# Patient Record
Sex: Female | Born: 1955 | Race: White | Hispanic: No | Smoking: Never smoker
Health system: Southern US, Community
[De-identification: ages and names within clinical notes are randomized; demographics above are authoritative.]

## PROBLEM LIST (undated history)

## (undated) DIAGNOSIS — N289 Disorder of kidney and ureter, unspecified: Secondary | ICD-10-CM

## (undated) DIAGNOSIS — R569 Unspecified convulsions: Secondary | ICD-10-CM

## (undated) DIAGNOSIS — M25551 Pain in right hip: Secondary | ICD-10-CM

## (undated) DIAGNOSIS — I10 Essential (primary) hypertension: Secondary | ICD-10-CM

## (undated) DIAGNOSIS — E119 Type 2 diabetes mellitus without complications: Secondary | ICD-10-CM

## (undated) DIAGNOSIS — Z8489 Family history of other specified conditions: Secondary | ICD-10-CM

## (undated) DIAGNOSIS — I639 Cerebral infarction, unspecified: Secondary | ICD-10-CM

## (undated) DIAGNOSIS — G8929 Other chronic pain: Secondary | ICD-10-CM

---

## 2017-04-25 ENCOUNTER — Encounter (HOSPITAL_COMMUNITY): Payer: Self-pay | Admitting: Emergency Medicine

## 2017-04-25 ENCOUNTER — Emergency Department (HOSPITAL_COMMUNITY): Payer: Medicaid Other

## 2017-04-25 ENCOUNTER — Telehealth: Payer: Self-pay

## 2017-04-25 ENCOUNTER — Emergency Department (HOSPITAL_COMMUNITY)
Admission: EM | Admit: 2017-04-25 | Discharge: 2017-04-25 | Disposition: A | Discharge status: Home / Self Care | Payer: Medicaid Other | Attending: Emergency Medicine | Admitting: Emergency Medicine

## 2017-04-25 DIAGNOSIS — J45909 Unspecified asthma, uncomplicated: Secondary | ICD-10-CM | POA: Diagnosis not present

## 2017-04-25 DIAGNOSIS — R519 Headache, unspecified: Secondary | ICD-10-CM

## 2017-04-25 DIAGNOSIS — R51 Headache: Secondary | ICD-10-CM | POA: Diagnosis not present

## 2017-04-25 DIAGNOSIS — I10 Essential (primary) hypertension: Secondary | ICD-10-CM | POA: Diagnosis not present

## 2017-04-25 DIAGNOSIS — F0390 Unspecified dementia without behavioral disturbance: Secondary | ICD-10-CM | POA: Insufficient documentation

## 2017-04-25 DIAGNOSIS — Z853 Personal history of malignant neoplasm of breast: Secondary | ICD-10-CM | POA: Diagnosis not present

## 2017-04-25 DIAGNOSIS — E119 Type 2 diabetes mellitus without complications: Secondary | ICD-10-CM | POA: Insufficient documentation

## 2017-04-25 LAB — COMPREHENSIVE METABOLIC PANEL
ALT: 14 U/L (ref 14–54)
AST: 24 U/L (ref 15–41)
Albumin: 4.3 g/dL (ref 3.5–5.0)
Alkaline Phosphatase: 75 U/L (ref 38–126)
Anion gap: 8 (ref 5–15)
BUN: 17 mg/dL (ref 6–20)
CHLORIDE: 104 mmol/L (ref 101–111)
CO2: 27 mmol/L (ref 22–32)
Calcium: 9.2 mg/dL (ref 8.9–10.3)
Creatinine, Ser: 0.73 mg/dL (ref 0.44–1.00)
GFR calc Af Amer: >60 mL/min (ref >60)
GFR calc non Af Amer: >60 mL/min (ref >60)
GLUCOSE: 81 mg/dL (ref 65–99)
POTASSIUM: 3.6 mmol/L (ref 3.5–5.1)
Sodium: 139 mmol/L (ref 135–145)
Total Bilirubin: 0.8 mg/dL (ref 0.3–1.2)
Total Protein: 7.6 g/dL (ref 6.5–8.1)

## 2017-04-25 LAB — CBC WITH DIFFERENTIAL/PLATELET
Basophils Absolute: 0.1 10*3/uL (ref 0.0–0.1)
Basophils Relative: 1 %
EOS PCT: 10 %
Eosinophils Absolute: 0.7 10*3/uL (ref 0.0–0.7)
HCT: 41.5 % (ref 36.0–46.0)
Hemoglobin: 14.7 g/dL (ref 12.0–15.0)
LYMPHS ABS: 1.8 10*3/uL (ref 0.7–4.0)
LYMPHS PCT: 26 %
MCH: 33.4 pg (ref 26.0–34.0)
MCHC: 35.4 g/dL (ref 30.0–36.0)
MCV: 94.3 fL (ref 78.0–100.0)
MONO ABS: 0.6 10*3/uL (ref 0.1–1.0)
MONOS PCT: 9 %
Neutro Abs: 3.6 10*3/uL (ref 1.7–7.7)
Neutrophils Relative %: 54 %
PLATELETS: 240 10*3/uL (ref 150–400)
RBC: 4.4 MIL/uL (ref 3.87–5.11)
RDW: 12.4 % (ref 11.5–15.5)
WBC: 6.8 10*3/uL (ref 4.0–10.5)

## 2017-04-25 LAB — SEDIMENTATION RATE: Sed Rate: 34 mm/hr — ABNORMAL HIGH (ref 0–22)

## 2017-04-25 MED ORDER — DIPHENHYDRAMINE HCL 50 MG/ML IJ SOLN
25.0000 mg | Freq: Once | INTRAMUSCULAR | Status: AC
  Administered 2017-04-25: 25 mg via INTRAVENOUS
  Filled 2017-04-25: qty 1

## 2017-04-25 MED ORDER — METOCLOPRAMIDE HCL 5 MG/ML IJ SOLN
10.0000 mg | INTRAMUSCULAR | Status: AC
  Administered 2017-04-25: 10 mg via INTRAVENOUS
  Filled 2017-04-25: qty 2

## 2017-04-25 NOTE — ED Provider Notes (Signed)
[facility] DEPT Provider Note   CSN: [id number] Arrival date & time: 04/25/17  0846     History   Chief Complaint Chief Complaint  Patient presents with  . Headache    HPI Lacey Cook is a 61 y.o. female.  61yo F w/ PMH including HTN, HLD, T2DM, asthma, breast cancer who p/w headache.  Patient states that she has been having intermittent headaches in her bilateral temples for the past week.  She has not noticed a pattern to the headaches.  She had an episode of vomiting 1 week ago.  She denies any vision changes or extremity weakness.  She states her skin has been turning black on her legs recently.  She denies any fevers, cough/cold symptoms, or recent illness.  She moved here from [place] several months ago and has been off of all medications since then because she cannot afford them.  She was diagnosed with left-sided breast cancer in [place] but never received treatment and has not yet established care with anyone here.  She lives with family here.  Daughter notes that she has a history of dementia.  She also states that the patient has complained of headaches in the past.  She confirms the patient has been off of her medications for a while.  LEVEL 5 CAVEAT DUE TO DEMENTIA   The history is provided by the patient and a relative.  Headache      History reviewed. No pertinent past medical history.  There are no active problems to display for this patient.   History reviewed. No pertinent surgical history.  OB History    No data available       Home Medications    Prior to Admission medications   Not on File    Family History History reviewed. No pertinent family history.  Social History Social History   Tobacco Use  . Smoking status: Never Smoker  . Smokeless tobacco: Never Used  Substance Use Topics  . Alcohol use: No    Frequency: Never  . Drug use: Not on file     Allergies   Sulfa antibiotics   Review of  Systems Review of Systems  Unable to perform ROS: Dementia  Neurological: Positive for headaches.     Physical Exam Updated Vital Signs BP 129/77   Pulse 88   Temp 97.8 F (36.6 C) (Oral)   Resp 16   SpO2 100%   Physical Exam  Constitutional: She appears well-developed and well-nourished. No distress.  Awake, alert  HENT:  Head: Normocephalic and atraumatic.  Eyes: Conjunctivae and EOM are normal. Pupils are equal, round, and reactive to light.  Neck: Neck supple.  Cardiovascular: Normal rate, regular rhythm and normal heart sounds.  No murmur heard. Pulmonary/Chest: Effort normal and breath sounds normal. No respiratory distress.  Abdominal: Soft. Bowel sounds are normal. She exhibits no distension. There is no tenderness.  Musculoskeletal: She exhibits edema (trace BLE).  Neurological: She is alert. She has normal reflexes. No cranial nerve deficit. She exhibits normal muscle tone.  Alert and oriented, occasionally contradicts herself during conversation and becomes confused with questions, Fluent speech, normal finger-to-nose testing, negative pronator drift, no clonus normal sensation x all 4 extremities 5/5 strength BUE 4/5 strength BLE  Skin: Skin is warm and dry.  No skin changes on legs  Psychiatric: Judgment and thought content normal.  Flat affect, calm and cooperative  Nursing note and vitals reviewed.    ED Treatments / Results  Labs (all  labs ordered are listed, but only abnormal results are displayed) Labs Reviewed  SEDIMENTATION RATE - Abnormal; Notable for the following components:      Result Value   Sed Rate 34 (*)    All other components within normal limits  COMPREHENSIVE METABOLIC PANEL  CBC WITH DIFFERENTIAL/PLATELET    EKG  EKG Interpretation None       Radiology Ct Head Wo Contrast  Result Date: 04/25/2017 CLINICAL DATA:  Bilateral temporal headache for the past week. Mild tremors. EXAM: CT HEAD WITHOUT CONTRAST TECHNIQUE:  Contiguous axial images were obtained from the base of the skull through the vertex without intravenous contrast. COMPARISON:  None. FINDINGS: Brain: No evidence of acute infarction, hemorrhage, extra-axial collection, ventriculomegaly, or mass effect. Generalized cerebral atrophy. Slightly increased left frontal lobe volume loss likely reflecting sequela prior infarct. Periventricular white matter low attenuation likely secondary to microangiopathy. Vascular: No focal abnormality.  No hyperdense vessels. Skull: Negative for fracture or focal lesion. Sinuses/Orbits: Visualized portions of the orbits are unremarkable. Visualized portions of the paranasal sinuses and mastoid air cells are unremarkable. Other: None. IMPRESSION: 1. No acute intracranial pathology. Electronically Signed   By: Kathreen Devoid   On: 04/25/2017 11:03    Procedures Procedures (including critical care time)  Medications Ordered in ED Medications  diphenhydrAMINE (BENADRYL) injection 25 mg (25 mg Intravenous Given 04/25/17 1059)  metoCLOPramide (REGLAN) injection 10 mg (10 mg Intravenous Given 04/25/17 1058)     Initial Impression / Assessment and Plan / ED Course  I have reviewed the triage vital signs and the nursing notes.  Pertinent labs & imaging results that were available during my care of the patient were reviewed by me and considered in my medical decision making (see chart for details).     PT w/ multiple medical problems including recent diagnosis of breast cancer without any treatment yet presents with intermittent headaches for the past week.  Her neurologic exam was normal with the exception of intermittent confusion and occasional contradictory statements during exam.  For example, I asked her if she had any problems with cholesterol or lung issues and she initially denied but then she later stated she had cholesterol problems and asthma.  I discussed with her daughter who confirms that this is consistent with  her known dementia.  I contacted case management to assist with getting the patient a primary care provider and to get her connected with the oncology team here. Case manager has obtained records from [place] and has arranged for PCP appointment this week.    CBC and CMP are unremarkable, ESR is elevated at 34. Given that ESR is <40 and pt has h/o headaches per daughter, I doubt temporal arteritis especially given complete resolution after migraine cocktail and no visual symptoms.   LAbwork unremarkable. Her head CT was negative for mass or other acute process and given her reassuring physical exam I feel she is safe for outpatient follow-up.  I have reviewed return precautions with the patient and her daughter.  Final Clinical Impressions(s) / ED Diagnoses   Final diagnoses:  Intermittent headache  Dementia without behavioral disturbance, unspecified dementia type    ED Discharge Orders    None       Averyanna Sax, Wenda Overland, MD 04/25/17 1640

## 2017-04-25 NOTE — ED Notes (Signed)
Patient transported to CT 

## 2017-04-25 NOTE — Telephone Encounter (Signed)
Call received from Haze Justin, RN CM requesting a hospital follow up appointment for the patient at [facility]. An appointment was scheduled for 04/28/17 @ 0945.  As per Levada Dy, the patient does not have a phone # and can be reached through her daughter, Suanne Marker.

## 2017-04-25 NOTE — Discharge Instructions (Signed)
1. Call [facility] for first available appointment for primary care doctor. 2. Call oncology clinic for next available appointment for  your breast cancer. 3. Return to ER if you have confusion, fever, vomiting, balance problems, weakness, or speech problems.

## 2017-04-25 NOTE — ED Triage Notes (Addendum)
Pt report she has a bilateral temporal HA for the past week accompanied by mild tremors when moving her hands. No head injury. No unilateral arm weakness or speech difficulties. Also having intermittent emesis with HA.

## 2017-04-25 NOTE — Care Management Note (Signed)
Case Management Note  Patient Details  Name: Lacey Cook MRN: [id number] Date of Birth: 04/09/1956  Subjective/Objective:                  60yo F w/ PMH including HTN, HLD, T2DM, asthma, TBI, seizures, CVA x2, breast cancer who p/w headache.  Action/Plan: CM consulted for no pcp and no ins on pt who needs multiple follow ups.  Pt is a poor historian with recall, but if a name or place is mentioned, pt can confirm or deny if it apply's to her.    CM spoke with pts daughter who states after multiple [facility] admissions in [place], she moved the pt in with her several months prior to moving to [place] this past July.  CM is unsure of why there was no continued care for the pt since at that time.  Pt's daughter stated she did not know who the pt's PCP was and offered multiple hospital systems the pt may have possibly been at.    CM spoke with pt who provided several old business cards for a DME company called [employer] and for [facility].  CM contacted both locations with release of information signed and was able to obtain pt's PCP name.  Pt confirmed that Gilberto Better, MD was her doctor at [facility].  CM contacted and faxed release of information to Dr. Randell Patient office.  [facility] is on Epic but care everywhere was not working at time of attempt.  CM obtained an appointment for pt Mon, Dec 3 at 9:45 am at the [facility].  Information placed and highlighted on AVS for pt.  Pt's daughter is aware, though she is being admitted to the hospital at this time.  CM Jane at [facility] has been updated on the pt and release of information obtained from other offices have been faxed to their office for the appointment on Monday.  CM contacted Seth Bake for a [facility] referral and was advised that if no information on the pt is available s/p oncology, the pt will have to start from the beginning of the process again through her PCP with a mammogram, then go  to a surgeon, prior to going to the cancer center.  This information was not provided to the pt due to CM being informed after pt was D/C.  Information for the on-call oncologist was placed on her AVS.  Expected Discharge Date:   04/25/2017               Expected Discharge Plan:  Home/Self Care  Discharge planning Services  CM Consult, [facility], Follow-up appt scheduled, Other - See comment  Choice offered to:  Patient, Adult Children  Status of Service:  Completed, signed off  TRUE Garciamartinez, Benjaman Lobe, RN 04/25/2017, 2:57 PM

## 2017-04-28 ENCOUNTER — Inpatient Hospital Stay: Payer: Self-pay | Admitting: Family Medicine

## 2017-04-28 ENCOUNTER — Telehealth: Payer: Self-pay

## 2017-04-28 NOTE — Telephone Encounter (Signed)
This note was opened in error.

## 2017-07-26 ENCOUNTER — Emergency Department (HOSPITAL_COMMUNITY)
Admission: EM | Admit: 2017-07-26 | Discharge: 2017-07-26 | Disposition: A | Discharge status: Home / Self Care | Payer: Medicaid Other | Attending: Emergency Medicine | Admitting: Emergency Medicine

## 2017-07-26 ENCOUNTER — Emergency Department (HOSPITAL_COMMUNITY): Payer: Medicaid Other

## 2017-07-26 ENCOUNTER — Encounter (HOSPITAL_COMMUNITY): Payer: Self-pay | Admitting: Emergency Medicine

## 2017-07-26 DIAGNOSIS — E119 Type 2 diabetes mellitus without complications: Secondary | ICD-10-CM | POA: Insufficient documentation

## 2017-07-26 DIAGNOSIS — R059 Cough, unspecified: Secondary | ICD-10-CM

## 2017-07-26 DIAGNOSIS — M25551 Pain in right hip: Secondary | ICD-10-CM | POA: Diagnosis not present

## 2017-07-26 DIAGNOSIS — K0889 Other specified disorders of teeth and supporting structures: Secondary | ICD-10-CM | POA: Diagnosis not present

## 2017-07-26 DIAGNOSIS — R05 Cough: Secondary | ICD-10-CM | POA: Diagnosis present

## 2017-07-26 DIAGNOSIS — I10 Essential (primary) hypertension: Secondary | ICD-10-CM | POA: Diagnosis not present

## 2017-07-26 DIAGNOSIS — R911 Solitary pulmonary nodule: Secondary | ICD-10-CM | POA: Diagnosis not present

## 2017-07-26 HISTORY — DX: Type 2 diabetes mellitus without complications: E11.9

## 2017-07-26 HISTORY — DX: Unspecified convulsions: R56.9

## 2017-07-26 HISTORY — DX: Cerebral infarction, unspecified: I63.9

## 2017-07-26 HISTORY — DX: Disorder of kidney and ureter, unspecified: N28.9

## 2017-07-26 HISTORY — DX: Essential (primary) hypertension: I10

## 2017-07-26 MED ORDER — ALBUTEROL SULFATE HFA 108 (90 BASE) MCG/ACT IN AERS
1.0000 | INHALATION_SPRAY | Freq: Once | RESPIRATORY_TRACT | Status: AC
  Administered 2017-07-26: 1 via RESPIRATORY_TRACT
  Filled 2017-07-26: qty 6.7

## 2017-07-26 MED ORDER — FLUTICASONE PROPIONATE 50 MCG/ACT NA SUSP: 1.0000 | Freq: Every day | NASAL | 2 refills | Status: DC

## 2017-07-26 MED ORDER — ACETAMINOPHEN 325 MG PO TABS
650.0000 mg | ORAL_TABLET | Freq: Once | ORAL | Status: AC
  Administered 2017-07-26: 650 mg via ORAL
  Filled 2017-07-26: qty 2

## 2017-07-26 MED ORDER — PENICILLIN V POTASSIUM 500 MG PO TABS: 500.0000 mg | ORAL_TABLET | Freq: Three times a day (TID) | ORAL | 0 refills | Status: AC

## 2017-07-26 MED ORDER — AEROCHAMBER PLUS FLO-VU MEDIUM MISC
1.0000 | Freq: Once | Status: AC
  Administered 2017-07-26: 1
  Filled 2017-07-26: qty 1

## 2017-07-26 MED ORDER — CETIRIZINE HCL 10 MG PO TABS: 10.0000 mg | ORAL_TABLET | Freq: Every day | ORAL | 0 refills | Status: DC

## 2017-07-26 NOTE — ED Notes (Signed)
Patient transported to X-ray 

## 2017-07-26 NOTE — ED Triage Notes (Signed)
Patient c/o upper dental pain x3 months. States she does not have a dentist. Patient also c/o right hip pain x3 years. Hx arthritis. Patient also reports non productive cough x2 weeks. Denies N/V/D. Reports being around other with similar sx.

## 2017-07-26 NOTE — Discharge Instructions (Signed)
Please read and follow all provided instructions.  Your diagnoses today include:  1. Cough   2. Pulmonary nodule   3. Pain, dental   4. Pain of right hip joint     You appear to have an upper respiratory infection (URI). An upper respiratory tract infection, or cold, is a viral infection of the air passages leading to the lungs. It should improve gradually after 5-7 days. You may have a lingering cough that lasts for 2- 4 weeks after the infection.  It is important that she follow-up with the [facility] regarding the nodule we noted on the chest x-ray of the left upper lung.  This will need to be imaged with a CAT scan.  This can be done outpatient.  I have Artie discussed with case management you following up with [facility].  Please call the number I listed in this paperwork first thing Monday morning to make your appointment.  Tests performed today include: Vital signs. See below for your results today.   Medications prescribed:   Take any prescribed medications only as directed. Treatment for your infection is aimed at treating the symptoms. There are no medications, such as antibiotics, that will cure your infection.   Home care instructions:  Follow any educational materials contained in this packet.   Your illness is contagious and can be spread to others, especially during the first 3 or 4 days. It cannot be cured by antibiotics or other medicines. Take basic precautions such as washing your hands often, covering your mouth when you cough or sneeze, and avoiding public places where you could spread your illness to others.   Please continue drinking plenty of fluids.  Use over-the-counter medicines as needed as directed on packaging for symptom relief.  You may also use ibuprofen or tylenol as directed on packaging for pain or fever.  Do not take multiple medicines containing Tylenol or acetaminophen to avoid taking too much of this  medication.  Follow-up instructions: Please follow-up with your primary care provider in the next 3 days for further evaluation of your symptoms if you are not feeling better.   Return instructions:  Please return to the Emergency Department if you experience worsening symptoms.  RETURN IMMEDIATELY IF you develop shortness of breath, chest pain, confusion or altered mental status, a new rash, become dizzy, faint, or poorly responsive, or are unable to be cared for at home. Please return if you have persistent vomiting and cannot keep down fluids or develop a fever that is not controlled by tylenol or motrin.   For your dental pain, please return the emergency department for any swelling of the face, difficulty breathing or swallowing, or a mass developing on the tooth that is concerning for abscess. Please return if you have any redness or swelling of your right hip suggestive of an infection.  Additional Information:  Your vital signs today were: BP 128/68    Pulse 74    Temp 98.1 F (36.7 C) (Oral)    Resp 18    SpO2 96%  If your blood pressure (BP) was elevated above 135/85 this visit, please have this repeated by your doctor within one month. --------------

## 2017-07-26 NOTE — ED Provider Notes (Signed)
[name] DEPT Provider Note   CSN: [id number] Arrival date & time: 07/26/17  1441     History   Chief Complaint Chief Complaint  Patient presents with  . Dental Pain  . Hip Pain  . Cough    HPI Lacey Cook is a 62 y.o. female.  HPI   Patient is a 62 year old female with a history of diabetes mellitus, hypertension, seizures, and CVA presenting for dental pain, right hip pain, and nonproductive cough for 2 weeks.  Patient presents with her granddaughter, who assists in history.  Patient reports that her memory is poor with her health problems, as she has had "2 strokes".   Cough:   Patient reports she has had a cough for approximately 2 weeks.  Patient reports it is nonproductive.  No hemoptysis.  Patient denies any fever or chills with her symptoms.  Patient does report she gets more short of breath while lying down, but is not particular short of breath associated with this cough.  Patient also reporting she has associated nasal congestion and rhinorrhea.  No remedies tried for symptoms.  Patient is not a smoker, and has never smoked, however has a history of being around secondhand smoke.  Dental Pain:   Patient reporting persistent, gradually worsening, right-sided, upper dental pain beginning 3 months ago. Pt describes their pain as  throbbing.  Pain is not particularly worsening in nature over this interval.  Pt has been taking Aleve at home with minimal relief of pain. Pain is exacerbated by chewing. They are followed by dentistry.  Pt denies facial swelling, fever, chills, difficulty breathing, difficulty swallowing.  Patient has upcoming dental appointment per daughter.  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  Patient reports that over approximately last 3 months she has had increasing pain in the right hip.  It is aching in nature, and patient finds it is most painful when she is trying to  get up or sit down.  Patient reports she has difficulty standing up due to the pain.  Patient reports it radiates from the buttock around to her hip.  Patient also reporting she has right knee pain worsening over the similar interval.  Patient denies any erythematous or edematous joints.  Patient denies fever or chills.  Past Medical History:  Diagnosis Date  . Diabetes mellitus without complication ([place])   . Hypertension   . Renal disorder   . Seizures ([place])   . Stroke [facility])     There are no active problems to display for this patient.   History reviewed. No pertinent surgical history.  OB History    No data available       Home Medications    Prior to Admission medications   Not on File    Family History No family history on file.  Social History Social History   Tobacco Use  . Smoking status: Never Smoker  . Smokeless tobacco: Never Used  Substance Use Topics  . Alcohol use: No    Frequency: Never  . Drug use: Not on file     Allergies   Sulfa antibiotics   Review of Systems Review of Systems  Constitutional: Negative for chills and fever.  HENT: Positive for congestion, dental problem and rhinorrhea. Negative for facial swelling, trouble swallowing and voice change.   Respiratory: Positive for cough and shortness of breath. Negative for chest tightness and stridor.   Cardiovascular: Negative for chest  pain and leg swelling.  Gastrointestinal: Negative for abdominal pain, nausea and vomiting.  Musculoskeletal: Positive for arthralgias and gait problem. Negative for joint swelling.  Neurological: Negative for weakness.     Physical Exam Updated Vital Signs BP 133/70 (BP Location: Left Arm)   Pulse 72   Temp 98.1 F (36.7 C) (Oral)   Resp 18   SpO2 96%   Physical Exam  Constitutional: She appears well-developed and well-nourished. No distress.  Resting comfortably in examination chair.  HENT:  Head: Normocephalic and atraumatic.    Mouth/Throat: Oropharynx is clear and moist.  Dental cavities and poor oral dentition noted.  Erosions and caries noted in the right upper molars.  Pain along tooth as depicted in image. No abscess noted. Midline uvula. No trismus. OP clear and moist. No oropharyngeal erythema or edema. Neck supple with no tenderness. No facial edema.  Eyes: Conjunctivae and EOM are normal. Pupils are equal, round, and reactive to light.  Neck: Normal range of motion. Neck supple.  No obvious masses palpated of supraclavicular region.  Cardiovascular: Normal rate, regular rhythm, S1 normal and S2 normal.  No murmur heard. Pulmonary/Chest: Effort normal and breath sounds normal. She has no wheezes. She has no rales.  Abdominal: She exhibits no distension.  Musculoskeletal: Normal range of motion. She exhibits no edema or deformity.  Examination of right hip performed with nurse chaperone present.  There is pain to palpation of the greater trochanter of the right hip.  Pain over sacroiliac joint.  No erythema or edema noted.  Lymphadenopathy:    She has no cervical adenopathy.  Neurological: She is alert.  Cranial nerves grossly intact. Patient moves extremities symmetrically and with good coordination. Antalgic gait with favoring the right.  Skin: Skin is warm and dry. No rash noted. No erythema.  Psychiatric:  Patient exhibiting memory difficulties.  Nursing note and vitals reviewed.    ED Treatments / Results  Labs (all labs ordered are listed, but only abnormal results are displayed) Labs Reviewed - No data to display  EKG  EKG Interpretation None       Radiology Dg Chest 2 View  Result Date: 07/26/2017 CLINICAL DATA:  Cough.  Right hip pain. EXAM: CHEST  2 VIEW COMPARISON:  None. FINDINGS: Normal cardiac and mediastinal contours. No consolidative pulmonary opacities. No pleural effusion or pneumothorax. Thoracic spine degenerative changes. Within the left upper hemithorax there is a 1.6 cm  nodular density which may potentially be pulmonary or osseous in etiology. IMPRESSION: Indeterminate 1.6 cm nodular density projecting within the left upper hemithorax, potentially pulmonary or osseous in etiology. In the nonacute setting, recommend further evaluation with chest CT. No acute cardiopulmonary process. Electronically Signed   By: Lovey Newcomer M.D.   On: 07/26/2017 17:15   Dg Knee Complete 4 Views Right  Result Date: 07/26/2017 CLINICAL DATA:  Right hip pain for 3 years.  Right knee pain. EXAM: RIGHT KNEE - COMPLETE 4+ VIEW COMPARISON:  None. FINDINGS: No acute fracture or dislocation. Generalized osteopenia. Severe medial femorotibial compartment joint space narrowing marginal osteophytes and subchondral reactive changes. Mild lateral femorotibial compartment joint space narrowing with small marginal osteophytes. Patellofemoral compartment joint space narrowing and marginal osteophytes. No significant joint effusion. No aggressive osseous lesion. Soft tissues are normal. IMPRESSION: 1.  No acute osseous injury of the right knee. 2. Tricompartmental osteoarthritis of the right knee most severe in the medial femorotibial compartment. Electronically Signed   By: Kathreen Devoid   On: 07/26/2017 17:17  Dg Hip Unilat W Or Wo Pelvis 2-3 Views Right  Result Date: 07/26/2017 CLINICAL DATA:  Patient with right hip pain for 3 years. EXAM: DG HIP (WITH OR WITHOUT PELVIS) 2-3V RIGHT COMPARISON:  None. FINDINGS: Marked right hip joint degenerative changes with joint space loss and subchondral sclerosis/cystic change. Rim osteophytes. Mild left hip joint degenerative changes. No acute osseous abnormality. IMPRESSION: Marked right hip joint degenerative changes. Electronically Signed   By: Lovey Newcomer M.D.   On: 07/26/2017 17:17    Procedures Procedures (including critical care time)  Medications Ordered in ED Medications  acetaminophen (TYLENOL) tablet 650 mg (650 mg Oral Given 07/26/17 1711)  albuterol  (PROVENTIL HFA;VENTOLIN HFA) 108 (90 Base) MCG/ACT inhaler 1 puff (1 puff Inhalation Given 07/26/17 1711)  AEROCHAMBER PLUS FLO-VU MEDIUM MISC 1 each (1 each Other Given 07/26/17 1712)     Initial Impression / Assessment and Plan / ED Course  I have reviewed the triage vital signs and the nursing notes.  Pertinent labs & imaging results that were available during my care of the patient were reviewed by me and considered in my medical decision making (see chart for details).  Clinical Course as of Jul 27 1818  Sat Jul 26, 2017  1756 DG Hip Unilat W or Wo Pelvis 2-3 Views Right [AM]    Clinical Course User Index [AM] Albesa Seen, [place]    Patient is nontoxic-appearing, afebrile, and normotensive, and not hypoxic.  Suspect that cough is viral in nature.  Patient and her granddaughter reporting that this is the primary reason for presentation to the emergency department today.  There are no focal infiltrates on chest x-ray to suggest pneumonia.  There is a 1.6 cm nodular opacity in the  left upper lung.  No prior chest x-rays to determine the nature of this.  This was discussed with patient as well as her granddaughter and her daughter over the phone.  Per radiology, patient will require nonemergent CT of the chest.  Patient was to obtain follow-up at [facility] and there is documentation by CSW's in the chart of this, however patient did not have a working phone number in the chart.  This was amended and her daughter's phone number placed in the chart.  I spoke with case manager, who will get in touch with [facility] regarding the patient's upcoming follow-up.  Patient and her granddaughter instructed to call [facility] set up appointment.   No dental abscess requiring immediate incision and drainage. Patient is afebrile, non toxic appearing, and swallowing secretions well. Exam not concerning for Ludwig's angina or pharyngeal abscess.  I provided dental resource guide and stressed the importance of dental follow up for ultimate management of dental pain. Patient voices understanding and is agreeable to plan.  Right hip and right knee demonstrate extensive osteoarthritis.  No evidence of avascular necrosis.  No clinical evidence of septic arthritis.  Patient given Ace wrap for knee.  Will have patient follow-up with primary care regarding management of osteoarthritis.  Tylenol administered in emergency department for analgesia.  This is a supervised visit with Dr. Quintella Reichert. Evaluation, management, and discharge planning discussed with this attending physician.   Final Clinical Impressions(s) / ED Diagnoses   Final diagnoses:  Pulmonary nodule  Cough  Pain, dental  Pain of right hip joint    ED Discharge Orders    None       Albesa Seen, [place] 07/26/17 1834  Quintella Reichert, MD 07/27/17 [phone]

## 2017-07-26 NOTE — Progress Notes (Addendum)
Pt is followed at the [facility] TCC program. Sent message to [facility] T[facility] RN Coordinator to call Monday and arrange appt with daughter. Jonnie Finner RN CCM Case Mgmt phone [phone]

## 2017-07-28 ENCOUNTER — Telehealth: Payer: Self-pay

## 2017-07-28 NOTE — Telephone Encounter (Signed)
Message received from Jonnie Finner, RN CM requesting this CM contact the patient's daughter to schedule a hospital follow up appointment. Call placed to [facility], # [phone] and a HIPAA compliant voicemail message was left requesting a call back to # [phone].   Update provided to A. Marcheta Grammes, RN CM

## 2017-07-31 ENCOUNTER — Telehealth: Payer: Self-pay | Admitting: General Practice

## 2017-07-31 NOTE — Telephone Encounter (Signed)
Call placed to patient's daughter, Andrez Grime #[phone], regarding a hospital follow. Spoke with Suanne Marker and she stated that she had received [facility] case Freight forwarder) message. She apologized for not calling sooner but agreed to schedule an appointment for her mother at [place]. Appointment was scheduled for 08/05/17 @ 9:45am.

## 2017-08-05 ENCOUNTER — Inpatient Hospital Stay: Payer: Medicaid Other | Admitting: Family Medicine

## 2017-12-06 ENCOUNTER — Emergency Department (HOSPITAL_COMMUNITY)
Admission: EM | Admit: 2017-12-06 | Discharge: 2017-12-07 | Disposition: A | Discharge status: Home / Self Care | Payer: Medicaid Other | Attending: Emergency Medicine | Admitting: Emergency Medicine

## 2017-12-06 ENCOUNTER — Encounter (HOSPITAL_COMMUNITY): Payer: Self-pay | Admitting: Emergency Medicine

## 2017-12-06 DIAGNOSIS — E119 Type 2 diabetes mellitus without complications: Secondary | ICD-10-CM | POA: Insufficient documentation

## 2017-12-06 DIAGNOSIS — Z008 Encounter for other general examination: Secondary | ICD-10-CM

## 2017-12-06 DIAGNOSIS — Z8673 Personal history of transient ischemic attack (TIA), and cerebral infarction without residual deficits: Secondary | ICD-10-CM | POA: Insufficient documentation

## 2017-12-06 DIAGNOSIS — F4329 Adjustment disorder with other symptoms: Secondary | ICD-10-CM | POA: Diagnosis present

## 2017-12-06 DIAGNOSIS — G309 Alzheimer's disease, unspecified: Secondary | ICD-10-CM | POA: Insufficient documentation

## 2017-12-06 DIAGNOSIS — R41840 Attention and concentration deficit: Secondary | ICD-10-CM | POA: Diagnosis not present

## 2017-12-06 DIAGNOSIS — Z79899 Other long term (current) drug therapy: Secondary | ICD-10-CM | POA: Diagnosis not present

## 2017-12-06 DIAGNOSIS — I10 Essential (primary) hypertension: Secondary | ICD-10-CM | POA: Diagnosis not present

## 2017-12-06 DIAGNOSIS — Z0489 Encounter for examination and observation for other specified reasons: Secondary | ICD-10-CM | POA: Diagnosis present

## 2017-12-06 LAB — CBC WITH DIFFERENTIAL/PLATELET
BASOS PCT: 2 %
Basophils Absolute: 0.1 10*3/uL (ref 0.0–0.1)
EOS ABS: 0.2 10*3/uL (ref 0.0–0.7)
Eosinophils Relative: 4 %
HCT: 39.9 % (ref 36.0–46.0)
Hemoglobin: 13.4 g/dL (ref 12.0–15.0)
Lymphocytes Relative: 35 %
Lymphs Abs: 2 10*3/uL (ref 0.7–4.0)
MCH: 31.8 pg (ref 26.0–34.0)
MCHC: 33.6 g/dL (ref 30.0–36.0)
MCV: 94.5 fL (ref 78.0–100.0)
MONO ABS: 0.3 10*3/uL (ref 0.1–1.0)
Monocytes Relative: 5 %
NEUTROS PCT: 54 %
Neutro Abs: 3.1 10*3/uL (ref 1.7–7.7)
PLATELETS: 227 10*3/uL (ref 150–400)
RBC: 4.22 MIL/uL (ref 3.87–5.11)
RDW: 12.7 % (ref 11.5–15.5)
WBC: 5.7 10*3/uL (ref 4.0–10.5)

## 2017-12-06 LAB — URINALYSIS, ROUTINE W REFLEX MICROSCOPIC
Bilirubin Urine: NEGATIVE
GLUCOSE, UA: NEGATIVE mg/dL
Hgb urine dipstick: NEGATIVE
KETONES UR: NEGATIVE mg/dL
NITRITE: NEGATIVE
PROTEIN: NEGATIVE mg/dL
Specific Gravity, Urine: 1.024 (ref 1.005–1.030)
pH: 5 (ref 5.0–8.0)

## 2017-12-06 LAB — RAPID URINE DRUG SCREEN, HOSP PERFORMED
Amphetamines: NOT DETECTED
Benzodiazepines: NOT DETECTED
Cocaine: NOT DETECTED
Opiates: NOT DETECTED
Tetrahydrocannabinol: NOT DETECTED

## 2017-12-06 LAB — COMPREHENSIVE METABOLIC PANEL
ALK PHOS: 62 U/L (ref 38–126)
ALT: 12 U/L (ref 0–44)
AST: 18 U/L (ref 15–41)
Albumin: 3.5 g/dL (ref 3.5–5.0)
Anion gap: 8 (ref 5–15)
BILIRUBIN TOTAL: 0.5 mg/dL (ref 0.3–1.2)
BUN: 13 mg/dL (ref 6–20)
CALCIUM: 8.9 mg/dL (ref 8.9–10.3)
CO2: 27 mmol/L (ref 22–32)
Chloride: 110 mmol/L (ref 98–111)
Creatinine, Ser: 0.64 mg/dL (ref 0.44–1.00)
GLUCOSE: 115 mg/dL — AB (ref 70–99)
Potassium: 3.4 mmol/L — ABNORMAL LOW (ref 3.5–5.1)
Sodium: 145 mmol/L (ref 135–145)
TOTAL PROTEIN: 6.5 g/dL (ref 6.5–8.1)

## 2017-12-06 LAB — ACETAMINOPHEN LEVEL

## 2017-12-06 LAB — ETHANOL

## 2017-12-06 MED ORDER — ACETAMINOPHEN 325 MG PO TABS: 650.0000 mg | ORAL_TABLET | ORAL | Status: DC | PRN

## 2017-12-06 NOTE — ED Triage Notes (Addendum)
Pt arrived via [facility] under IVC. "My daughter doesn't think I give a damn about her. She treats me bad". Patient denies suicidal or homicidal ideations. "My daughter sent me here because she doesn't want me to live at my boyfriend John's house".

## 2017-12-06 NOTE — ED Provider Notes (Signed)
[facility] DEPT Provider Note   CSN: [id number] Arrival date & time: 12/06/17  1902     History   Chief Complaint Chief Complaint  Patient presents with  . Medical Clearance    HPI Lacey Cook is a 62 y.o. female.  HPI Patient is brought to the emergency department and IVC status.  Patient is daughter took out the paperwork.  Per the IVC paperwork, patient is refusing her medication and not caring for herself.  PCP for work indicates patient has Alzheimer's dementia.  Patient reports that she is here because her daughter is jealous because she is sleeping with her ex.  Patient reiterates in response to many questions "sleeping with my ex" and then smiles coyly.  I asked the patient where she lives, she reports she lives with her "ex".  I asked her what activity she pursues in the day, whether she works, has people with whom she visits or activities and her responses "sleeping with ex".  Patient reports she is been well and is denying all positives on review of systems.  When I asked her what hospital she is that she says "I do not know honey" and laughs.  Same for the date. Past Medical History:  Diagnosis Date  . Diabetes mellitus without complication ([place])   . Hypertension   . Renal disorder   . Seizures ([place])   . Stroke [facility])     There are no active problems to display for this patient.   History reviewed. No pertinent surgical history.   OB History   None      Home Medications    Prior to Admission medications   Medication Sig Start Date End Date Taking? Authorizing Provider  fluticasone (FLONASE) 50 MCG/ACT nasal spray Place 1 spray into both nostrils daily. 07/26/17  Yes Murray, Alyssa B, PA-C  Multiple Vitamins-Minerals (MULTIVITAMIN ADULT PO) Take 1 tablet by mouth.   Yes [provider]  cetirizine (ZYRTEC) 10 MG tablet Take 1 tablet (10 mg total) by mouth daily. 07/26/17 08/25/17  Albesa Seen, PA-C    Family  History History reviewed. No pertinent family history.  Social History Social History   Tobacco Use  . Smoking status: Never Smoker  . Smokeless tobacco: Never Used  Substance Use Topics  . Alcohol use: No    Frequency: Never  . Drug use: Never     Allergies   Shellfish allergy and Sulfa antibiotics   Review of Systems Review of Systems 10 Systems reviewed and are negative for acute change except as noted in the HPI.  Physical Exam Updated Vital Signs BP 126/75   Pulse (!) 108   Temp 99.3 F (37.4 C) (Oral)   Resp 20   Ht 5\' 2"  (1.575 m)   Wt 81.6 kg (180 lb)   SpO2 97%   BMI 32.92 kg/m   Physical Exam  Constitutional:  Patient is alert and nontoxic.  She is Surveyor, quantity.  HENT:  Head: Normocephalic and atraumatic.  Nose: Nose normal.  Mouth/Throat: Oropharynx is clear and moist.  Eyes: EOM are normal.  Neck: Neck supple.  Cardiovascular: Normal rate, regular rhythm, normal heart sounds and intact distal pulses.  Pulmonary/Chest: Effort normal and breath sounds normal.  Abdominal: Soft. Bowel sounds are normal.  Musculoskeletal: Normal range of motion. She exhibits no edema or tenderness.  Neurological: She is alert. She exhibits normal muscle tone. Coordination normal.  Skin: Skin is warm and dry.  Psychiatric:  Mood is elevated.  Patient laughs frequently.     ED Treatments / Results  Labs (all labs ordered are listed, but only abnormal results are displayed) Labs Reviewed  COMPREHENSIVE METABOLIC PANEL - Abnormal; Notable for the following components:      Result Value   Potassium 3.4 (*)    Glucose, Bld 115 (*)    All other components within normal limits  ACETAMINOPHEN LEVEL - Abnormal; Notable for the following components:   Acetaminophen (Tylenol), Serum <10 (*)    All other components within normal limits  URINALYSIS, ROUTINE W REFLEX MICROSCOPIC - Abnormal; Notable for the following components:   APPearance HAZY (*)     Leukocytes, UA MODERATE (*)    Bacteria, UA FEW (*)    All other components within normal limits  RAPID URINE DRUG SCREEN, HOSP PERFORMED - Abnormal; Notable for the following components:   Barbiturates   (*)    Value: Result not available. Reagent lot number recalled by manufacturer.   All other components within normal limits  URINE CULTURE  ETHANOL  CBC WITH DIFFERENTIAL/PLATELET    EKG EKG Interpretation  Date/Time:  Saturday December 06 2017 21:06:41 EDT Ventricular Rate:  72 PR Interval:  174 QRS Duration: 102 QT Interval:  428 QTC Calculation: 468 R Axis:   -29 Text Interpretation:  Normal sinus rhythm Normal ECG agree. no ld comparison Confirmed by Charlesetta Shanks [phone]) on 12/06/2017 10:17:42 PM   Radiology No results found.  Procedures Procedures (including critical care time)  Medications Ordered in ED Medications  acetaminophen (TYLENOL) tablet 650 mg (has no administration in time range)     Initial Impression / Assessment and Plan / ED Course  I have reviewed the triage vital signs and the nursing notes.  Pertinent labs & imaging results that were available during my care of the patient were reviewed by me and considered in my medical decision making (see chart for details).      Final Clinical Impressions(s) / ED Diagnoses   Final diagnoses:  Medical clearance for psychiatric admission  Dementia with behavioral disturbance, unspecified dementia type   She is alert and in no distress.  She is brought in IVC by the patient's daughter.  Patient does exhibit signs of dementia or possibly psychosis.  Diagnostic work-up within normal limits.  Patient has a contaminated.  Urine specimen, will add a urine culture and not start empiric treatment at this time.  No signs of active medical problems at this time.  Patient is medically cleared for TTS consulted for dementia with behavior disturbance in geriatric patient.  ED Discharge Orders    None        Charlesetta Shanks, MD 12/06/17 2220

## 2017-12-06 NOTE — ED Notes (Signed)
Patient laughing appropriately with staff and has a bright affect. Patient states she does not feel as if she needs to be here.

## 2017-12-07 ENCOUNTER — Other Ambulatory Visit: Payer: Self-pay

## 2017-12-07 DIAGNOSIS — F4329 Adjustment disorder with other symptoms: Secondary | ICD-10-CM | POA: Diagnosis present

## 2017-12-07 LAB — CBG MONITORING, ED: Glucose-Capillary: 86 mg/dL (ref 70–99)

## 2017-12-07 NOTE — ED Notes (Signed)
TTS AT BEDSIDE 

## 2017-12-07 NOTE — BH Assessment (Addendum)
Tele Assessment Note   Patient Name: Lacey Cook MRN: [id number] Referring Physician: Charlesetta Shanks, MD Location of Patient: [facility], [phone] Location of Provider: [place] is an 62 y.o. female who presents unaccompanied to [facility] after being petitioned for involuntary commitment by her daughter, Patt Steinhardt [phone]. Affidavit and petition states: "Respondent has been diagnosed with dementia and Alzheimer's. Respondent does not take any of the medication that has been prescribed. Respondent repeats herself, forgets things and does not clean herself. Respondent has been drinking alcohol."  When asked why Pt was brought to the ED Pt states, "Because my daughter is an idiot." Pt states she has not felt well lately because of her daughter. Pt told EDP that her daughter is jealous because she is sleeping with her ex. Pt denies depressive symptoms but does report decreased concentration. She denies problems with sleep or appetite. She denies current suicidal ideation or history of suicide attempts. She denies thoughts of wanting to harm others. She denies history of aggression. She reports she occasionally drinks alcohol and denies other substance use. Pt's urine drug screen is negative and blood alcohol level is less than five.  Pt appears to have deficits in short and long-term memory. She give inconsistent answers to different providers. She says she is on a lot of medications and also reports she isn't prescribed medication. She reports she lives with her boyfriend and says he is supportive but later says no one in her life is supportive. She denies having a primary care physician. She denies any history of mental health treatment. Pt walks with a cane due to having a hip replacement.  Pt is dressed in hospital scrubs and her hygiene and grooming appear good. She is alert and oriented to person, place and situation but not date or time. Pt  speaks in a clear tone, at moderate volume and normal pace. Motor behavior appears normal. Eye contact is good. Pt's mood is at times happy or irritable and affect is labile. Pt appears laughs often, particularly when she appears to not know the answer to a question. She became irritable when she seemed to realize this was a mental health assessment and at one point said she would no longer participate but then quickly changed her mind. Thought process is coherent. There is no indication Pt is currently responding to internal stimuli or experiencing delusional thought content.   Diagnosis: Major neurocognitive disorder due to Alzheimer's disease, Probable, Without behavioral disturbance  Past Medical History:  Past Medical History:  Diagnosis Date  . Diabetes mellitus without complication ([place])   . Hypertension   . Renal disorder   . Seizures ([place])   . Stroke [facility])     History reviewed. No pertinent surgical history.  Family History: History reviewed. No pertinent family history.  Social History:  reports that she has never smoked. She has never used smokeless tobacco. She reports that she does not drink alcohol or use drugs.  Additional Social History:  Alcohol / Drug Use Pain Medications: See MAR Prescriptions: See MAR Over the Counter: See MAR History of alcohol / drug use?: Yes(Pt's family indicates Pt is drinking alcohol) Longest period of sobriety (when/how long): NA  CIWA: CIWA-Ar BP: (!) 110/58 Pulse Rate: 65 COWS:    Allergies:  Allergies  Allergen Reactions  . Shellfish Allergy Anaphylaxis    Throat swells  . Sulfa Antibiotics     Throat Swelling.    Home Medications:  (Not  in a hospital admission)  OB/GYN Status:  No LMP recorded. Patient is postmenopausal.  General Assessment Data [facility] of Assessment: [facility] TTS Assessment: In system Is this a Tele or Face-to-Face Assessment?: Tele Assessment Is this an Initial Assessment or a Re-assessment for this  encounter?: Initial Assessment Marital status: Divorced [place] name: NA Is patient pregnant?: No Pregnancy Status: No Living Arrangements: Other (Comment)("I live with my boyfriend") Can pt return to current living arrangement?: Yes Admission Status: Involuntary Is patient capable of signing voluntary admission?: No Referral Source: Self/Family/Friend Insurance type: Medicaid     Crisis Care Plan Living Arrangements: Other (Comment)("I live with my boyfriend") Legal Guardian: Other:(Self) Name of Psychiatrist: None Name of Therapist: None  Education Status Is patient currently in school?: No Is the patient employed, unemployed or receiving disability?: Receiving disability income  Risk to self with the past 6 months Suicidal Ideation: No Has patient been a risk to self within the past 6 months prior to admission? : No Suicidal Intent: No Has patient had any suicidal intent within the past 6 months prior to admission? : No Is patient at risk for suicide?: No Suicidal Plan?: No Has patient had any suicidal plan within the past 6 months prior to admission? : No Access to Means: No What has been your use of drugs/alcohol within the last 12 months?: Pt's daughter reports Pt drinks alcohol Previous Attempts/Gestures: No How many times?: 0 Other Self Harm Risks: Pt has Alzheimer's dementia Triggers for Past Attempts: None known Intentional Self Injurious Behavior: None Family Suicide History: Unknown Recent stressful life event(s): Other (Comment)(None identified) Persecutory voices/beliefs?: No Depression: Yes Depression Symptoms: Feeling angry/irritable, Isolating Substance abuse history and/or treatment for substance abuse?: No Suicide prevention information given to non-admitted patients: Not applicable  Risk to Others within the past 6 months Homicidal Ideation: No Does patient have any lifetime risk of violence toward others beyond the six months prior to admission? :  No Thoughts of Harm to Others: No Current Homicidal Intent: No Current Homicidal Plan: No Access to Homicidal Means: No Identified Victim: None History of harm to others?: No Assessment of Violence: None Noted Violent Behavior Description: None Does patient have access to weapons?: No Criminal Charges Pending?: No Does patient have a court date: No Is patient on probation?: No  Psychosis Hallucinations: None noted Delusions: None noted  Mental Status Report Appearance/Hygiene: In scrubs Eye Contact: Good Motor Activity: Unremarkable Speech: Logical/coherent Level of Consciousness: Alert Mood: Euthymic, Irritable Affect: Labile Anxiety Level: Minimal Thought Processes: Coherent Judgement: Impaired Orientation: Person, Place, Situation Obsessive Compulsive Thoughts/Behaviors: None  Cognitive Functioning Concentration: Normal Memory: Recent Impaired, Remote Impaired Is patient IDD: No Is patient DD?: No Insight: Poor Impulse Control: Fair Appetite: Good Have you had any weight changes? : No Change Sleep: No Change Total Hours of Sleep: 8 Vegetative Symptoms: Decreased grooming  ADLScreening [facility]) Patient's cognitive ability adequate to safely complete daily activities?: Yes Patient able to express need for assistance with ADLs?: Yes Independently performs ADLs?: Yes (appropriate for developmental age)  Prior Inpatient Therapy Prior Inpatient Therapy: No  Prior Outpatient Therapy Prior Outpatient Therapy: No Does patient have an ACCT team?: No Does patient have Intensive In-House Services?  : No Does patient have [facility] services? : No Does patient have P[facility] services?: No  ADL Screening (condition at time of admission) Patient's cognitive ability adequate to safely complete daily activities?: Yes Is the patient deaf or have difficulty hearing?: No Does the patient have difficulty seeing,  even when wearing glasses/contacts?: No Does the  patient have difficulty concentrating, remembering, or making decisions?: Yes Patient able to express need for assistance with ADLs?: Yes Does the patient have difficulty dressing or bathing?: No Independently performs ADLs?: Yes (appropriate for developmental age) Does the patient have difficulty walking or climbing stairs?: Yes Weakness of Legs: Right Weakness of Arms/Hands: None  Home Assistive Devices/Equipment Home Assistive Devices/Equipment: Cane (specify quad or straight)    Abuse/Neglect Assessment (Assessment to be complete while patient is alone) Abuse/Neglect Assessment Can Be Completed: Unable to assess, patient is non-responsive or altered mental status     Advance Directives (For Healthcare) Does Patient Have a Medical Advance Directive?: No Would patient like information on creating a medical advance directive?: No - Patient declined          Disposition: Gave clinical report to Patriciaann Clan, [place] who recommended Pt have social work consult. Notified Dr. Shanon Rosser and Rosezella Rumpf, RN of recommendation.  Disposition Initial Assessment Completed for this Encounter: Yes  This service was provided via telemedicine using a 2-way, interactive audio and video technology.  Names of all persons participating in this telemedicine service and their role in this encounter. Name: Lacey Cook Role: Patient  Name: Storm Frisk, [place] Role: TTS counselor         Orpah Greek Anson Fret, [facility], [facility], [facility] Triage Specialist [phone]  Anson Fret, Orpah Greek 12/07/2017 1:22 AM

## 2017-12-07 NOTE — Discharge Instructions (Signed)
Patient does not meet psychiatric inpatient hospitalization criteria at this time and has been recommended for discharge.   Outpatient Resources   [facility]  [place], [place], [place]  [id number] [phone]  [place]:  [place], [place] [address] [phone]  [facility]  [address] [place], [place] [address] [phone]  [facility] [address] [place], [place]  [address] [phone]  [facility]  [address]  [place], [place] [address] [phone] (offers ACTT Team)

## 2017-12-07 NOTE — ED Notes (Signed)
Pt discharged to home. DC instructions given to daughter. Pt left unit in wheelchair pushed by nurse tech accompanied by daughter with security escort. Left in stable condition.  Hale Bogus.

## 2017-12-07 NOTE — BHH Suicide Risk Assessment (Signed)
Suicide Risk Assessment  Discharge Assessment   [facility] Discharge Suicide Risk Assessment   Principal Problem: Adjustment disorder with disturbance of emotion Discharge Diagnoses:  Patient Active Problem List   Diagnosis Date Noted  . Adjustment disorder with disturbance of emotion [F43.29] 12/07/2017    Priority: High    Total Time spent with patient: 45 minutes  Musculoskeletal: Strength & Muscle Tone: within normal limits Gait & Station: normal Patient leans: N/A  Psychiatric Specialty Exam:   Blood pressure (!) 107/55, pulse 61, temperature 98.4 F (36.9 C), temperature source Oral, resp. rate 18, height 5\' 2"  (1.575 m), weight 81.6 kg (180 lb), SpO2 95 %.Body mass index is 32.92 kg/m.  General Appearance: Casual  Eye Contact::  Good  Speech:  Normal Rate409  Volume:  Normal  Mood:  Euthymic  Affect:  Congruent  Thought Process:  Coherent and Descriptions of Associations: Intact  Orientation:  Full (Time, Place, and Person)  Thought Content:  WDL and Logical  Suicidal Thoughts:  No  Homicidal Thoughts:  No  Memory:  Immediate;   Good Recent;   Good Remote;   Good  Judgement:  Fair  Insight:  Fair  Psychomotor Activity:  Normal  Concentration:  Good  Recall:  Good  Fund of Knowledge:Good  Language: Good  Akathisia:  No  Handed:  Right  AIMS (if indicated):     Assets:  Leisure Time Physical Health Resilience Social Support  Sleep:     Cognition: WNL  ADL's:  Intact   Mental Status Per Nursing Assessment::   On Admission:   62 yo female IVC'd by her daughter for memory issues.  She allegedly has a diagnosis of dementia according to the daughter but not on any medicines or any diagnosis to support this.  Her daughter also claimed her mother was drinking but negative on UDS.   Patient denies suicidal/homicidal ideations, hallucinations, and substance abuse.  Reports she lives with her boyfriend and has for a few years but her daughter does not like him because he  is black.  Stable for discharge.  Demographic Factors:  Caucasian  Loss Factors: NA  Historical Factors: NA  Risk Reduction Factors:   Sense of responsibility to family, Living with another person, especially a relative, Positive social support and Positive therapeutic relationship  Continued Clinical Symptoms:  None  Cognitive Features That Contribute To Risk:  None    Suicide Risk:  Minimal: No identifiable suicidal ideation.  Patients presenting with no risk factors but with morbid ruminations; may be classified as minimal risk based on the severity of the depressive symptoms    Plan Of Care/Follow-up recommendations:  Activity:  as tolerated Diet:  heart healthy diet  Lacey Stambaugh, NP 12/07/2017, 11:31 AM

## 2017-12-08 LAB — URINE CULTURE

## 2018-07-20 ENCOUNTER — Emergency Department (HOSPITAL_COMMUNITY): Payer: Medicaid Other

## 2018-07-20 ENCOUNTER — Emergency Department (HOSPITAL_COMMUNITY)
Admission: EM | Admit: 2018-07-20 | Discharge: 2018-07-20 | Disposition: A | Discharge status: Home / Self Care | Payer: Medicaid Other | Attending: Emergency Medicine | Admitting: Emergency Medicine

## 2018-07-20 ENCOUNTER — Other Ambulatory Visit: Payer: Self-pay

## 2018-07-20 ENCOUNTER — Encounter (HOSPITAL_COMMUNITY): Payer: Self-pay | Admitting: Emergency Medicine

## 2018-07-20 DIAGNOSIS — Z7984 Long term (current) use of oral hypoglycemic drugs: Secondary | ICD-10-CM | POA: Diagnosis not present

## 2018-07-20 DIAGNOSIS — E119 Type 2 diabetes mellitus without complications: Secondary | ICD-10-CM | POA: Insufficient documentation

## 2018-07-20 DIAGNOSIS — M25551 Pain in right hip: Secondary | ICD-10-CM | POA: Diagnosis present

## 2018-07-20 DIAGNOSIS — Z79899 Other long term (current) drug therapy: Secondary | ICD-10-CM | POA: Insufficient documentation

## 2018-07-20 DIAGNOSIS — I10 Essential (primary) hypertension: Secondary | ICD-10-CM | POA: Diagnosis not present

## 2018-07-20 DIAGNOSIS — R0602 Shortness of breath: Secondary | ICD-10-CM | POA: Diagnosis not present

## 2018-07-20 DIAGNOSIS — F0390 Unspecified dementia without behavioral disturbance: Secondary | ICD-10-CM | POA: Diagnosis not present

## 2018-07-20 DIAGNOSIS — R339 Retention of urine, unspecified: Secondary | ICD-10-CM | POA: Insufficient documentation

## 2018-07-20 DIAGNOSIS — Z8673 Personal history of transient ischemic attack (TIA), and cerebral infarction without residual deficits: Secondary | ICD-10-CM | POA: Insufficient documentation

## 2018-07-20 DIAGNOSIS — R0789 Other chest pain: Secondary | ICD-10-CM | POA: Diagnosis not present

## 2018-07-20 HISTORY — DX: Pain in right hip: M25.551

## 2018-07-20 HISTORY — DX: Other chronic pain: G89.29

## 2018-07-20 LAB — CBC WITH DIFFERENTIAL/PLATELET
ABS IMMATURE GRANULOCYTES: 0.01 10*3/uL (ref 0.00–0.07)
BASOS ABS: 0 10*3/uL (ref 0.0–0.1)
Basophils Relative: 1 %
Eosinophils Absolute: 0.3 10*3/uL (ref 0.0–0.5)
Eosinophils Relative: 7 %
HCT: 42.2 % (ref 36.0–46.0)
HEMOGLOBIN: 14.1 g/dL (ref 12.0–15.0)
Immature Granulocytes: 0 %
LYMPHS PCT: 35 %
Lymphs Abs: 1.5 10*3/uL (ref 0.7–4.0)
MCH: 33.1 pg (ref 26.0–34.0)
MCHC: 33.4 g/dL (ref 30.0–36.0)
MCV: 99.1 fL (ref 80.0–100.0)
MONO ABS: 0.3 10*3/uL (ref 0.1–1.0)
Monocytes Relative: 7 %
NEUTROS ABS: 2.1 10*3/uL (ref 1.7–7.7)
NRBC: 0 % (ref 0.0–0.2)
Neutrophils Relative %: 50 %
Platelets: 188 10*3/uL (ref 150–400)
RBC: 4.26 MIL/uL (ref 3.87–5.11)
RDW: 12.7 % (ref 11.5–15.5)
WBC: 4.2 10*3/uL (ref 4.0–10.5)

## 2018-07-20 LAB — COMPREHENSIVE METABOLIC PANEL
ALBUMIN: 3.9 g/dL (ref 3.5–5.0)
ALK PHOS: 57 U/L (ref 38–126)
ALT: 14 U/L (ref 0–44)
AST: 18 U/L (ref 15–41)
Anion gap: 6 (ref 5–15)
BUN: 18 mg/dL (ref 8–23)
CALCIUM: 8.8 mg/dL — AB (ref 8.9–10.3)
CO2: 27 mmol/L (ref 22–32)
CREATININE: 0.71 mg/dL (ref 0.44–1.00)
Chloride: 108 mmol/L (ref 98–111)
GFR calc Af Amer: >60 mL/min (ref >60)
GFR calc non Af Amer: >60 mL/min (ref >60)
GLUCOSE: 95 mg/dL (ref 70–99)
Potassium: 3.3 mmol/L — ABNORMAL LOW (ref 3.5–5.1)
SODIUM: 141 mmol/L (ref 135–145)
Total Bilirubin: 0.6 mg/dL (ref 0.3–1.2)
Total Protein: 6.8 g/dL (ref 6.5–8.1)

## 2018-07-20 LAB — URINALYSIS, ROUTINE W REFLEX MICROSCOPIC
Bilirubin Urine: NEGATIVE
Glucose, UA: NEGATIVE mg/dL
HGB URINE DIPSTICK: NEGATIVE
Ketones, ur: 5 mg/dL — AB
Leukocytes,Ua: NEGATIVE
Nitrite: NEGATIVE
PH: 5 (ref 5.0–8.0)
Protein, ur: NEGATIVE mg/dL
SPECIFIC GRAVITY, URINE: 1.032 — AB (ref 1.005–1.030)

## 2018-07-20 LAB — I-STAT TROPONIN, ED: Troponin i, poc: 0 ng/mL (ref 0.00–0.08)

## 2018-07-20 MED ORDER — ACETAMINOPHEN 325 MG PO TABS: 650.0000 mg | ORAL_TABLET | Freq: Once | ORAL | Status: DC

## 2018-07-20 NOTE — ED Triage Notes (Signed)
Patient arrived by EMS from home. Pt lives with daughter. Pt c/o hip pain that has been getting worst. Pt has been having urinary retention for the past few days. Pt denies abdominal pain per EMS.   BP 142/60, HR 65, SpO2 98%, CBG 86, RR 18.   Hx of Dementia, DM, and breast cancer.

## 2018-07-20 NOTE — ED Provider Notes (Addendum)
Care assumed at shift changed from [facility]. See her note for full HPI and work-up.  Briefly, patient brought via EMS per patient's daughter with complaint of difficulty urinating for the past few days.  Patient does have dementia at baseline, and is currently at baseline mental status.  CBC and CMP are both unremarkable.  Pending UA.  X-ray of right hip showing degenerative changes, ordered due to pain on exam with fall 2 weeks ago.  Plan to follow-up UA results.  Case management consulted to establish PCP.  Anticipated discharge Physical Exam  BP 107/67 (BP Location: Left Arm)   Pulse (!) 57   Temp 97.9 F (36.6 C) (Oral)   Resp 17   SpO2 98%   Physical Exam Vitals signs and nursing note reviewed.  Constitutional:      Appearance: She is well-developed.     Comments: Alert, conversing. No distress noted.   HENT:     Head: Normocephalic and atraumatic.  Eyes:     Conjunctiva/sclera: Conjunctivae normal.  Cardiovascular:     Rate and Rhythm: Normal rate.  Pulmonary:     Effort: Pulmonary effort is normal.  Abdominal:     Palpations: Abdomen is soft.  Skin:    General: Skin is warm.  Neurological:     Mental Status: She is alert. Mental status is at baseline.  Psychiatric:        Behavior: Behavior normal.    Results for orders placed or performed during the hospital encounter of 07/20/18  Urinalysis, Routine w reflex microscopic  Result Value Ref Range   Color, Urine AMBER (A) YELLOW   APPearance HAZY (A) CLEAR   Specific Gravity, Urine 1.032 (H) 1.005 - 1.030   pH 5.0 5.0 - 8.0   Glucose, UA NEGATIVE NEGATIVE mg/dL   Hgb urine dipstick NEGATIVE NEGATIVE   Bilirubin Urine NEGATIVE NEGATIVE   Ketones, ur 5 (A) NEGATIVE mg/dL   Protein, ur NEGATIVE NEGATIVE mg/dL   Nitrite NEGATIVE NEGATIVE   Leukocytes,Ua NEGATIVE NEGATIVE  CBC with Differential  Result Value Ref Range   WBC 4.2 4.0 - 10.5 K/uL   RBC 4.26 3.87 - 5.11 MIL/uL   Hemoglobin 14.1 12.0 - 15.0 g/dL   HCT  42.2 36.0 - 46.0 %   MCV 99.1 80.0 - 100.0 fL   MCH 33.1 26.0 - 34.0 pg   MCHC 33.4 30.0 - 36.0 g/dL   RDW 12.7 11.5 - 15.5 %   Platelets 188 150 - 400 K/uL   nRBC 0.0 0.0 - 0.2 %   Neutrophils Relative % 50 %   Neutro Abs 2.1 1.7 - 7.7 K/uL   Lymphocytes Relative 35 %   Lymphs Abs 1.5 0.7 - 4.0 K/uL   Monocytes Relative 7 %   Monocytes Absolute 0.3 0.1 - 1.0 K/uL   Eosinophils Relative 7 %   Eosinophils Absolute 0.3 0.0 - 0.5 K/uL   Basophils Relative 1 %   Basophils Absolute 0.0 0.0 - 0.1 K/uL   Immature Granulocytes 0 %   Abs Immature Granulocytes 0.01 0.00 - 0.07 K/uL  Comprehensive metabolic panel  Result Value Ref Range   Sodium 141 135 - 145 mmol/L   Potassium 3.3 (L) 3.5 - 5.1 mmol/L   Chloride 108 98 - 111 mmol/L   CO2 27 22 - 32 mmol/L   Glucose, Bld 95 70 - 99 mg/dL   BUN 18 8 - 23 mg/dL   Creatinine, Ser 0.71 0.44 - 1.00 mg/dL   Calcium 8.8 (L)  8.9 - 10.3 mg/dL   Total Protein 6.8 6.5 - 8.1 g/dL   Albumin 3.9 3.5 - 5.0 g/dL   AST 18 15 - 41 U/L   ALT 14 0 - 44 U/L   Alkaline Phosphatase 57 38 - 126 U/L   Total Bilirubin 0.6 0.3 - 1.2 mg/dL   GFR calc non Af Amer >60 >60 mL/min   GFR calc Af Amer >60 >60 mL/min   Anion gap 6 5 - 15  I-Stat Troponin, ED (not at [facility]  Result Value Ref Range   Troponin i, poc 0.00 0.00 - 0.08 ng/mL   Comment 3           Dg Chest 2 View  Result Date: 07/20/2018 CLINICAL DATA:  Shortness of breath. EXAM: CHEST - 2 VIEW COMPARISON:  07/26/17 FINDINGS: The heart size and mediastinal contours are within normal limits. Left midlung scar is similar to previous exam. Both lungs are clear. The visualized skeletal structures are unremarkable. IMPRESSION: No active cardiopulmonary disease. Electronically Signed   By: Kerby Moors M.D.   On: 07/20/2018 16:59   Dg Hip Unilat W Or Wo Pelvis 2-3 Views Right  Result Date: 07/20/2018 CLINICAL DATA:  Hip pain after fall. EXAM: DG HIP (WITH OR WITHOUT PELVIS) 2-3V RIGHT COMPARISON:  July 26, 2017 FINDINGS: Severe degenerative changes in the right hip with near complete loss of joint space, osteophytes, and bony sclerosis. There is no change in the right hip configuration to suggest fracture. No dislocation. No other acute abnormalities. 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 No change to suggest fracture. Electronically Signed   By: Dorise Bullion III M.D   On: 07/20/2018 15:20    EKG Interpretation  Date/Time:  Monday July 20 2018 16:52:45 EST Ventricular Rate:  56 PR Interval:    QRS Duration: 95 QT Interval:  471 QTC Calculation: 455 R Axis:   -31 Text Interpretation:  Sinus rhythm Left axis deviation Abnormal R-wave progression, early transition since last tracing no significant change Confirmed by Daleen Bo [phone]) on 07/20/2018 6:46:55 PM       ED Course/Procedures   Clinical Course as of Jul 20 1941  Mon Jul 20, 2018  1618 Pt evaluated by Dr. Billy Fischer. Pt reporting some SOB when sitting up for exam and some pain to left axilla that is reproducible on exam. Plan to obtain cxr, ekg and 1 troponin. If neg, anticipated discharge.   [JR]    Clinical Course User Index [JR] Robinson, [place] N, PA-C    Procedures  MDM  U/A neg for infection chest X-ray, EKG, and troponin reassuring. Case management consulted and will call patient's daughter in the morning with PCP appointment. Daughter is aware and agreeable. Pt discharged.       Robinson, [place] N, PA-C 07/20/18 1944    Robinson, [place] N, PA-C 07/20/18 1944    Robinson, [place] N, PA-C 07/20/18 1945    Daleen Bo, MD 07/20/18 [phone]

## 2018-07-20 NOTE — Discharge Planning (Signed)
EDCM consulted to assist with PCP establishment appointment.  EDCM called [facility] but it was closed.  Notified EDP and pt daughter that [facility] will contact office tomorrow and call her with appointment date and time.

## 2018-07-20 NOTE — Progress Notes (Signed)
Consult request has been received. CSW attempting to follow up at present time.  CSW spoke to EDP who stated pt only has RN CM needs at this time.  Please reconsult if future social work needs arise.  CSW signing off, as social work intervention is no longer needed.  Alphonse Guild. Dalante Minus, LCSW, LCAS, CSI Clinical Social Worker Ph: [phone]

## 2018-07-20 NOTE — Discharge Instructions (Addendum)
Your labs and urine look normal today. The case manager will call you tomorrow with a primary care appointment. You can take tylenol every 6 hours as needed for pain.

## 2018-07-20 NOTE — ED Provider Notes (Signed)
[place] DEPT Provider Note   CSN: [id number] Arrival date & time: 07/20/18  1259    History   Chief Complaint Chief Complaint  Patient presents with  . Hip Pain  . Urinary Retention    HPI Lacey Cook is a 63 y.o. female who presents with hip pain and difficulty urinating. PMH significant for severe dementia, diabetes, HTN, hx of seizures and hx of CVA. The patient cannot provide any history and she doesn't have anyone at bedside. She states that she is from [place]. She doesn't know where she is or why she's here and repeatedly states she can't remember. She has had a hip replacement in the past but can't remember which side. She states she can walk with a cane at baseline. Apparently her daughter, Suanne Marker, called EMS for worsening hip pain and some difficulty urinating. The last time she was in the ED was in July when she was IVC'd by her daughter for not taking her medicines and abusing alcohol. I was able to call Suanne Marker and she states she called EMS because the patient was having difficulty urinating today and yesterday. Suanne Marker states that they moved here about 2 years ago from [place]. She doesn't have a doctor because the PCP they were referred to wasn't accepting new patients. She isn't taking any of her medicines. She's been complaining of her hip hurting too. She had a minor fall after trying to get out of bed 2 weeks ago but has been ambulatory since then.  LEVEL 5 caveat due to dementia.   HPI  Past Medical History:  Diagnosis Date  . Chronic right hip pain   . Diabetes mellitus without complication ([place])   . Hypertension   . Renal disorder   . Seizures ([place])   . Stroke [facility])     Patient Active Problem List   Diagnosis Date Noted  . Adjustment disorder with disturbance of emotion 12/07/2017    History reviewed. No pertinent surgical history.   OB History   No obstetric history on file.      Home Medications    Prior to  Admission medications   Medication Sig Start Date End Date Taking? Authorizing Provider  albuterol (PROVENTIL HFA;VENTOLIN HFA) 108 (90 Base) MCG/ACT inhaler Inhale 1-2 puffs into the lungs every 6 (six) hours as needed for wheezing or shortness of breath.    [provider]  clonazePAM (KLONOPIN) 0.5 MG tablet Take 0.5 mg by mouth 2 (two) times daily as needed for anxiety.    [provider]  fluticasone (FLONASE) 50 MCG/ACT nasal spray Place 1 spray into both nostrils daily. 07/26/17   Langston Masker B, PA-C  metFORMIN (GLUCOPHAGE) 500 MG tablet Take 500 mg by mouth 2 (two) times daily with a meal.    [provider]  Oxcarbazepine (TRILEPTAL) 300 MG tablet Take 300 mg by mouth 2 (two) times daily.    [provider]  sertraline (ZOLOFT) 50 MG tablet Take 50 mg by mouth daily.    [provider]  traZODone (DESYREL) 150 MG tablet Take 150 mg by mouth at bedtime.    [provider]  zolpidem (AMBIEN) 5 MG tablet Take 5 mg by mouth at bedtime as needed for sleep.    [provider]    Family History History reviewed. No pertinent family history.  Social History Social History   Tobacco Use  . Smoking status: Never Smoker  . Smokeless tobacco: Never Used  Substance Use Topics  .  Alcohol use: No    Frequency: Never  . Drug use: Never     Allergies   Shellfish allergy and Sulfa antibiotics   Review of Systems Review of Systems  Unable to perform ROS: Dementia     Physical Exam Updated Vital Signs BP 107/67 (BP Location: Left Arm)   Pulse (!) 57   Temp 97.9 F (36.6 C) (Oral)   Resp 17   SpO2 98%   Physical Exam Vitals signs and nursing note reviewed.  Constitutional:      General: She is awake. She is not in acute distress.    Appearance: She is well-developed. She is obese. She is not ill-appearing or toxic-appearing.  HENT:     Head: Normocephalic and atraumatic.  Eyes:     General: No scleral icterus.        Right eye: No discharge.        Left eye: No discharge.     Conjunctiva/sclera: Conjunctivae normal.     Pupils: Pupils are equal, round, and reactive to light.  Neck:     Musculoskeletal: Normal range of motion.  Cardiovascular:     Rate and Rhythm: Normal rate and regular rhythm.  Pulmonary:     Effort: Pulmonary effort is normal. No respiratory distress.     Breath sounds: Normal breath sounds.  Abdominal:     General: There is no distension.     Palpations: Abdomen is soft.     Tenderness: There is no abdominal tenderness.  Musculoskeletal:     Comments: Tenderness over the R hip. Unable to lift hip off the stretcher. 2+ DP pulse  No tenderness of L hip and able bend hip without difficulty  Skin:    General: Skin is warm and dry.  Neurological:     Mental Status: She is alert. She is disoriented.  Psychiatric:        Attention and Perception: Attention normal.        Mood and Affect: Mood is elated.        Speech: Speech normal.        Behavior: Behavior normal. Behavior is cooperative.        Thought Content: Thought content normal.        Cognition and Memory: Cognition is impaired. Memory is impaired. She exhibits impaired recent memory and impaired remote memory.      ED Treatments / Results  Labs (all labs ordered are listed, but only abnormal results are displayed) Labs Reviewed  COMPREHENSIVE METABOLIC PANEL - Abnormal; Notable for the following components:      Result Value   Potassium 3.3 (*)    Calcium 8.8 (*)    All other components within normal limits  CBC WITH DIFFERENTIAL/PLATELET  URINALYSIS, ROUTINE W REFLEX MICROSCOPIC    EKG None  Radiology Dg Hip Unilat W Or Wo Pelvis 2-3 Views Right  Result Date: 07/20/2018 CLINICAL DATA:  Hip pain after fall. EXAM: DG HIP (WITH OR WITHOUT PELVIS) 2-3V RIGHT COMPARISON:  July 26, 2017 FINDINGS: Severe degenerative changes in the right hip with near complete loss of joint space, osteophytes, and bony  sclerosis. There is no change in the right hip configuration to suggest fracture. No dislocation. No other acute abnormalities. 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 No change to suggest fracture. Electronically Signed   By: Dorise Bullion III M.D   On: 07/20/2018 15:20    Procedures Procedures (including critical care time)  Medications Ordered in ED Medications  acetaminophen (  TYLENOL) tablet 650 mg (has no administration in time range)     Initial Impression / Assessment and Plan / ED Course  I have reviewed the triage vital signs and the nursing notes.  Pertinent labs & imaging results that were available during my care of the patient were reviewed by me and considered in my medical decision making (see chart for details).  63 year old female presents hip pain and difficulty urinating. The patient is severely demented and cannot provide a history. I attempted to contact her daughter who is not answering the phone. Her vitals are normal. She is alert and cooperative on exam but doesn't know why she is here. She doesn't have any complaints. Will obtain blood work, UA, Xray of the R hip.   I finally got in touch with her daughter. She states she called EMS today because the patient was having difficulty urinating and since the patient is diabetic and not taking any meds she is worried about her kidney function. Her kidney function is normal here. UA is still pending. CM/SW consulted for help with getting a PCP. Care signed out to Donnelly Angelica PA-C at shift change.  Final Clinical Impressions(s) / ED Diagnoses   Final diagnoses:  Right hip pain    ED Discharge Orders    None       Recardo Evangelist, PA-C 07/20/18 1608    Gareth Morgan, MD 07/21/18 0730

## 2018-07-20 NOTE — ED Notes (Signed)
RN unable to contact pt daughter. Pt unable to remember daughters phone number. Pt's Home number on chart is not working. Will discharge patient with PTAR.

## 2018-07-21 ENCOUNTER — Telehealth: Payer: Self-pay | Admitting: *Deleted

## 2018-07-21 NOTE — Telephone Encounter (Signed)
Rowdy Guerrini J. Clydene Laming, RN, BSN, [place] [phone]  [facility] set up appointment with [facility] on 3/2 @ [phone].  Spoke with pt daughter via telephone and advised to please arrive 15 min early and take a picture ID and your current medications.  Pt daughter verbalizes understanding of keeping appointment.

## 2018-07-27 ENCOUNTER — Ambulatory Visit: Payer: Medicaid Other | Admitting: Family Medicine

## 2018-11-24 ENCOUNTER — Other Ambulatory Visit: Payer: Self-pay

## 2018-11-24 ENCOUNTER — Ambulatory Visit (INDEPENDENT_AMBULATORY_CARE_PROVIDER_SITE_OTHER): Payer: Medicaid Other | Admitting: Student in an Organized Health Care Education/Training Program

## 2018-11-24 VITALS — BP 114/60 | HR 92 | Temp 98.2°F | Wt 194.6 lb

## 2018-11-24 DIAGNOSIS — Z0000 Encounter for general adult medical examination without abnormal findings: Secondary | ICD-10-CM | POA: Diagnosis not present

## 2018-11-24 DIAGNOSIS — J45909 Unspecified asthma, uncomplicated: Secondary | ICD-10-CM | POA: Diagnosis not present

## 2018-11-24 DIAGNOSIS — M1611 Unilateral primary osteoarthritis, right hip: Secondary | ICD-10-CM | POA: Diagnosis not present

## 2018-11-24 DIAGNOSIS — M1990 Unspecified osteoarthritis, unspecified site: Secondary | ICD-10-CM

## 2018-11-24 DIAGNOSIS — F339 Major depressive disorder, recurrent, unspecified: Secondary | ICD-10-CM

## 2018-11-24 DIAGNOSIS — L304 Erythema intertrigo: Secondary | ICD-10-CM | POA: Diagnosis not present

## 2018-11-24 LAB — POCT GLYCOSYLATED HEMOGLOBIN (HGB A1C): Hemoglobin A1C: 4.9 % (ref 4.0–5.6)

## 2018-11-24 MED ORDER — ALBUTEROL SULFATE HFA 108 (90 BASE) MCG/ACT IN AERS: 2.0000 | INHALATION_SPRAY | Freq: Four times a day (QID) | RESPIRATORY_TRACT | 1 refills | Status: AC | PRN

## 2018-11-24 MED ORDER — NYSTATIN 100000 UNIT/GM EX CREA: 1.0000 "application " | TOPICAL_CREAM | Freq: Two times a day (BID) | CUTANEOUS | 0 refills | Status: DC

## 2018-11-24 MED ORDER — VENLAFAXINE HCL 25 MG PO TABS: 25.0000 mg | ORAL_TABLET | Freq: Two times a day (BID) | ORAL | 0 refills | Status: DC

## 2018-11-24 NOTE — Progress Notes (Signed)
Subjective:    Patient ID: Lacey Cook, female    DOB: [birth year], 63 y.o.   MRN: [id number]   CC: new patient, establish care  HPI: Most of history is provided by daughter. Requested records from previous PCP Many problems were brought up at this visit so we decided to follow up closely with another appointment to be able to devote more time to them.   Patient has difficulty with ambulation 2/2 arthritis of knees. Only bathroom in house is upstairs and patient has to crawl up the stairs to reach them which often leads to her having episodes of incontinence by not being able to reach the bathroom in time. Denies dysuria. Pain is controlled with OTC pain medicine and patient uses a cane. They have looked into option to move to one level home. Patient recently moved in with daughter and shares a room with her granddaughter currently.  Daughter endorses long standing depression/anxiety for mother which has not been treated for a long time since she has not had PCP follow up in several years. She used to be on a medication but they cannot remember the name. She also went to counseling but patient declines this as an option at this time. They would like to have a medication. Patient endorses being sad every day. She cries most days. She feels happiest when she's with her family.   Daughter was assisting with bathing a couple of days ago and noticed there was a red rash under abdominal pannus. Patient denies pain but has some pruritus in the area. Not sure how long rash has been there. Want a treatment for it. It is not located elsewhere. No one else in household has similar.   Patient has a remote history of asthma per daughter and has been using someone elses inhaler as she has not been to PCP in years. Denies any respiratory distress currently. No hospitalizations for exacerbations. Requires inhaler <2x/month.  Smoking status reviewed   ROS: pertinent noted in the HPI   Past Medical History:   Diagnosis Date  . Chronic right hip pain   . Diabetes mellitus without complication ([place])   . Hypertension   . Renal disorder   . Seizures ([place])   . Stroke [facility])     No past surgical history on file.  Past medical history, surgical, family, and social history reviewed and updated in the EMR as appropriate.  Objective:  BP 114/60   Pulse 92   Temp 98.2 F (36.8 C) (Axillary)   Wt 194 lb 9.6 oz (88.3 kg)   SpO2 95%   BMI 35.59 kg/m   Vitals and nursing note reviewed  General: NAD, pleasant, able to participate in exam Cardiac: RRR, S1 S2 present. normal heart sounds, no murmurs. Respiratory: CTAB, normal effort, No wheezes, rales or rhonchi Extremities: trace edema bilateral, no cyanosis. Skin: warm and dry diffusely. Under abdominal pannus is erythematous plaque, dry with no drainage. Satellite lesions present. No increased warms. Does not appear to be cellulitis  Neuro: alert, no obvious focal deficits Psych: pleasant and confused.    Assessment & Plan:    Arthritis Provide with DME form for bedside commode so patient does not have to crawl up stairs to use bathroom OTC tylenol prn  Intertriginous dermatitis associated with moisture Recommended good hygiene and drying well after bathing Prescribed nystatin cream  Return if not improved  Basic labs revealed normal CBC, CMP, TSH, Hgb A1c 4.9, and lipid panel  Doristine Mango, DO [facility] PGY-1

## 2018-11-24 NOTE — Patient Instructions (Signed)
It was a pleasure to see you today!  To summarize our discussion for this visit:  This was our first visit to get to know each other  I am prescribing a bedside commode which you can take to any durable medical equipment store.  I am prescribing venlafaxine for depression/anxiety  I am prescribing an antifungal cream for the skin fold with rash  I am prescribing an inhaler for asthma exacerbations  We are checking several basic blood labs today  Please follow up for more discussions on incontinence   Please return to our clinic to see me in 3 weeks for follow up.  Call the clinic at [phone] if your symptoms worsen or you have any concerns.  Thank you for allowing me to take part in your care,  Dr. Doristine Mango   Thanks for choosing [facility] for your primary care.

## 2018-11-25 LAB — CBC
Hematocrit: 37.9 % (ref 34.0–46.6)
Hemoglobin: 12.8 g/dL (ref 11.1–15.9)
MCH: 32.1 pg (ref 26.6–33.0)
MCHC: 33.8 g/dL (ref 31.5–35.7)
MCV: 95 fL (ref 79–97)
Platelets: 258 10*3/uL (ref 150–450)
RBC: 3.99 x10E6/uL (ref 3.77–5.28)
RDW: 12 % (ref 11.7–15.4)
WBC: 5.3 10*3/uL (ref 3.4–10.8)

## 2018-11-25 LAB — COMPREHENSIVE METABOLIC PANEL
ALT: 11 IU/L (ref 0–32)
AST: 14 IU/L (ref 0–40)
Albumin/Globulin Ratio: 1.5 (ref 1.2–2.2)
Albumin: 4 g/dL (ref 3.8–4.8)
Alkaline Phosphatase: 76 IU/L (ref 39–117)
BUN/Creatinine Ratio: 21 (ref 12–28)
BUN: 16 mg/dL (ref 8–27)
Bilirubin Total: 0.4 mg/dL (ref 0.0–1.2)
CO2: 26 mmol/L (ref 20–29)
Calcium: 9 mg/dL (ref 8.7–10.3)
Chloride: 102 mmol/L (ref 96–106)
Creatinine, Ser: 0.76 mg/dL (ref 0.57–1.00)
GFR calc Af Amer: 98 mL/min/{1.73_m2} (ref >59)
GFR calc non Af Amer: 85 mL/min/{1.73_m2} (ref >59)
Globulin, Total: 2.6 g/dL (ref 1.5–4.5)
Glucose: 74 mg/dL (ref 65–99)
Potassium: 3.9 mmol/L (ref 3.5–5.2)
Sodium: 143 mmol/L (ref 134–144)
Total Protein: 6.6 g/dL (ref 6.0–8.5)

## 2018-11-25 LAB — LIPID PANEL
Chol/HDL Ratio: 3.2 ratio (ref 0.0–4.4)
Cholesterol, Total: 174 mg/dL (ref 100–199)
HDL: 54 mg/dL (ref >39)
LDL Calculated: 99 mg/dL (ref 0–99)
Triglycerides: 106 mg/dL (ref 0–149)
VLDL Cholesterol Cal: 21 mg/dL (ref 5–40)

## 2018-11-25 LAB — TSH: TSH: 1.7 u[IU]/mL (ref 0.450–4.500)

## 2018-11-30 DIAGNOSIS — F339 Major depressive disorder, recurrent, unspecified: Secondary | ICD-10-CM | POA: Insufficient documentation

## 2018-11-30 DIAGNOSIS — L304 Erythema intertrigo: Secondary | ICD-10-CM | POA: Insufficient documentation

## 2018-11-30 DIAGNOSIS — M1990 Unspecified osteoarthritis, unspecified site: Secondary | ICD-10-CM | POA: Insufficient documentation

## 2018-11-30 DIAGNOSIS — J45909 Unspecified asthma, uncomplicated: Secondary | ICD-10-CM | POA: Insufficient documentation

## 2018-11-30 NOTE — Assessment & Plan Note (Addendum)
Provide with DME form for bedside commode so patient does not have to crawl up stairs to use bathroom OTC tylenol prn Referral for PT

## 2018-11-30 NOTE — Assessment & Plan Note (Signed)
Unclear on history- requesting previous pcp records Prescribed rescue inhaler

## 2018-11-30 NOTE — Assessment & Plan Note (Signed)
Starting on venlafaxine 25mg  today. Follow up apt in ~2-3 weeks to assess tolerating

## 2018-11-30 NOTE — Assessment & Plan Note (Signed)
Recommended good hygiene and drying well after bathing Prescribed nystatin cream  Return if not improved

## 2018-12-14 ENCOUNTER — Encounter: Payer: Self-pay | Admitting: Student in an Organized Health Care Education/Training Program

## 2018-12-14 ENCOUNTER — Other Ambulatory Visit: Payer: Self-pay

## 2018-12-14 ENCOUNTER — Ambulatory Visit (INDEPENDENT_AMBULATORY_CARE_PROVIDER_SITE_OTHER): Payer: Medicaid Other | Admitting: Student in an Organized Health Care Education/Training Program

## 2018-12-14 VITALS — BP 105/62 | HR 71 | Temp 98.7°F

## 2018-12-14 DIAGNOSIS — Z79899 Other long term (current) drug therapy: Secondary | ICD-10-CM | POA: Diagnosis not present

## 2018-12-14 DIAGNOSIS — F339 Major depressive disorder, recurrent, unspecified: Secondary | ICD-10-CM

## 2018-12-14 MED ORDER — VENLAFAXINE HCL 75 MG PO TABS: 75.0000 mg | ORAL_TABLET | Freq: Two times a day (BID) | ORAL | 1 refills | Status: AC

## 2018-12-14 NOTE — Patient Instructions (Signed)
It was a pleasure to see you today!  To summarize our discussion for this visit:  I'm glad to hear that you are doing well.   I can increase your venlafaxine dose to 75mg  two times per day.  I hope physical therapy does well for you.  Some additional health maintenance measures we should update are: Health Maintenance Due  Topic Date Due  . Hepatitis C Screening  26-Feb-1956  . HIV Screening  02/10/1972  . TETANUS/TDAP  02/10/1976  . PAP SMEAR-Modifier  02/09/1978  . MAMMOGRAM  02/10/2007  . COLONOSCOPY  02/10/2007  .   Please return to our clinic to see me about 3 months.  Call the clinic at [phone] if your symptoms worsen or you have any concerns.   Thank you for allowing me to take part in your care,  Dr. Doristine Mango

## 2018-12-14 NOTE — Progress Notes (Signed)
   Subjective:    Patient ID: Lacey Cook, female    DOB: 07-Oct-1955, 63 y.o.   MRN: [id number]   CC: medication review  HPI:  Spoke with patient and daughter about medications prescribed at last visit.  Venlafaxine- patient states that she is feeling better. She is less tearful and has more days of feeling happy. She would like a stronger dose. She denies any negative side effects.  Albuterol inhaler- patient's daughter states that she has not needed the rescue inhaler since receiving it. Denies SOB, chest tightness, cough, or other sick symptoms.   Nystatin cream- patient has had slow improvement of her intertrigo. It is still present but not irritating. Daughter has also been using an OTC powder and trying to dry skin creases after bathing. Patient denies discomfort.   Knee pain (likely OA)- patient has been doing much better with the bedside commode so that she does not have to climb the stairs multiple times per day to use the bathroom. She has not had any more occurences of incontinence. Patient is set up for PT sessions to begin this week. Has not attended a session but appears optimistic.   Reviewed another medication on patient's list: oxcarbazepine. Patient and daughter do not remember taking this medication and deny any history of seizures. Patient has not taken the medication for over 5 years that the daughter has been aware. In our discussion, we decided to not continue the medication, but to be cautious of observation of seizure like activity.   Smoking status reviewed   ROS: pertinent noted in the HPI   Past Medical History:  Diagnosis Date  . Chronic right hip pain   . Diabetes mellitus without complication ([place])   . Hypertension   . Renal disorder   . Seizures ([place])   . Stroke [facility])    Past medical history, surgical, family, and social history reviewed and updated in the EMR as appropriate.  Objective:  BP 105/62   Pulse 71   Temp 98.7 F (37.1 C) (Oral)    SpO2 98%   Vitals and nursing note reviewed  General: NAD, pleasant, able to participate in exam Skin: warm and dry, no rashes noted Neuro: alert, no obvious focal deficits Psych: Normal affect and mood   Assessment & Plan:    Encounter for medication review Reviewed medications this visit. Will increase venlafaxine per patient request. Patient to f/u with PT.  Return in a few weeks for health maintenance visit.    Doristine Mango, [facility] PGY-2

## 2018-12-16 ENCOUNTER — Ambulatory Visit: Payer: Medicaid Other | Attending: Family Medicine | Admitting: Physical Therapy

## 2018-12-16 ENCOUNTER — Encounter: Payer: Self-pay | Admitting: Physical Therapy

## 2018-12-16 ENCOUNTER — Other Ambulatory Visit: Payer: Self-pay

## 2018-12-16 DIAGNOSIS — M1611 Unilateral primary osteoarthritis, right hip: Secondary | ICD-10-CM | POA: Diagnosis present

## 2018-12-16 DIAGNOSIS — R262 Difficulty in walking, not elsewhere classified: Secondary | ICD-10-CM | POA: Diagnosis present

## 2018-12-16 DIAGNOSIS — M25551 Pain in right hip: Secondary | ICD-10-CM | POA: Diagnosis present

## 2018-12-16 NOTE — Therapy (Addendum)
[place], [place], [address] Phone: [phone]   Fax:  [phone]  Physical Therapy Evaluation/Discharge Patient Details  Name: Lacey Cook MRN: [id number] Date of Birth: [birth year] Referring Provider (PT): Doristine Mango, DO   Encounter Date: 12/16/2018  PT End of Session - 12/16/18 1327    Visit Number  1    Authorization Type  MCD- auth submitted 7/22    PT Start Time  1321   pt arrived late   PT Stop Time  1400    PT Time Calculation (min)  39 min    Equipment Utilized During Treatment  Gait belt    Activity Tolerance  Patient limited by pain    Behavior During Therapy  --   pleasant but unaware of symptoms or state of health      Past Medical History:  Diagnosis Date  . Chronic right hip pain   . Diabetes mellitus without complication ([place])   . Hypertension   . Renal disorder   . Seizures ([place])   . Stroke [facility])     History reviewed. No pertinent surgical history.  There were no vitals filed for this visit.   Subjective Assessment - 12/16/18 1328    Subjective  Subjective given by daughter. Rt hip always hurts. Has to take Rx and motrin due to pain. there are days where she cannot get out of bed due to hip pain. bedside commode prescribed for home, only bathroom on second floor. Uses SPC at home. No falls recently but balance is "really bad" but "it depends on what she is doing" Sometimes requires assistance for standing and balance. Reports bilateral LE edema more on the Right.    How long can you walk comfortably?  5 min    Patient Stated Goals  figure out the pain, move a little more, improve safety and independence    Currently in Pain?  Yes   unable to say   Pain Location  Hip    Pain Orientation  Right    Aggravating Factors   standing    Pain Relieving Factors  sit/rest         [facility] PT Assessment - 12/16/18 0001      Assessment   Medical Diagnosis  Rt hip OA     Referring Provider (PT)  Doristine Mango, DO    Onset Date/Surgical Date  --   chronic   Prior Therapy  no      Precautions   Precautions  Fall    Precaution Comments  memory impairment      Restrictions   Weight Bearing Restrictions  No      Balance Screen   Has the patient fallen in the past 6 months  No      [place] residence    Living Arrangements  Children;Other relatives    Additional Comments  only bathroom on second floor, has bedside commode downstairs      Prior Function   Level of Independence  Needs assistance with ADLs;Needs assistance with homemaking;Needs assistance with gait;Needs assistance with transfers      Cognition   Overall Cognitive Status  History of cognitive impairments - at baseline    Memory  Impaired    Memory Impairment  Decreased recall of new information;Decreased long term memory;Decreased short term memory      Sensation   Additional Comments  daughter reports edema bilaterally (Rt>Lt) but not sure if she has  N/T      Posture/Postural Control   Posture Comments  flexed posture, crouched along biomechanical chain      ROM / Strength   AROM / PROM / Strength  Strength      Strength   Overall Strength Comments  difficulty measuring due to understanding, "ouch" on Rt knee      Palpation   Palpation comment  TTP Rt quads, knee, Gr trochanter, SIJ      Ambulation/Gait   Ambulation Distance (Feet)  35 Feet    Assistive device  Straight cane    Gait Pattern  Antalgic;Wide base of support;Lateral trunk lean to left;Shuffle;Decreased weight shift to right    Gait Comments  flexed posture                Objective measurements completed on examination: See above findings.              PT Education - 12/16/18 2235    Education Details  anatomy of condition, POC, HEP, exercise form/rationale, RW vs rollator, ortho MD    Person(s) Educated  Patient;Child(ren)    Methods  Explanation     Comprehension  Verbalized understanding;Need further instruction       PT Short Term Goals - 12/16/18 2244      PT SHORT TERM GOAL #1   Title  pt will be able to perform HEP with assistance at home as it has been established    Baseline  will progress as appropriate    Time  3    Period  Weeks    Status  New    Target Date  01/08/19      PT SHORT TERM GOAL #2   Title  pt will demonstrate safe ambulation with rolling walker for short distances necessary for home ambulation    Baseline  will educate as appropriate    Time  3    Period  Weeks    Status  New    Target Date  01/08/19      PT SHORT TERM GOAL #3   Title  pt will demonstrate safe, independent transfer to and from bedside commode    Baseline  requires assistance at eval, commode is a recent addition at home    Time  3    Period  Weeks    Status  New    Target Date  01/08/19        PT Long Term Goals - 12/16/18 2249      PT LONG TERM GOAL #1   Title  LTG to be set at re-evalution             Plan - 12/16/18 2236    Clinical Impression Statement  Pt presents to PT with her daughter, who says she will be at each appointment, with complaints of Rt hip pain that is limiting functional ambulation. Pt was able walk for 35 feet in clinic with [facility] before asking for a rest break, CGA provided at all times with gait belt for safety. I do recommend a rolling walker rather than a rollator for safety at this time. I also recommend discussing possible surgical options with ortho surgeon as the arthritis in her hip that is present is significant and in a heavy weight bearing aspect of the FA joint. We discussed implications of surgery and daughter verbalized understanding. Pt will benefit from skilled PT in order to improve gross strength, mobility and safety to meet long term goals.  Personal Factors and Comorbidities  Comorbidity 1;Fitness    Comorbidities  stroke, seizures, memory impairment    Examination-Activity  Limitations  Bathing;Bed Mobility;Sit;Sleep;Bend;Continence;Stairs;Dressing;Stand;Toileting;Hygiene/Grooming;Locomotion Level;Transfers    Examination-Participation Restrictions  Other   functional mobility, slef care   Stability/Clinical Decision Making  Unstable/Unpredictable    Clinical Decision Making  High    Rehab Potential  Fair    PT Frequency  --   3 visits in first auth & will re-evaluate following MCD authorization   PT Treatment/Interventions  ADLs/Self Care Home Management;Cryotherapy;Gait training;DME Instruction;Moist Heat;Stair training;Functional mobility training;Therapeutic activities;Therapeutic exercise;Balance training;Neuromuscular re-education;Manual techniques;Patient/family education;Passive range of motion;Taping    PT Next Visit Plan  gross LE strengthening/stretching- simple and safe for home performance    PT Home Exercise Plan  obtain RW, trying depends    Consulted and Agree with Plan of Care  Patient;Family member/caregiver    Family Member Consulted  daughter       Patient will benefit from skilled therapeutic intervention in order to improve the following deficits and impairments:  Abnormal gait, Difficulty walking, Obesity, Increased muscle spasms, Decreased endurance, Decreased safety awareness, Decreased activity tolerance, Impaired perceived functional ability, Pain, Improper body mechanics, Impaired flexibility, Decreased knowledge of use of DME, Decreased balance, Decreased strength, Postural dysfunction  Visit Diagnosis: 1. Osteoarthritis of right hip, unspecified osteoarthritis type   2. Pain in right hip   3. Difficulty in walking, not elsewhere classified        Problem List Patient Active Problem List   Diagnosis Date Noted  . Arthritis 11/30/2018  . Intertriginous dermatitis associated with moisture 11/30/2018  . Asthma 11/30/2018  . Depression, recurrent ([place]) 11/30/2018  . Adjustment disorder with disturbance of emotion 12/07/2017    Maximiano Lott C. Chimere Klingensmith PT, DPT 12/16/18 10:55 PM   [facility] [address] [place], [place], [address] Phone: [phone]   Fax:  [phone]  Name: Lacey Cook MRN: [id number] Date of Birth: Dec 17, 1955 PHYSICAL THERAPY DISCHARGE SUMMARY  Visits from Start of Care: 1  Current functional level related to goals / functional outcomes: See above   Remaining deficits: See above   Education / Equipment: Anatomy of condition, POC, HEP, exercise form/rationale  Plan: Patient agrees to discharge.  Patient goals were not met. Patient is being discharged due to not returning since the last visit.  ?????    Outside of Medicaid authorization, will require new referral to return.   Malaika Arnall C. Luken Shadowens PT, DPT 01/14/19 3:47 PM

## 2018-12-17 DIAGNOSIS — Z79899 Other long term (current) drug therapy: Secondary | ICD-10-CM | POA: Insufficient documentation

## 2018-12-17 NOTE — Assessment & Plan Note (Signed)
Reviewed medications this visit. Will increase venlafaxine per patient request. Patient to f/u with PT.  Return in a few weeks for health maintenance visit.

## 2018-12-24 ENCOUNTER — Telehealth: Payer: Self-pay | Admitting: Physical Therapy

## 2018-12-24 ENCOUNTER — Ambulatory Visit: Payer: Medicaid Other | Admitting: Physical Therapy

## 2018-12-24 NOTE — Telephone Encounter (Signed)
Called number in the chart, it is patients daughter.  She apologized regarding missing todays appointment.  She forgot.  They plan on being her for next Wed appointment.

## 2018-12-30 ENCOUNTER — Telehealth: Payer: Self-pay | Admitting: Physical Therapy

## 2018-12-30 ENCOUNTER — Ambulatory Visit: Payer: Medicaid Other | Attending: Family Medicine | Admitting: Physical Therapy

## 2018-12-30 NOTE — Telephone Encounter (Signed)
Spoke with patient's daughter- she had to have emergency surgery this morning and forgot to call about the appointment. Says she will be here next Wednesday.  Jalacia Mattila C. Kwane Rohl PT, DPT 12/30/18 2:38 PM

## 2019-01-06 ENCOUNTER — Ambulatory Visit: Payer: Medicaid Other | Admitting: Physical Therapy

## 2019-01-08 NOTE — Addendum Note (Signed)
Addended by: Richarda Osmond on: 01/08/2019 04:37 PM   Modules accepted: Orders

## 2019-01-08 NOTE — Progress Notes (Signed)
Placing order for rolling walker and ortho surgery referral per PT recommendations. See their note for further details.

## 2019-07-21 ENCOUNTER — Inpatient Hospital Stay (HOSPITAL_COMMUNITY)
Admission: EM | Admit: 2019-07-21 | Discharge: 2019-07-29 | DRG: 575 | Disposition: A | Discharge status: Skilled Nursing Facility | Payer: Medicaid Other | Attending: Internal Medicine | Admitting: Internal Medicine

## 2019-07-21 ENCOUNTER — Inpatient Hospital Stay (HOSPITAL_COMMUNITY): Payer: Medicaid Other

## 2019-07-21 ENCOUNTER — Emergency Department (HOSPITAL_COMMUNITY): Payer: Medicaid Other

## 2019-07-21 ENCOUNTER — Other Ambulatory Visit: Payer: Self-pay

## 2019-07-21 ENCOUNTER — Encounter (HOSPITAL_COMMUNITY): Payer: Self-pay | Admitting: Obstetrics and Gynecology

## 2019-07-21 DIAGNOSIS — M25461 Effusion, right knee: Secondary | ICD-10-CM | POA: Diagnosis not present

## 2019-07-21 DIAGNOSIS — R7989 Other specified abnormal findings of blood chemistry: Secondary | ICD-10-CM | POA: Diagnosis not present

## 2019-07-21 DIAGNOSIS — M25552 Pain in left hip: Secondary | ICD-10-CM

## 2019-07-21 DIAGNOSIS — S81002A Unspecified open wound, left knee, initial encounter: Secondary | ICD-10-CM | POA: Diagnosis not present

## 2019-07-21 DIAGNOSIS — Z79899 Other long term (current) drug therapy: Secondary | ICD-10-CM

## 2019-07-21 DIAGNOSIS — G40909 Epilepsy, unspecified, not intractable, without status epilepticus: Secondary | ICD-10-CM | POA: Diagnosis present

## 2019-07-21 DIAGNOSIS — L03116 Cellulitis of left lower limb: Secondary | ICD-10-CM | POA: Diagnosis present

## 2019-07-21 DIAGNOSIS — M170 Bilateral primary osteoarthritis of knee: Secondary | ICD-10-CM | POA: Diagnosis present

## 2019-07-21 DIAGNOSIS — L03115 Cellulitis of right lower limb: Secondary | ICD-10-CM | POA: Diagnosis present

## 2019-07-21 DIAGNOSIS — F0280 Dementia in other diseases classified elsewhere without behavioral disturbance: Secondary | ICD-10-CM | POA: Diagnosis present

## 2019-07-21 DIAGNOSIS — Z9181 History of falling: Secondary | ICD-10-CM

## 2019-07-21 DIAGNOSIS — F329 Major depressive disorder, single episode, unspecified: Secondary | ICD-10-CM | POA: Diagnosis present

## 2019-07-21 DIAGNOSIS — L03119 Cellulitis of unspecified part of limb: Secondary | ICD-10-CM

## 2019-07-21 DIAGNOSIS — G8929 Other chronic pain: Secondary | ICD-10-CM | POA: Diagnosis present

## 2019-07-21 DIAGNOSIS — R627 Adult failure to thrive: Secondary | ICD-10-CM | POA: Diagnosis present

## 2019-07-21 DIAGNOSIS — F339 Major depressive disorder, recurrent, unspecified: Secondary | ICD-10-CM | POA: Diagnosis present

## 2019-07-21 DIAGNOSIS — E119 Type 2 diabetes mellitus without complications: Secondary | ICD-10-CM | POA: Diagnosis present

## 2019-07-21 DIAGNOSIS — Z20822 Contact with and (suspected) exposure to covid-19: Secondary | ICD-10-CM | POA: Diagnosis present

## 2019-07-21 DIAGNOSIS — R7401 Elevation of levels of liver transaminase levels: Secondary | ICD-10-CM | POA: Diagnosis present

## 2019-07-21 DIAGNOSIS — L89890 Pressure ulcer of other site, unstageable: Secondary | ICD-10-CM | POA: Diagnosis present

## 2019-07-21 DIAGNOSIS — X58XXXA Exposure to other specified factors, initial encounter: Secondary | ICD-10-CM | POA: Diagnosis present

## 2019-07-21 DIAGNOSIS — Z8673 Personal history of transient ischemic attack (TIA), and cerebral infarction without residual deficits: Secondary | ICD-10-CM | POA: Diagnosis not present

## 2019-07-21 DIAGNOSIS — R262 Difficulty in walking, not elsewhere classified: Secondary | ICD-10-CM | POA: Diagnosis present

## 2019-07-21 DIAGNOSIS — Z882 Allergy status to sulfonamides status: Secondary | ICD-10-CM | POA: Diagnosis not present

## 2019-07-21 DIAGNOSIS — S90821A Blister (nonthermal), right foot, initial encounter: Secondary | ICD-10-CM | POA: Diagnosis present

## 2019-07-21 DIAGNOSIS — M160 Bilateral primary osteoarthritis of hip: Secondary | ICD-10-CM | POA: Diagnosis present

## 2019-07-21 DIAGNOSIS — M1990 Unspecified osteoarthritis, unspecified site: Secondary | ICD-10-CM | POA: Diagnosis present

## 2019-07-21 DIAGNOSIS — G309 Alzheimer's disease, unspecified: Secondary | ICD-10-CM | POA: Diagnosis present

## 2019-07-21 DIAGNOSIS — L8990 Pressure ulcer of unspecified site, unspecified stage: Secondary | ICD-10-CM | POA: Insufficient documentation

## 2019-07-21 DIAGNOSIS — S81001A Unspecified open wound, right knee, initial encounter: Secondary | ICD-10-CM | POA: Diagnosis not present

## 2019-07-21 DIAGNOSIS — G300 Alzheimer's disease with early onset: Secondary | ICD-10-CM | POA: Diagnosis not present

## 2019-07-21 HISTORY — DX: Family history of other specified conditions: Z84.89

## 2019-07-21 LAB — COMPREHENSIVE METABOLIC PANEL
ALT: 25 U/L (ref 0–44)
AST: 75 U/L — ABNORMAL HIGH (ref 15–41)
Albumin: 2.9 g/dL — ABNORMAL LOW (ref 3.5–5.0)
Alkaline Phosphatase: 57 U/L (ref 38–126)
Anion gap: 8 (ref 5–15)
BUN: 17 mg/dL (ref 8–23)
CO2: 28 mmol/L (ref 22–32)
Calcium: 8.3 mg/dL — ABNORMAL LOW (ref 8.9–10.3)
Chloride: 104 mmol/L (ref 98–111)
Creatinine, Ser: 0.58 mg/dL (ref 0.44–1.00)
GFR calc Af Amer: >60 mL/min (ref >60)
GFR calc non Af Amer: >60 mL/min (ref >60)
Glucose, Bld: 123 mg/dL — ABNORMAL HIGH (ref 70–99)
Potassium: 3.5 mmol/L (ref 3.5–5.1)
Sodium: 140 mmol/L (ref 135–145)
Total Bilirubin: 1.4 mg/dL — ABNORMAL HIGH (ref 0.3–1.2)
Total Protein: 6.3 g/dL — ABNORMAL LOW (ref 6.5–8.1)

## 2019-07-21 LAB — CBC
HCT: 40.3 % (ref 36.0–46.0)
Hemoglobin: 13.6 g/dL (ref 12.0–15.0)
MCH: 32.3 pg (ref 26.0–34.0)
MCHC: 33.7 g/dL (ref 30.0–36.0)
MCV: 95.7 fL (ref 80.0–100.0)
Platelets: 210 10*3/uL (ref 150–400)
RBC: 4.21 MIL/uL (ref 3.87–5.11)
RDW: 12.3 % (ref 11.5–15.5)
WBC: 8.1 10*3/uL (ref 4.0–10.5)
nRBC: 0 % (ref 0.0–0.2)

## 2019-07-21 LAB — SARS CORONAVIRUS 2 (TAT 6-24 HRS): SARS Coronavirus 2: NEGATIVE

## 2019-07-21 LAB — TSH: TSH: 2.995 u[IU]/mL (ref 0.350–4.500)

## 2019-07-21 MED ORDER — MELATONIN 3 MG PO TABS
3.0000 mg | ORAL_TABLET | Freq: Every day | ORAL | Status: DC
  Administered 2019-07-22 – 2019-07-28 (×8): 3 mg via ORAL
  Filled 2019-07-21 (×8): qty 1

## 2019-07-21 MED ORDER — HYDROCODONE-ACETAMINOPHEN 5-325 MG PO TABS
1.0000 | ORAL_TABLET | ORAL | Status: DC | PRN
  Administered 2019-07-22 (×4): 1 via ORAL
  Administered 2019-07-23: 2 via ORAL
  Administered 2019-07-24 – 2019-07-25 (×3): 1 via ORAL
  Administered 2019-07-25: 2 via ORAL
  Administered 2019-07-26 – 2019-07-29 (×8): 1 via ORAL
  Filled 2019-07-21 (×5): qty 1
  Filled 2019-07-21: qty 2
  Filled 2019-07-21 (×10): qty 1
  Filled 2019-07-21 (×3): qty 2

## 2019-07-21 MED ORDER — POLYETHYLENE GLYCOL 3350 17 G PO PACK: 17.0000 g | PACK | Freq: Every day | ORAL | Status: DC | PRN

## 2019-07-21 MED ORDER — VENLAFAXINE HCL 75 MG PO TABS
75.0000 mg | ORAL_TABLET | Freq: Two times a day (BID) | ORAL | Status: DC
  Administered 2019-07-21 – 2019-07-29 (×15): 75 mg via ORAL
  Filled 2019-07-21 (×17): qty 1

## 2019-07-21 MED ORDER — ACETAMINOPHEN 650 MG RE SUPP: 650.0000 mg | Freq: Four times a day (QID) | RECTAL | Status: DC | PRN

## 2019-07-21 MED ORDER — ONDANSETRON HCL 4 MG PO TABS
4.0000 mg | ORAL_TABLET | Freq: Four times a day (QID) | ORAL | Status: DC | PRN
  Administered 2019-07-24: 4 mg via ORAL
  Filled 2019-07-21: qty 1

## 2019-07-21 MED ORDER — ACETAMINOPHEN 325 MG PO TABS
650.0000 mg | ORAL_TABLET | Freq: Once | ORAL | Status: AC
  Administered 2019-07-21: 16:00:00 650 mg via ORAL
  Filled 2019-07-21: qty 2

## 2019-07-21 MED ORDER — SODIUM CHLORIDE 0.9 % IV SOLN
1.0000 g | Freq: Every day | INTRAVENOUS | Status: DC
  Administered 2019-07-21 – 2019-07-29 (×9): 1 g via INTRAVENOUS
  Filled 2019-07-21 (×9): qty 1

## 2019-07-21 MED ORDER — ENOXAPARIN SODIUM 40 MG/0.4ML ~~LOC~~ SOLN
40.0000 mg | Freq: Every day | SUBCUTANEOUS | Status: DC
  Administered 2019-07-21 – 2019-07-24 (×4): 40 mg via SUBCUTANEOUS
  Filled 2019-07-21 (×5): qty 0.4

## 2019-07-21 MED ORDER — ACETAMINOPHEN 325 MG PO TABS
650.0000 mg | ORAL_TABLET | Freq: Four times a day (QID) | ORAL | Status: DC | PRN
  Administered 2019-07-23 – 2019-07-29 (×3): 650 mg via ORAL
  Filled 2019-07-21 (×4): qty 2

## 2019-07-21 MED ORDER — ONDANSETRON HCL 4 MG/2ML IJ SOLN
4.0000 mg | Freq: Four times a day (QID) | INTRAMUSCULAR | Status: DC | PRN
  Administered 2019-07-22 – 2019-07-27 (×4): 4 mg via INTRAVENOUS
  Filled 2019-07-21 (×5): qty 2

## 2019-07-21 MED ORDER — SODIUM CHLORIDE 0.9 % IV SOLN: INTRAVENOUS | Status: AC

## 2019-07-21 MED ORDER — MUSCLE RUB 10-15 % EX CREA
TOPICAL_CREAM | CUTANEOUS | Status: DC | PRN
  Administered 2019-07-29: 1 via TOPICAL
  Filled 2019-07-21: qty 85

## 2019-07-21 MED ORDER — IBUPROFEN 200 MG PO TABS
400.0000 mg | ORAL_TABLET | Freq: Once | ORAL | Status: AC
  Administered 2019-07-21: 16:00:00 400 mg via ORAL
  Filled 2019-07-21: qty 2

## 2019-07-21 MED ORDER — SODIUM CHLORIDE 0.9% FLUSH
3.0000 mL | Freq: Once | INTRAVENOUS | Status: AC
  Administered 2019-07-21: 15:00:00 3 mL via INTRAVENOUS

## 2019-07-21 MED ORDER — ALBUTEROL SULFATE (2.5 MG/3ML) 0.083% IN NEBU
2.5000 mg | INHALATION_SOLUTION | Freq: Four times a day (QID) | RESPIRATORY_TRACT | Status: DC | PRN
  Administered 2019-07-28: 2.5 mg via RESPIRATORY_TRACT
  Filled 2019-07-21: qty 3

## 2019-07-21 NOTE — ED Notes (Signed)
RN and NT attempted to in and out cath patient. Unable to obtain urine from in and out cath as patient is too clenched. MD reports she will order pain medication.

## 2019-07-21 NOTE — ED Provider Notes (Signed)
[facility] DEPT Provider Note   CSN: [id number] Arrival date & time: 07/21/19  1326     History Chief Complaint  Patient presents with  . Failure To Thrive    Lacey Cook is a 64 y.o. female who is presenting via private vehicle for evaluation of pain and poor appetite after sustaining a fall approximately 1w ago.  Patient has a history of dementia and daughter insists that she must be the primary historian. Patient does nod and shake her head to answer some questions. Patient resides with her daughter and granddaughter who are the primary caregivers. The fall that occurred one week ago was unwitnessed. It was only discovered after patient's granddaughter found her crawling around on the floor for a few days. Prior the the fall, she was able to ambulate independently to the bathroom. Since the fall, she is unable to stand, even with assistance, due to hip pain. Additionally, she sustained fairly significant wounds to her bilateral knees from crawling around on the floor. She also has a wound on her heel from crossing her legs which seems to somewhat improve the hip pain. The daughter reports to have been cleaning the wounds with hydrogen peroxide however have progressed over the past few days and began draining. No known fevers. The daughter does not believe that her mental status has changed since the fall.   LEVEL 5 CAVEAT  Past Medical History:  Diagnosis Date  . Chronic right hip pain   . Diabetes mellitus without complication ([place])   . Hypertension   . Renal disorder   . Seizures ([place])   . Stroke [facility])     Patient Active Problem List   Diagnosis Date Noted  . Cellulitis of knee 07/21/2019  . Encounter for medication review 12/17/2018  . Arthritis 11/30/2018  . Intertriginous dermatitis associated with moisture 11/30/2018  . Asthma 11/30/2018  . Depression, recurrent ([place]) 11/30/2018  . Adjustment disorder with disturbance of emotion  12/07/2017    History reviewed. No pertinent surgical history.   OB History   No obstetric history on file.     No family history on file.  Social History   Tobacco Use  . Smoking status: Never Smoker  . Smokeless tobacco: Never Used  Substance Use Topics  . Alcohol use: No  . Drug use: Never    Home Medications Prior to Admission medications   Medication Sig Start Date End Date Taking? Authorizing Provider  acetaminophen (TYLENOL) 500 MG tablet Take 1,000 mg by mouth every 6 (six) hours as needed for moderate pain.   Yes [provider]  albuterol (VENTOLIN HFA) 108 (90 Base) MCG/ACT inhaler Inhale 2 puffs into the lungs every 6 (six) hours as needed for wheezing or shortness of breath. 11/24/18  Yes Anderson, Chelsey L, DO  venlafaxine (EFFEXOR) 75 MG tablet Take 1 tablet (75 mg total) by mouth 2 (two) times daily. 12/14/18  Yes Anderson, Chelsey L, DO  fluticasone (FLONASE) 50 MCG/ACT nasal spray Place 1 spray into both nostrils daily. Patient not taking: Reported on 12/16/2018 07/26/17   Langston Masker B, PA-C  nystatin cream (MYCOSTATIN) Apply 1 application topically 2 (two) times daily. Patient not taking: Reported on 07/21/2019 11/24/18   Doristine Mango L, DO    Allergies    Shellfish allergy and Sulfa antibiotics  Review of Systems   Review of Systems  Unable to perform ROS: Dementia  LEVEL 5 CAVEAT  Physical Exam Updated Vital Signs BP 120/73 (BP Location: Right Arm)  Pulse (!) 108   Temp 98.2 F (36.8 C)   Resp 17   SpO2 100%   Physical Exam Constitutional:      Comments: Chronically ill appearing.  HENT:     Head: Atraumatic.  Cardiovascular:     Rate and Rhythm: Normal rate and regular rhythm.  Pulmonary:     Effort: Pulmonary effort is normal.     Breath sounds: Normal breath sounds.  Abdominal:     General: Bowel sounds are normal.  Musculoskeletal:     Comments: No obvious deformity to the lower extremities. Exam limited due to  pain. Left leg is rotated externally with knee flexion. Pain with any attempt to manipulate the leg straight. Did attempt to ambulate her however was unable to even move her leg without her experiencing excruciating pain.  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 Erythema, edema and yellow drainage present L>R. Tender to palpation.  A quarter sized blister is also present on the right heel. No drainage from that site.  Neurological:     Mental Status: She is disoriented.     Cranial Nerves: No cranial nerve deficit.  Psychiatric:        Mood and Affect: Mood normal.     ED Results / Procedures / Treatments   Labs (all labs ordered are listed, but only abnormal results are displayed) Labs Reviewed  COMPREHENSIVE METABOLIC PANEL - Abnormal; Notable for the following components:      Result Value   Glucose, Bld 123 (*)    Calcium 8.3 (*)    Total Protein 6.3 (*)    Albumin 2.9 (*)    AST 75 (*)    Total Bilirubin 1.4 (*)    All other components within normal limits  URINE CULTURE  SARS CORONAVIRUS 2 (TAT 6-24 HRS)  CBC  URINALYSIS, ROUTINE W REFLEX MICROSCOPIC    EKG None  Radiology CT Head Wo Contrast  Result Date: 07/21/2019 CLINICAL DATA:  Head trauma. Altered mental status. Failure to thrive. EXAM: CT HEAD WITHOUT CONTRAST TECHNIQUE: Contiguous axial images were obtained from the base of the skull through the vertex without intravenous contrast. COMPARISON:  04/25/2017 FINDINGS: Brain: There is progressive prominent volume loss in the left frontal lobe with milder volume loss in the right frontal lobe. Asymmetric volume loss is also present in the left temporal lobe. Hypodensities in the cerebral white matter bilaterally are similar to the prior study and nonspecific but compatible with mild chronic small vessel ischemic disease. There is no evidence of acute infarct, intracranial hemorrhage, mass, midline shift, or extra-axial fluid collection. Vascular:  Calcified atherosclerosis at the skull base. No hyperdense vessel. Skull: No fracture or suspicious osseous lesion. Sinuses/Orbits: Mild mucosal thickening in the maxillary sinuses. Clear mastoid air cells. Unremarkable orbits. Other: None. IMPRESSION: 1. No evidence of acute intracranial abnormality. 2. Progressive asymmetric atrophy involving the left frontal and temporal lobes. Electronically Signed   By: Logan Bores M.D.   On: 07/21/2019 16:06   DG Knee Complete 4 Views Left  Result Date: 07/21/2019 CLINICAL DATA:  Pain. EXAM: LEFT KNEE - COMPLETE 4+ VIEW COMPARISON:  None. FINDINGS: There are advanced degenerative changes of the left knee, greatest within the medial and patellofemoral compartments. There is no acute displaced fracture. No dislocation. There is a small suprapatellar joint effusion. IMPRESSION: 1. No acute displaced fracture or dislocation. 2. Advanced degenerative changes of the left knee. Electronically Signed   By: Jamie Kato.D.  On: 07/21/2019 16:07   DG Knee Complete 4 Views Right  Result Date: 07/21/2019 CLINICAL DATA:  Pain EXAM: RIGHT KNEE - COMPLETE 4+ VIEW COMPARISON:  None. FINDINGS: There are advanced degenerative changes of the right knee, greatest within the medial and patellofemoral compartments. There is no acute displaced fracture. No dislocation. There is a small to moderate-sized joint effusion. IMPRESSION: 1. No acute displaced fracture or dislocation. 2. Small to moderate-sized joint effusion. 3. Advanced degenerative changes. Electronically Signed   By: Constance Holster M.D.   On: 07/21/2019 16:10   DG Hip Unilat With Pelvis 2-3 Views Left  Result Date: 07/21/2019 CLINICAL DATA:  Pain status post fall EXAM: DG HIP (WITH OR WITHOUT PELVIS) 2-3V LEFT COMPARISON:  None. FINDINGS: There are end-stage degenerative changes of the right hip. There is mild-to-moderate osteoarthritis of the left hip. No acute displaced fracture or dislocation. There is a  moderate to large amount of stool at the level of the rectum. IMPRESSION: 1. No acute displaced fracture or dislocation. 2. Severe degenerative changes of the right hip. 3. Mild-to-moderate osteoarthritis of the left hip. Electronically Signed   By: Constance Holster M.D.   On: 07/21/2019 16:07     Medications Ordered in ED Medications  sodium chloride flush (NS) 0.9 % injection 3 mL (3 mLs Intravenous Given 07/21/19 1451)  ibuprofen (ADVIL) tablet 400 mg (400 mg Oral Given 07/21/19 1626)  acetaminophen (TYLENOL) tablet 650 mg (650 mg Oral Given 07/21/19 1626)    ED Course  I have reviewed the triage vital signs and the nursing notes.  Pertinent labs & imaging results that were available during my care of the patient were reviewed by me and considered in my medical decision making (see chart for details).    MDM Rules/Calculators/A&P                      64 yo female with dementia who presents by private vehicle with her daughter with whom she resides. Overall, I have several concerns regarding her situation. Medically, I think we need to r/o fractures of her hip and knees. Since the fall was unwitnessed, will also obtain a head CT. Additionally, her knee wounds are clearly infected. Unclear if this is cellulitis or has progressed to deeper tissues.  Will need evaluation for infection as well.  I am also concerned about her overall living situation. Her daughter's decision to allow her to crawl around on the floor and not bring her in until this point is questionable. She will likely require social work consult while inpatient to further evaluate the safety situation at home.  4:46 PM no leukocytosis. No significant electrolyte derangements. Labs do suggest possibly some dehydration. IVF running. Awaiting reads on imaging.   4:46 PM no significant acute findings on imaging. Since she is unable ambulate since the fall, she will likely need further evaluation with MRI.  4:46 PM will need  to admit for further imaging of the leg pain. Unfortunately, unsure if she would be able to sit still long enough for MRI so will likely need CT. Additionally, will need antibiotics for the cellulitis of her knees. Her home situation is concerning that she may be unable to receive the care at home as evidenced over the past week.  Hospitalist service agrees to admit for the above issues.  Final Clinical Impression(s) / ED Diagnoses Final diagnoses:  Left hip pain  Cellulitis of knee    Rx / DC Orders ED Discharge Orders  None       Mitzi Hansen, MD 07/21/19 1647    Little, Wenda Overland, MD 07/22/19 2031

## 2019-07-21 NOTE — H&P (Signed)
History and Physical    Lacey Cook [id number] DOB: 1956-05-19 DOA: 07/21/2019  PCP: Richarda Osmond, DO  Patient coming from: Home  I have personally briefly reviewed patient's old medical records in [place]  Chief Complaint: Bilateral knee edema, erythema, unable to walk  HPI: Lacey Cook is a 64 y.o. female with medical history significant of Alzheimer's dementia, depression who presents to the ED with bilateral knee pain associated with swelling, redness, and inability to walk.  Patient is accompanied by her daughter, who gives the majority of their HPI due to her underlying dementia.  Apparently patient fell 1 week ago, and has since been crawling on her knees.  At baseline she is able to ambulate with the use of a walker, but has not been unable to over the past 1 week.  Also, patient has not been eating well over the past day.  She has severe osteoarthritis, especially of the right hip.  Per daughter's report, she is been told that she is not an operative candidate due to her underlying Alzheimer's dementia.  ED Course: Temperature 98.2, HR 108, RR 17, BP 120/73, SPO2 100% on room air.  The BC count 8.1, hemoglobin 13.6, platelet count 210.  Sodium 140, potassium 3.5, chloride 104, CO2 28, BUN 17, creatinine 0.58.  Glucose 123.  Albumin 2.9.  AST 75, ALT 25, total bili 1.4.  CT head with no acute intracranial abnormalities but did note progressive asymmetric atrophy of the left frontal and temporal lobe.  Right hip x-ray with no acute fracture/dislocation with severe degenerative changes of the right hip.  Mild osteoarthritis left hip.  Left knee x-ray with no acute fracture/dislocation, advanced degenerative changes left knee.  Right knee x-ray with no acute displaced fracture/dislocation, small/moderate sized joint effusion, advanced degenerative changes.  Urinalysis and urine culture: Pending CT left hip: Pending.  ED referred for admission given cellulitis and inability  to walk.  Review of Systems: As per HPI otherwise 10 point review of systems negative.    Past Medical History:  Diagnosis Date  . Chronic right hip pain   . Diabetes mellitus without complication ([place])   . Hypertension   . Renal disorder   . Seizures ([place])   . Stroke [facility])     History reviewed. No pertinent surgical history.   reports that she has never smoked. She has never used smokeless tobacco. She reports that she does not drink alcohol or use drugs.  Allergies  Allergen Reactions  . Shellfish Allergy Anaphylaxis    Throat swells  . Sulfa Antibiotics Anaphylaxis    Throat Swelling    No family history on file.  Family history reviewed and not pertinent   Prior to Admission medications   Medication Sig Start Date End Date Taking? Authorizing Provider  acetaminophen (TYLENOL) 500 MG tablet Take 1,000 mg by mouth every 6 (six) hours as needed for moderate pain.   Yes [provider]  albuterol (VENTOLIN HFA) 108 (90 Base) MCG/ACT inhaler Inhale 2 puffs into the lungs every 6 (six) hours as needed for wheezing or shortness of breath. 11/24/18  Yes Anderson, Chelsey L, DO  venlafaxine (EFFEXOR) 75 MG tablet Take 1 tablet (75 mg total) by mouth 2 (two) times daily. 12/14/18  Yes Anderson, Chelsey L, DO  fluticasone (FLONASE) 50 MCG/ACT nasal spray Place 1 spray into both nostrils daily. Patient not taking: Reported on 12/16/2018 07/26/17   Langston Masker B, PA-C  nystatin cream (MYCOSTATIN) Apply 1 application topically 2 (two)  times daily. Patient not taking: Reported on 07/21/2019 11/24/18   Richarda Osmond, DO    Physical Exam: Vitals:   07/21/19 1343 07/21/19 1709  BP: 120/73 131/79  Pulse: (!) 108 (!) 104  Resp: 17 16  Temp: 98.2 F (36.8 C)   SpO2: 100% 99%    Constitutional: NAD, calm, comfortable, pleasantly confused Eyes: PERRL, lids and conjunctivae normal ENMT: Mucous membranes are moist. Posterior pharynx clear of any exudate or lesions.Normal  dentition.  Neck: normal, supple, no masses, no thyromegaly Respiratory: clear to auscultation bilaterally, no wheezing, no crackles. Normal respiratory effort. No accessory muscle use.  Oxygenating well on room air. Cardiovascular: Regular rate and rhythm, no murmurs / rubs / gallops. No extremity edema. 2+ pedal pulses. No carotid bruits.  Abdomen: no tenderness, no masses palpated. No hepatosplenomegaly. Bowel sounds positive.  Musculoskeletal: no clubbing / cyanosis. No joint deformity upper and lower extremities. Good ROM, no contractures. Normal muscle tone.  Skin: Bilateral anterior knees with Asher's present with surrounding erythema, no fluctuance, right heel with blister (see pictures below) Neurologic: CN 2-12 grossly intact. Sensation intact, DTR normal. Strength 5/5 in all 4.  Psychiatric: Poor judgment and insight. Alert.  Not oriented to person/place/time/situation. Normal mood.             Labs on Admission: I have personally reviewed following labs and imaging studies  CBC: Recent Labs  Lab 07/21/19 1432  WBC 8.1  HGB 13.6  HCT 40.3  MCV 95.7  PLT [id number]   Basic Metabolic Panel: Recent Labs  Lab 07/21/19 1425  NA 140  K 3.5  CL 104  CO2 28  GLUCOSE 123*  BUN 17  CREATININE 0.58  CALCIUM 8.3*   GFR: CrCl cannot be calculated (Unknown ideal weight.). Liver Function Tests: Recent Labs  Lab 07/21/19 1425  AST 75*  ALT 25  ALKPHOS 57  BILITOT 1.4*  PROT 6.3*  ALBUMIN 2.9*   No results for input(s): LIPASE, AMYLASE in the last 168 hours. No results for input(s): AMMONIA in the last 168 hours. Coagulation Profile: No results for input(s): INR, PROTIME in the last 168 hours. Cardiac Enzymes: No results for input(s): CKTOTAL, CKMB, CKMBINDEX, TROPONINI in the last 168 hours. BNP (last 3 results) No results for input(s): PROBNP in the last 8760 hours. HbA1C: No results for input(s): HGBA1C in the last 72 hours. CBG: No results for input(s):  GLUCAP in the last 168 hours. Lipid Profile: No results for input(s): CHOL, HDL, LDLCALC, TRIG, CHOLHDL, LDLDIRECT in the last 72 hours. Thyroid Function Tests: No results for input(s): TSH, T4TOTAL, FREET4, T3FREE, THYROIDAB in the last 72 hours. Anemia Panel: No results for input(s): VITAMINB12, FOLATE, FERRITIN, TIBC, IRON, RETICCTPCT in the last 72 hours. Urine analysis:    Component Value Date/Time   COLORURINE AMBER (A) 07/20/2018 1704   APPEARANCEUR HAZY (A) 07/20/2018 1704   LABSPEC 1.032 (H) 07/20/2018 1704   PHURINE 5.0 07/20/2018 1704   GLUCOSEU NEGATIVE 07/20/2018 1704   HGBUR NEGATIVE 07/20/2018 1704   BILIRUBINUR NEGATIVE 07/20/2018 1704   KETONESUR 5 (A) 07/20/2018 1704   PROTEINUR NEGATIVE 07/20/2018 1704   NITRITE NEGATIVE 07/20/2018 1704   LEUKOCYTESUR NEGATIVE 07/20/2018 1704    Radiological Exams on Admission: CT Head Wo Contrast  Result Date: 07/21/2019 CLINICAL DATA:  Head trauma. Altered mental status. Failure to thrive. EXAM: CT HEAD WITHOUT CONTRAST TECHNIQUE: Contiguous axial images were obtained from the base of the skull through the vertex without intravenous contrast. COMPARISON:  04/25/2017  FINDINGS: Brain: There is progressive prominent volume loss in the left frontal lobe with milder volume loss in the right frontal lobe. Asymmetric volume loss is also present in the left temporal lobe. Hypodensities in the cerebral white matter bilaterally are similar to the prior study and nonspecific but compatible with mild chronic small vessel ischemic disease. There is no evidence of acute infarct, intracranial hemorrhage, mass, midline shift, or extra-axial fluid collection. Vascular: Calcified atherosclerosis at the skull base. No hyperdense vessel. Skull: No fracture or suspicious osseous lesion. Sinuses/Orbits: Mild mucosal thickening in the maxillary sinuses. Clear mastoid air cells. Unremarkable orbits. Other: None. IMPRESSION: 1. No evidence of acute intracranial  abnormality. 2. Progressive asymmetric atrophy involving the left frontal and temporal lobes. Electronically Signed   By: Logan Bores M.D.   On: 07/21/2019 16:06   CT Hip Left Wo Contrast  Result Date: 07/21/2019 CLINICAL DATA:  Poly trauma. Unable to walk. Left hip pain. EXAM: CT OF THE LEFT HIP WITHOUT CONTRAST TECHNIQUE: Multidetector CT imaging of the left hip was performed according to the standard protocol. Multiplanar CT image reconstructions were also generated. COMPARISON:  Radiographs 07/21/2019 FINDINGS: The left hip is normally located. No hip fracture or AVN. The acetabulum is intact. No left-sided pelvic fractures are identified. The pubic symphysis and visualized left SI joint are intact. Moderate subcutaneous inflammation/edema/fluid or hematoma likely representing a direct contusion. No obvious intramuscular hematoma to suggest a significant muscle tear. Large amount of stool in the distended rectum. IMPRESSION: 1. No left hip fracture or AVN. 2. Moderate subcutaneous inflammation/edema/fluid or hematoma likely representing a direct contusion. No obvious intramuscular hematoma to suggest a significant muscle tear. 3. Large amount of stool in the distended rectum. Electronically Signed   By: Marijo Sanes M.D.   On: 07/21/2019 17:03   DG Knee Complete 4 Views Left  Result Date: 07/21/2019 CLINICAL DATA:  Pain. EXAM: LEFT KNEE - COMPLETE 4+ VIEW COMPARISON:  None. FINDINGS: There are advanced degenerative changes of the left knee, greatest within the medial and patellofemoral compartments. There is no acute displaced fracture. No dislocation. There is a small suprapatellar joint effusion. IMPRESSION: 1. No acute displaced fracture or dislocation. 2. Advanced degenerative changes of the left knee. Electronically Signed   By: Constance Holster M.D.   On: 07/21/2019 16:07   DG Knee Complete 4 Views Right  Result Date: 07/21/2019 CLINICAL DATA:  Pain EXAM: RIGHT KNEE - COMPLETE 4+ VIEW  COMPARISON:  None. FINDINGS: There are advanced degenerative changes of the right knee, greatest within the medial and patellofemoral compartments. There is no acute displaced fracture. No dislocation. There is a small to moderate-sized joint effusion. IMPRESSION: 1. No acute displaced fracture or dislocation. 2. Small to moderate-sized joint effusion. 3. Advanced degenerative changes. Electronically Signed   By: Constance Holster M.D.   On: 07/21/2019 16:10   DG Hip Unilat With Pelvis 2-3 Views Left  Result Date: 07/21/2019 CLINICAL DATA:  Pain status post fall EXAM: DG HIP (WITH OR WITHOUT PELVIS) 2-3V LEFT COMPARISON:  None. FINDINGS: There are end-stage degenerative changes of the right hip. There is mild-to-moderate osteoarthritis of the left hip. No acute displaced fracture or dislocation. There is a moderate to large amount of stool at the level of the rectum. IMPRESSION: 1. No acute displaced fracture or dislocation. 2. Severe degenerative changes of the right hip. 3. Mild-to-moderate osteoarthritis of the left hip. Electronically Signed   By: Constance Holster M.D.   On: 07/21/2019 16:07  EKG: Independently reviewed.   Assessment/Plan Principal Problem:   Cellulitis of knee Active Problems:   Arthritis   Depression, recurrent (HCC)   Effusion of right knee   Alzheimer's dementia (HCC)   Elevated LFTs    Bilateral knee cellulitis Bilateral knee wounds, present on admission Patient presenting from home, following fall 1 week ago.  Since has been crawling on her knees for mobility.  She has now developed significant eschar formation with surrounding cellulitic changes to bilateral knees.  Also noted right heel blister formation.  Patient is afebrile, without leukocytosis. --Admit inpatient --Start ceftriaxone 1 g IV every 24 hours --Wound care consult for evaluation of knee wounds and further recommendations --Follow CBC and fever curve closely  Elevated LFTs AST 75, ALT 25,  total bilirubin 1.4.  No abdominal symptoms. --Check acute hepatitis panel --Trend CMP daily --If develops abdominal discomfort, consider right upper quadrant ultrasound  Chronic right hip pain secondary to advanced osteoarthritis Moderate right knee effusion Weakness, gait disturbance Daughter reports that usually at baseline able to ambulate with the use of a walker, fell 1 week ago and since has been crawling on the knees and unable to ambulate appropriately.  Has severe osteoarthritis, has been told by her PCP apparently that she is not a candidate for any operative management due to her underlying advanced Alzheimer's dementia.  X-rays bilateral hips, and knees with no acute fracture or dislocation, just notable for moderate to severe osteoarthritis with right knee effusion.  Currently patient lives with her daughter, given patient's daughter on medical issues, currently unable to care for patient appropriately at home given her higher needs for ADLs. --Pending CT left hip that was ordered in the ED --PT/OT consultation --Social work consult for discharge planning, anticipate possible SNF need  Advanced Alzheimer's dementia --Supportive care, not on any home medications. --Melatonin 3 mg p.o. nightly --Dementia precautions  Depression: Continue Effexor 75 mg p.o. twice daily   DVT prophylaxis: Lovenox Code Status: Full code Family Communication: Daughter present at bedside Disposition Plan: To be determined, likely will need SNF Consults called: None Admission status: Inpatient   Severity of Illness: The appropriate patient status for this patient is INPATIENT. Inpatient status is judged to be reasonable and necessary in order to provide the required intensity of service to ensure the patient's safety. The patient's presenting symptoms, physical exam findings, and initial radiographic and laboratory data in the context of their chronic comorbidities is felt to place them at high  risk for further clinical deterioration. Furthermore, it is not anticipated that the patient will be medically stable for discharge from the hospital within 2 midnights of admission. The following factors support the patient status of inpatient.   " The patient's presenting symptoms include right hip, bilateral knee pain " The worrisome physical exam findings include eschar formation with surrounding cellulitic changes bilateral knees " The initial radiographic and laboratory data are worrisome because of elevated LFTs " The chronic co-morbidities include Alzheimer's dementia.   * I certify that at the point of admission it is my clinical judgment that the patient will require inpatient hospital care spanning beyond 2 midnights from the point of admission due to high intensity of service, high risk for further deterioration and high frequency of surveillance required.*    Emoree Sasaki J [place] DO [facility] Available via Epic secure chat 7am-7pm After these hours, please refer to coverage provider listed on [email or web address] 07/21/2019, 5:31 PM

## 2019-07-21 NOTE — ED Notes (Signed)
AC made aware in delay in report being called

## 2019-07-21 NOTE — Progress Notes (Signed)
Attempted to call for report. 

## 2019-07-21 NOTE — ED Notes (Signed)
Attempted to call report. Was placed on hold for over 5 minutes without answer. Attempted to call back, phone rang without response.

## 2019-07-21 NOTE — ED Triage Notes (Signed)
Pt reports to the ED via EMS from home. Paitent is reportedly not eating much and has been screaming at her daughter when the daughter tries to clean her up. Patient is short to respond and frequently responds "I don't know" to questions. Patient's daughter reports she is failing to thrive.

## 2019-07-21 NOTE — ED Notes (Signed)
Pt is reported to be incontinent by daughter. RN placed purewick

## 2019-07-22 ENCOUNTER — Other Ambulatory Visit: Payer: Self-pay

## 2019-07-22 LAB — COMPREHENSIVE METABOLIC PANEL
ALT: 25 U/L (ref 0–44)
AST: 58 U/L — ABNORMAL HIGH (ref 15–41)
Albumin: 2.7 g/dL — ABNORMAL LOW (ref 3.5–5.0)
Alkaline Phosphatase: 54 U/L (ref 38–126)
Anion gap: 10 (ref 5–15)
BUN: 19 mg/dL (ref 8–23)
CO2: 24 mmol/L (ref 22–32)
Calcium: 8.1 mg/dL — ABNORMAL LOW (ref 8.9–10.3)
Chloride: 105 mmol/L (ref 98–111)
Creatinine, Ser: 0.52 mg/dL (ref 0.44–1.00)
GFR calc Af Amer: >60 mL/min (ref >60)
GFR calc non Af Amer: >60 mL/min (ref >60)
Glucose, Bld: 113 mg/dL — ABNORMAL HIGH (ref 70–99)
Potassium: 3.4 mmol/L — ABNORMAL LOW (ref 3.5–5.1)
Sodium: 139 mmol/L (ref 135–145)
Total Bilirubin: 1.2 mg/dL (ref 0.3–1.2)
Total Protein: 6 g/dL — ABNORMAL LOW (ref 6.5–8.1)

## 2019-07-22 LAB — HIV ANTIBODY (ROUTINE TESTING W REFLEX): HIV Screen 4th Generation wRfx: NONREACTIVE

## 2019-07-22 LAB — HEPATITIS PANEL, ACUTE
HCV Ab: NONREACTIVE
Hep A IgM: NONREACTIVE
Hep B C IgM: NONREACTIVE
Hepatitis B Surface Ag: NONREACTIVE

## 2019-07-22 LAB — URINALYSIS, ROUTINE W REFLEX MICROSCOPIC
Bilirubin Urine: NEGATIVE
Glucose, UA: NEGATIVE mg/dL
Hgb urine dipstick: NEGATIVE
Ketones, ur: NEGATIVE mg/dL
Leukocytes,Ua: NEGATIVE
Nitrite: NEGATIVE
Protein, ur: NEGATIVE mg/dL
Specific Gravity, Urine: 1.01 (ref 1.005–1.030)
pH: 6 (ref 5.0–8.0)

## 2019-07-22 LAB — CBC
HCT: 38.1 % (ref 36.0–46.0)
Hemoglobin: 12.5 g/dL (ref 12.0–15.0)
MCH: 32.3 pg (ref 26.0–34.0)
MCHC: 32.8 g/dL (ref 30.0–36.0)
MCV: 98.4 fL (ref 80.0–100.0)
Platelets: 173 10*3/uL (ref 150–400)
RBC: 3.87 MIL/uL (ref 3.87–5.11)
RDW: 12.4 % (ref 11.5–15.5)
WBC: 6.2 10*3/uL (ref 4.0–10.5)
nRBC: 0 % (ref 0.0–0.2)

## 2019-07-22 LAB — CK: Total CK: 760 U/L — ABNORMAL HIGH (ref 38–234)

## 2019-07-22 LAB — MAGNESIUM: Magnesium: 2.1 mg/dL (ref 1.7–2.4)

## 2019-07-22 MED ORDER — BISACODYL 10 MG RE SUPP
10.0000 mg | Freq: Once | RECTAL | Status: DC
  Filled 2019-07-22: qty 1

## 2019-07-22 MED ORDER — POTASSIUM CHLORIDE CRYS ER 20 MEQ PO TBCR
40.0000 meq | EXTENDED_RELEASE_TABLET | Freq: Once | ORAL | Status: AC
  Administered 2019-07-22: 40 meq via ORAL
  Filled 2019-07-22: qty 2

## 2019-07-22 MED ORDER — ADULT MULTIVITAMIN W/MINERALS CH
1.0000 | ORAL_TABLET | Freq: Every day | ORAL | Status: DC
  Administered 2019-07-22 – 2019-07-29 (×7): 1 via ORAL
  Filled 2019-07-22 (×7): qty 1

## 2019-07-22 MED ORDER — ENSURE ENLIVE PO LIQD
237.0000 mL | Freq: Two times a day (BID) | ORAL | Status: DC
  Administered 2019-07-22 – 2019-07-28 (×13): 237 mL via ORAL

## 2019-07-22 NOTE — Evaluation (Signed)
Occupational Therapy Evaluation Patient Details Name: Lacey Cook MRN: [id number] DOB: Jan 29, 1956 Today's Date: 07/22/2019    History of Present Illness  64 y.o. female with medical history significant of Alzheimer's dementia, depression who presents to the ED with bilateral knee pain associated with swelling, redness, and inability to walk.(Simultaneous filing. User may not have seen previous data.)   Clinical Impression   Pt admitted with the above diagnoses and presents with below problem list. Pt will benefit from continued acute OT to address the below listed deficits and maximize independence with basic ADLs. Pt with unclear PLOF, unable to obtain history from pt but per chart review she ambulates with a walker and lives with her daughter. Pt currently setup to mod A with UB ADLs, max-total A +2 for LB ADLs, total A with toileting. Pt sat EOB several minutes and completed a couple of grooming tasks. Pt becoming upset with presentation of rw and cues to try to stand. Only able to progress to sitting EOB this session.      Follow Up Recommendations  SNF    Equipment Recommendations  Other (comment);Tub/shower seat    Recommendations for Other Services       Precautions / Restrictions Precautions Precautions: Fall(Simultaneous filing. User may not have seen previous data.) Restrictions Weight Bearing Restrictions: No(Simultaneous filing. User may not have seen previous data.)      Mobility Bed Mobility Overal bed mobility: Needs Assistance Bed Mobility: Supine to Sit;Sit to Supine     Supine to sit: Max assist;+2 for physical assistance;HOB elevated Sit to supine: Mod assist;Max assist;+2 for physical assistance   General bed mobility comments: Pt with posterior lean, actively resisting coming to EOB due to pain, premedicated. Once sitting EOB pt with fair sitting balance and calmer.   Transfers                 General transfer comment: not attempted this  session. pt becoming upset with cues for standing    Balance Overall balance assessment: Needs assistance Sitting-balance support: No upper extremity supported;Feet supported Sitting balance-Leahy Scale: Fair Sitting balance - Comments: sat EOB and completed grooming tasks.                                   ADL either performed or assessed with clinical judgement   ADL Overall ADL's : Needs assistance/impaired Eating/Feeding: Set up;Supervision/ safety;Sitting   Grooming: Sitting;Minimal assistance;Oral care;Brushing hair Grooming Details (indicate cue type and reason): assist for throughness 2/2 cognition Upper Body Bathing: Minimal assistance;Sitting   Lower Body Bathing: Sitting/lateral leans;Maximal assistance;+2 for safety/equipment   Upper Body Dressing : Moderate assistance;Sitting   Lower Body Dressing: Maximal assistance;Sitting/lateral leans;Bed level;Total assistance                 General ADL Comments: able to sit EOB several minutes and engage in ADL tasks. Pt becoming upset with cues for trying to stand (bringing rw to her). Did not attempt to stnad     Vision         Perception     Praxis      Pertinent Vitals/Pain Pain Assessment: Faces Faces Pain Scale: Hurts even more Pain Location: pt grimacing with R LE movement but unable to state where pain is Pain Descriptors / Indicators: Grimacing;Guarding;Moaning Pain Intervention(s): Monitored during session;Limited activity within patient's tolerance;Repositioned;Premedicated before session     Hand Dominance     Extremity/Trunk Assessment Upper  Extremity Assessment Upper Extremity Assessment: Generalized weakness;Difficult to assess due to impaired cognition   Lower Extremity Assessment Lower Extremity Assessment: Defer to PT evaluation   Cervical / Trunk Assessment Cervical / Trunk Assessment: Kyphotic   Communication Communication Communication: No difficulties;Other  (comment)(repetitive, minimal verbalizations "yes" "no" "I don't know.)   Cognition Arousal/Alertness: Awake/alert Behavior During Therapy: Flat affect Overall Cognitive Status: History of cognitive impairments - at baseline                                     General Comments       Exercises     Shoulder Instructions      Home Living Family/patient expects to be discharged to:: Skilled nursing facility(Simultaneous filing. User may not have seen previous data.) Living Arrangements: Children                               Additional Comments: Per chart review, pt lives with her daughter. Unable to obtain home setup and PLOF data from pt.       Prior Functioning/Environment Level of Independence: Needs assistance  Gait / Transfers Assistance Needed: per chart review, pt walking with a rw at baseline ADL's / Homemaking Assistance Needed: assist at least with UB/LB ADLs per clinical judgement. pt with advanced Alzheimer's. Able to comb hair and complete oral care with setup sitting EOB on eval.            OT Problem List: Decreased strength;Decreased activity tolerance;Impaired balance (sitting and/or standing);Decreased knowledge of use of DME or AE;Decreased knowledge of precautions;Decreased cognition;Pain      OT Treatment/Interventions: Self-care/ADL training;DME and/or AE instruction;Therapeutic activities;Patient/family education;Balance training    OT Goals(Current goals can be found in the care plan section) Acute Rehab OT Goals OT Goal Formulation: Patient unable to participate in goal setting Time For Goal Achievement: 08/05/19 Potential to Achieve Goals: Fair  OT Frequency: Min 2X/week   Barriers to D/C:    pt currently needing +2 assist       Co-evaluation PT/OT/SLP Co-Evaluation/Treatment: Yes Reason for Co-Treatment: Necessary to address cognition/behavior during functional activity;For patient/therapist safety;To address  functional/ADL transfers PT goals addressed during session: Mobility/safety with mobility OT goals addressed during session: ADL's and self-care      AM-PAC OT "6 Clicks" Daily Activity     Outcome Measure Help from another person eating meals?: A Little Help from another person taking care of personal grooming?: A Little Help from another person toileting, which includes using toliet, bedpan, or urinal?: Total Help from another person bathing (including washing, rinsing, drying)?: Total Help from another person to put on and taking off regular upper body clothing?: A Lot Help from another person to put on and taking off regular lower body clothing?: Total 6 Click Score: 11   End of Session    Activity Tolerance: Patient tolerated treatment well;Patient limited by pain;Treatment limited secondary to agitation Patient left: in bed;with call bell/phone within reach;with bed alarm set  OT Visit Diagnosis: Unsteadiness on feet (R26.81);History of falling (Z91.81);Muscle weakness (generalized) (M62.81);Other abnormalities of gait and mobility (R26.89);Pain;Other symptoms and signs involving cognitive function Pain - Right/Left: Right Pain - part of body: Hip;Leg;Ankle and joints of foot;Knee                Time: 1036-1101 OT Time Calculation (min): 25 min Charges:  OT General  Charges $OT Visit: 1 Visit OT Evaluation $OT Eval Moderate Complexity: [place], OT Acute Rehabilitation Services Pager: [phone] Office: [phone]   Hortencia Pilar 07/22/2019, 12:54 PM

## 2019-07-22 NOTE — TOC Initial Note (Signed)
Transition of Care [facility]) - Initial/Assessment Note    Patient Details  Name: Lacey Cook MRN: [id number] Date of Birth: [birth year]  Transition of Care [facility]) CM/SW Contact:    Lia Hopping, [place] Phone Number: 07/22/2019, 12:03 PM  Clinical Narrative:      Lacey Cook is a 64 y.o.femalewith medical history significant ofAlzheimer's dementia, depression who presents to the ED with bilateral knee pain associated with swelling, redness, and inability to walk             CSW reached out to the patient daughter to discuss rehab SNF placement. Patient lives in the home with her daughter, daughters boyfriend and minor grandchild. Daughter reports the patient has been falling in the home for about 2 weeks and has been crawling around instead of walking. Daughter reports the patient has ostearthritis of th right and cannot have surgery due to her dementia. Daughter believes the patient hip pain may be worsening. Daughter reports 2 weeks ago the patient used a rolling walker to ambulate. Daughter provides supervision while the patient bathes and helps the patient get dress. Daughter reports she does not want to put the patient in a nursing home permanently but is agreeable to send her to rehab. Daughter reports, "I promised her that I would always take care of her." Daughter reports she arranged her home to accommodate the patient needs, she sleeps in the living room with the patient to supervise her during the night.   CSW explain SNF rehab placement and medicaid 30 day requirement, daughter agreeable.  PASRR process started.     Expected Discharge Plan: Skilled Nursing Facility Barriers to Discharge: Continued Medical Work up   Patient Goals and CMS Choice   CMS Medicare.gov Compare Post Acute Care list provided to:: Other (Comment Required)(Daughter) Choice offered to / list presented to : Adult Children  Expected Discharge Plan and Services Expected Discharge Plan: [place] In-house Referral: Clinical Social Work   Post Acute Care Choice: [place] Living arrangements for the past 2 months: [place]                                      Prior Living Arrangements/Services Living arrangements for the past 2 months: Single Family Home Lives with:: Adult Children, Minor Children Patient language and need for interpreter reviewed:: No        Need for Family Participation in Patient Care: Yes (Comment) Care giver support system in place?: Yes (comment) Current home services: DME Criminal Activity/Legal Involvement Pertinent to Current Situation/Hospitalization: No - Comment as needed  Activities of Daily Living Home Assistive Devices/Equipment: Gilford Rile (specify type) ADL Screening (condition at time of admission) Patient's cognitive ability adequate to safely complete daily activities?: No Is the patient deaf or have difficulty hearing?: No Does the patient have difficulty seeing, even when wearing glasses/contacts?: No Does the patient have difficulty concentrating, remembering, or making decisions?: Yes Patient able to express need for assistance with ADLs?: No Does the patient have difficulty dressing or bathing?: Yes Independently performs ADLs?: No Does the patient have difficulty walking or climbing stairs?: Yes Weakness of Legs: Both Weakness of Arms/Hands: Both  Permission Sought/Granted      Share Information with NAME: Francom, ronda     Permission granted to share info w Relationship: Daughter  Permission granted to share info w Contact Information: [phone]  Emotional Assessment Appearance:: Appears stated  age Attitude/Demeanor/Rapport: Unable to Assess Affect (typically observed): Unable to Assess   Alcohol / Substance Use: Not Applicable Psych Involvement: No (comment)  Admission diagnosis:  Cellulitis of knee [id number] Left hip pain [M25.552] Patient Active Problem List   Diagnosis  Date Noted  . Cellulitis of knee 07/21/2019  . Effusion of right knee 07/21/2019  . Alzheimer's dementia ([place]) 07/21/2019  . Elevated LFTs 07/21/2019  . Encounter for medication review 12/17/2018  . Arthritis 11/30/2018  . Intertriginous dermatitis associated with moisture 11/30/2018  . Asthma 11/30/2018  . Depression, recurrent ([place]) 11/30/2018  . Adjustment disorder with disturbance of emotion 12/07/2017   PCP:  Richarda Osmond, DO Pharmacy:   [facility] [place], [place] [address] Phone: [phone] Fax: [phone]     Social Determinants of Health (SDOH) Interventions    Readmission Risk Interventions No flowsheet data found.

## 2019-07-22 NOTE — Evaluation (Signed)
Physical Therapy Evaluation Patient Details Name: Lacey Cook MRN: [id number] DOB: 20-Jul-1955 Today's Date: 07/22/2019   History of Present Illness   64 y.o. female with medical history significant of Alzheimer's dementia, depression who presents to the ED with bilateral knee pain associated with swelling, redness, and inability to walk.(Simultaneous filing. User may not have seen previous data.)  Clinical Impression  Pt admitted as above and presenting with functional mobility limitations 2* generalized weakness, balance deficits, dementia related cognitive deficits, elevated anxiety, and chronic R hip pain.  Pt is currently requiring significant assist of 2 for performance of all mobility tasks and would benefit from follow up rehab at SNF level.    Follow Up Recommendations SNF    Equipment Recommendations  None recommended by PT    Recommendations for Other Services       Precautions / Restrictions Precautions Precautions: Fall(Simultaneous filing. User may not have seen previous data.) Restrictions Weight Bearing Restrictions: No(Simultaneous filing. User may not have seen previous data.)      Mobility  Bed Mobility Overal bed mobility: Needs Assistance Bed Mobility: Supine to Sit;Sit to Supine     Supine to sit: Max assist;+2 for physical assistance;HOB elevated Sit to supine: Mod assist;Max assist;+2 for physical assistance   General bed mobility comments: Pt with posterior lean, actively resisting coming to EOB due to pain, premedicated. Once sitting EOB pt with fair sitting balance and calmer.   Transfers                 General transfer comment: not attempted this session. pt becoming upset with cues for standing  Ambulation/Gait                Stairs            Wheelchair Mobility    Modified Rankin (Stroke Patients Only)       Balance Overall balance assessment: Needs assistance Sitting-balance support: No upper extremity  supported;Feet supported Sitting balance-Leahy Scale: Fair Sitting balance - Comments: sat EOB and completed grooming tasks.                                     Pertinent Vitals/Pain Pain Assessment: Faces Faces Pain Scale: Hurts even more Pain Location: pt grimacing with R LE movement but unable to state where pain is Pain Descriptors / Indicators: Grimacing;Guarding;Moaning Pain Intervention(s): Monitored during session;Limited activity within patient's tolerance;Repositioned;Premedicated before session    Home Living Family/patient expects to be discharged to:: Skilled nursing facility(Simultaneous filing. User may not have seen previous data.) Living Arrangements: Children               Additional Comments: Per chart review, pt lives with her daughter. Unable to obtain home setup and PLOF data from pt.     Prior Function Level of Independence: Needs assistance   Gait / Transfers Assistance Needed: per chart review, pt walking with a rw at baseline  ADL's / Homemaking Assistance Needed: assist at least with UB/LB ADLs per clinical judgement. pt with advanced Alzheimer's. Able to comb hair and complete oral care with setup sitting EOB on eval.        Hand Dominance        Extremity/Trunk Assessment   Upper Extremity Assessment Upper Extremity Assessment: Generalized weakness;Difficult to assess due to impaired cognition    Lower Extremity Assessment Lower Extremity Assessment: Generalized weakness;RLE deficits/detail RLE Deficits / Details: pt expressed pain  with movement but unable to localize.   RLE: Unable to fully assess due to pain    Cervical / Trunk Assessment Cervical / Trunk Assessment: Kyphotic  Communication   Communication: No difficulties;Other (comment)(repetitive, minimal verbalizations "yes" "no" "I don't know.)  Cognition Arousal/Alertness: Awake/alert Behavior During Therapy: Flat affect Overall Cognitive Status: History of  cognitive impairments - at baseline                                        General Comments      Exercises     Assessment/Plan    PT Assessment Patient needs continued PT services  PT Problem List Decreased strength;Decreased range of motion;Decreased activity tolerance;Decreased balance;Decreased mobility;Decreased cognition;Decreased knowledge of use of DME;Obesity;Pain       PT Treatment Interventions DME instruction;Gait training;Functional mobility training;Therapeutic activities;Therapeutic exercise;Balance training;Patient/family education    PT Goals (Current goals can be found in the Care Plan section)  Acute Rehab PT Goals Patient Stated Goal: No goals expressed PT Goal Formulation: With patient Time For Goal Achievement: 08/05/19 Potential to Achieve Goals: Fair    Frequency Min 2X/week   Barriers to discharge        Co-evaluation PT/OT/SLP Co-Evaluation/Treatment: Yes Reason for Co-Treatment: Necessary to address cognition/behavior during functional activity;For patient/therapist safety;To address functional/ADL transfers PT goals addressed during session: Mobility/safety with mobility OT goals addressed during session: ADL's and self-care       AM-PAC PT "6 Clicks" Mobility  Outcome Measure Help needed turning from your back to your side while in a flat bed without using bedrails?: A Lot Help needed moving from lying on your back to sitting on the side of a flat bed without using bedrails?: A Lot Help needed moving to and from a bed to a chair (including a wheelchair)?: Total Help needed standing up from a chair using your arms (e.g., wheelchair or bedside chair)?: Total Help needed to walk in hospital room?: Total Help needed climbing 3-5 steps with a railing? : Total 6 Click Score: 8    End of Session   Activity Tolerance: Other (comment);Patient tolerated treatment well(ltd by anxiety) Patient left: in bed;with call bell/phone  within reach;with bed alarm set Nurse Communication: Mobility status PT Visit Diagnosis: History of falling (Z91.81);Difficulty in walking, not elsewhere classified (R26.2)    Time: 1035-1100 PT Time Calculation (min) (ACUTE ONLY): 25 min   Charges:   PT Evaluation $PT Eval Moderate Complexity: [place] Pager [phone] Office [phone]   Sally-Anne Wamble 07/22/2019, 1:03 PM

## 2019-07-22 NOTE — Progress Notes (Addendum)
Transition of Care (TOC) -Dementia Note     Patient Details  Name: Lacey Cook  [id number] Date of Birth:January 24, 1956   Transition of Care [facility]) CM/SW Contact  Name:Legna Mausolf Vernona Rieger Phone Number: [phone] Date:07/22/2019 Time:   MUST ID:    To Whom it May Concern:   Please be advised that the above patient has a primary diagnosis of dementia which supersedes any psychiatric diagnosis.

## 2019-07-22 NOTE — Progress Notes (Signed)
Initial Nutrition Assessment  RD working remotely.   DOCUMENTATION CODES:   Not applicable  INTERVENTION:  - will order Ensure Enlive BID, each supplement provides 350 kcal and 20 grams of protein. - will order Magic Cup with lunch meals, each supplement provides 290 kcal and 9 grams of protein. - will order daily multivitamin with minerals. - floor staff to provide feeding assistance, if needed.  * weigh patient today.    NUTRITION DIAGNOSIS:   Increased nutrient needs related to acute illness as evidenced by estimated needs.  GOAL:   Patient will meet greater than or equal to 90% of their needs  MONITOR:   PO intake, Supplement acceptance, Labs, Weight trends  REASON FOR ASSESSMENT:   Malnutrition Screening Tool  ASSESSMENT:   64 y.o. female with medical history of Alzheimer's dementia, severe osteoarthritis (especially in the R hip), and depression. She presented to the ED with bilateral knee pain associated with swelling, redness, and inability to walk. Patient was accompanied by her daughter, who provides most of the information. The patient fell 1 week PTA and has since been crawling on her knees.  At baseline she can ambulate with the use of a walker but has not been unable to over the past 1 week. She has not been eating well over the past 1 day.  Per flow sheet documentation, patient is a/o to self only and consumed 0% of breakfast this AM. She has not been seen by a [place] RD at any time in the past. Per chart review, she has not been weighed this admission and last recorded weight was on 11/24/18 when she weighed 194 lb.  Per notes: - bilateral knee cellulitis with bilateral knee wounds--significant eschar due to crawling on knees for mobilization x1 week - chronic R hip pain 2/2 osteoarthritis - pending CT L hip - PT and OT and likely need for SNF at d/c - advanced Alzheimer's dementia   Labs reviewed; K: 3.4 mmol/l, Ca: 8.1 mg/dl, AST  elevated. Medications reviewed; 10 mg rectal dulcolax x1 dose 2/25, 3 mg melatonin/day, 40 mEq Klor-Con x1 dose 2/25.  IVF; NS @ 100 ml/hr.    NUTRITION - FOCUSED PHYSICAL EXAM:  unable to complete at this time.   Diet Order:   Diet Order            Diet regular Room service appropriate? Yes; Fluid consistency: Thin  Diet effective now              EDUCATION NEEDS:   No education needs have been identified at this time  Skin:  Skin Assessment: Reviewed RN Assessment  Last BM:  PTA/unknown  Height:   Ht Readings from Last 1 Encounters:  12/06/17 5\' 2"  (1.575 m)    Weight:   Wt Readings from Last 1 Encounters:  11/24/18 88.3 kg    Ideal Body Weight:  50 kg  BMI:  There is no height or weight on file to calculate BMI.  Estimated Nutritional Needs:   Kcal:  1760-1900 kcal  Protein:  75-90 grams  Fluid:  >/= 1.8 L/day     Jarome Matin, MS, RD, LDN, CNSC Inpatient Clinical Dietitian RD pager # available in [facility]  After hours/weekend pager # available in [facility]

## 2019-07-22 NOTE — Progress Notes (Signed)
PROGRESS NOTE    Lacey Cook  MOQ:[id number] DOB: Sep 24, 1955 DOA: 07/21/2019 PCP: Richarda Osmond, DO    Brief Narrative:  Lacey Cook is a 64 y.o. female with medical history significant of Alzheimer's dementia, depression who presents to the ED with bilateral knee pain associated with swelling, redness, and inability to walk.  Patient is accompanied by her daughter, who gives the majority of their HPI due to her underlying dementia.  Apparently patient fell 1 week ago, and has since been crawling on her knees.  At baseline she is able to ambulate with the use of a walker, but has not been unable to over the past 1 week.  Also, patient has not been eating well over the past day.  She has severe osteoarthritis, especially of the right hip.  Per daughter's report, she is been told that she is not an operative candidate due to her underlying Alzheimer's dementia.  ED Course: Temperature 98.2, HR 108, RR 17, BP 120/73, SPO2 100% on room air.  The BC count 8.1, hemoglobin 13.6, platelet count 210.  Sodium 140, potassium 3.5, chloride 104, CO2 28, BUN 17, creatinine 0.58.  Glucose 123.  Albumin 2.9.  AST 75, ALT 25, total bili 1.4.  CT head with no acute intracranial abnormalities but did note progressive asymmetric atrophy of the left frontal and temporal lobe.  Right hip x-ray with no acute fracture/dislocation with severe degenerative changes of the right hip.  Mild osteoarthritis left hip.  Left knee x-ray with no acute fracture/dislocation, advanced degenerative changes left knee.  Right knee x-ray with no acute displaced fracture/dislocation, small/moderate sized joint effusion, advanced degenerative changes.  Urinalysis and urine culture: Pending.  ED referred for admission given cellulitis and inability to walk.   Assessment & Plan:   Principal Problem:   Cellulitis of knee Active Problems:   Arthritis   Depression, recurrent (HCC)   Effusion of right knee   Alzheimer's dementia  (HCC)   Elevated LFTs   Bilateral knee cellulitis Bilateral knee wounds, present on admission Patient presenting from home, following fall 1 week ago.  Since has been crawling on her knees for mobility.  She has now developed significant eschar formation with surrounding cellulitic changes to bilateral knees.  Also noted right heel blister formation.  Patient is afebrile, without leukocytosis. --Continue ceftriaxone 1 g IV every 24 hours --Wound care following --Follow CBC and fever curve closely  Elevated LFTs: improving AST 75, ALT 25, total bilirubin 1.4.  No abdominal symptoms.  --AST 75-->58 --ALT 25-->25 --Tbili 1.4-->1.2 --acute hepatitis panel pending --Trend CMP daily  Chronic right hip pain secondary to advanced osteoarthritis Moderate right knee effusion Weakness, gait disturbance Daughter reports that usually at baseline able to ambulate with the use of a walker, fell 1 week ago and since has been crawling on the knees and unable to ambulate appropriately.  Has severe osteoarthritis, has been told by her PCP apparently that she is not a candidate for any operative management due to her underlying advanced Alzheimer's dementia.  X-rays bilateral hips, and knees with no acute fracture or dislocation, just notable for moderate to severe osteoarthritis with right knee effusion.  Currently patient lives with her daughter, given patient's daughter on medical issues, currently unable to care for patient appropriately at home given her higher needs for ADLs.  Left hip CT with no fracture/AVN but with moderate subcutaneous inflammation, edema, fluid versus hematoma. --Orthopedics consulted, discussed with Dr. Griffin Lacey --PT/OT consultation pending --Social work consult for discharge  planning, anticipate possible SNF need  Advanced Alzheimer's dementia --Supportive care, not on any home medications. --Melatonin 3 mg p.o. nightly --Dementia precautions  Depression: Continue Effexor  75 mg p.o. twice daily   DVT prophylaxis: Lovenox Code Status: Full code Family Communication: Updated daughter Disposition Plan:      Patient is from: Home with daughter     Anticipated Disposition:  To be determined     Barriers to discharge or conditions that needs to be met prior to discharge: Orthopedic consultation, pending PT/OT evaluation  Consultants:   Orthopedics - Dr. Griffin Lacey  Procedures:   none  Antimicrobials:   Ceftriaxone   Subjective: Patient seen and examined at bedside, resting comfortably.  Pleasantly confused.  Continues with significant bilateral knee pain, worse on left with inability flex/extend without significant discomfort.  Surrounding erythema bilateral knees improved since yesterday.  Left knee notable for some exudative material.  No family present this morning.  Unable to obtain any further ROS given her underlying dementia which is at baseline.  No acute events overnight per nursing staff.  Objective: Vitals:   07/21/19 1709 07/21/19 1944 07/21/19 2131 07/22/19 0532  BP: 131/79 119/73 94/64 109/65  Pulse: (!) 104 99 89 73  Resp: 16 18 20 17   Temp:  98.5 F (36.9 C)  98.4 F (36.9 C)  TempSrc:  Oral  Oral  SpO2: 99% 100% 95% 97%    Intake/Output Summary (Last 24 hours) at 07/22/2019 1011 Last data filed at 07/22/2019 0848 Gross per 24 hour  Intake 120 ml  Output 0 ml  Net 120 ml   There were no vitals filed for this visit.  Examination:  General exam: Appears calm and comfortable, pleasantly confused Respiratory system: Clear to auscultation. Respiratory effort normal. Cardiovascular system: S1 & S2 heard, RRR. No JVD, murmurs, rubs, gallops or clicks. No pedal edema. Gastrointestinal system: Abdomen is nondistended, soft and nontender. No organomegaly or masses felt. Normal bowel sounds heard. Central nervous system: Alert, not oriented to person/place/time/situation. No focal neurological deficits. Extremities: Left knee with  tenderness to palpation anterior/medial/lateral aspects with significant pain on passive range of motion, will not attempt active due to pain.  Mild pain with passive range of motion right knee, will perform active range of motion without much issue. Skin: Right knee with dry ash are without exudate.  Left knee with eschar noted with evidence of exudate in both with surrounding erythema.  Blister noted right heel Psychiatry: Judgement and insight appear normal. Mood & affect appropriate.             Data Reviewed: I have personally reviewed following labs and imaging studies  CBC: Recent Labs  Lab 07/21/19 1432 07/22/19 0316  WBC 8.1 6.2  HGB 13.6 12.5  HCT 40.3 38.1  MCV 95.7 98.4  PLT 210 638   Basic Metabolic Panel: Recent Labs  Lab 07/21/19 1425 07/22/19 0316  NA 140 139  K 3.5 3.4*  CL 104 105  CO2 28 24  GLUCOSE 123* 113*  BUN 17 19  CREATININE 0.58 0.52  CALCIUM 8.3* 8.1*  MG  --  2.1   GFR: CrCl cannot be calculated (Unknown ideal weight.). Liver Function Tests: Recent Labs  Lab 07/21/19 1425 07/22/19 0316  AST 75* 58*  ALT 25 25  ALKPHOS 57 54  BILITOT 1.4* 1.2  PROT 6.3* 6.0*  ALBUMIN 2.9* 2.7*   No results for input(s): LIPASE, AMYLASE in the last 168 hours. No results for input(s): AMMONIA in the  last 168 hours. Coagulation Profile: No results for input(s): INR, PROTIME in the last 168 hours. Cardiac Enzymes: Recent Labs  Lab 07/22/19 0316  CKTOTAL 760*   BNP (last 3 results) No results for input(s): PROBNP in the last 8760 hours. HbA1C: No results for input(s): HGBA1C in the last 72 hours. CBG: No results for input(s): GLUCAP in the last 168 hours. Lipid Profile: No results for input(s): CHOL, HDL, LDLCALC, TRIG, CHOLHDL, LDLDIRECT in the last 72 hours. Thyroid Function Tests: Recent Labs    07/21/19 1958  TSH 2.995   Anemia Panel: No results for input(s): VITAMINB12, FOLATE, FERRITIN, TIBC, IRON, RETICCTPCT in the last 72  hours. Sepsis Labs: No results for input(s): PROCALCITON, LATICACIDVEN in the last 168 hours.  Recent Results (from the past 240 hour(s))  SARS CORONAVIRUS 2 (TAT 6-24 HRS) Nasopharyngeal Nasopharyngeal Swab     Status: None   Collection Time: 07/21/19  5:10 PM   Specimen: Nasopharyngeal Swab  Result Value Ref Range Status   SARS Coronavirus 2 NEGATIVE NEGATIVE Final    Comment: (NOTE) SARS-CoV-2 target nucleic acids are NOT DETECTED. The SARS-CoV-2 RNA is generally detectable in upper and lower respiratory specimens during the acute phase of infection. Negative results do not preclude SARS-CoV-2 infection, do not rule out co-infections with other pathogens, and should not be used as the sole basis for treatment or other patient management decisions. Negative results must be combined with clinical observations, patient history, and epidemiological information. The expected result is Negative. Fact Sheet for Patients: SugarRoll.be Fact Sheet for Healthcare Providers: https://www.woods-mathews.com/ This test is not yet approved or cleared by the [place] FDA and  has been authorized for detection and/or diagnosis of SARS-CoV-2 by FDA under an Emergency Use Authorization (EUA). This EUA will remain  in effect (meaning this test can be used) for the duration of the COVID-19 declaration under Section 56 4(b)(1) of the Act, 21 U.S.C. section 360bbb-3(b)(1), unless the authorization is terminated or revoked sooner. Performed at [facility], [place] [address] [place], [place] [address]          Radiology Studies: CT Head Wo Contrast  Result Date: 07/21/2019 CLINICAL DATA:  Head trauma. Altered mental status. Failure to thrive. EXAM: CT HEAD WITHOUT CONTRAST TECHNIQUE: Contiguous axial images were obtained from the base of the skull through the vertex without intravenous contrast. COMPARISON:  04/25/2017 FINDINGS: Brain: There is  progressive prominent volume loss in the left frontal lobe with milder volume loss in the right frontal lobe. Asymmetric volume loss is also present in the left temporal lobe. Hypodensities in the cerebral white matter bilaterally are similar to the prior study and nonspecific but compatible with mild chronic small vessel ischemic disease. There is no evidence of acute infarct, intracranial hemorrhage, mass, midline shift, or extra-axial fluid collection. Vascular: Calcified atherosclerosis at the skull base. No hyperdense vessel. Skull: No fracture or suspicious osseous lesion. Sinuses/Orbits: Mild mucosal thickening in the maxillary sinuses. Clear mastoid air cells. Unremarkable orbits. Other: None. IMPRESSION: 1. No evidence of acute intracranial abnormality. 2. Progressive asymmetric atrophy involving the left frontal and temporal lobes. Electronically Signed   By: Logan Bores M.D.   On: 07/21/2019 16:06   CT Hip Left Wo Contrast  Result Date: 07/21/2019 CLINICAL DATA:  Poly trauma. Unable to walk. Left hip pain. EXAM: CT OF THE LEFT HIP WITHOUT CONTRAST TECHNIQUE: Multidetector CT imaging of the left hip was performed according to the standard protocol. Multiplanar CT image reconstructions were also  generated. COMPARISON:  Radiographs 07/21/2019 FINDINGS: The left hip is normally located. No hip fracture or AVN. The acetabulum is intact. No left-sided pelvic fractures are identified. The pubic symphysis and visualized left SI joint are intact. Moderate subcutaneous inflammation/edema/fluid or hematoma likely representing a direct contusion. No obvious intramuscular hematoma to suggest a significant muscle tear. Large amount of stool in the distended rectum. IMPRESSION: 1. No left hip fracture or AVN. 2. Moderate subcutaneous inflammation/edema/fluid or hematoma likely representing a direct contusion. No obvious intramuscular hematoma to suggest a significant muscle tear. 3. Large amount of stool in the  distended rectum. Electronically Signed   By: Marijo Sanes M.D.   On: 07/21/2019 17:03   DG Knee Complete 4 Views Left  Result Date: 07/21/2019 CLINICAL DATA:  Pain. EXAM: LEFT KNEE - COMPLETE 4+ VIEW COMPARISON:  None. FINDINGS: There are advanced degenerative changes of the left knee, greatest within the medial and patellofemoral compartments. There is no acute displaced fracture. No dislocation. There is a small suprapatellar joint effusion. IMPRESSION: 1. No acute displaced fracture or dislocation. 2. Advanced degenerative changes of the left knee. Electronically Signed   By: Constance Holster M.D.   On: 07/21/2019 16:07   DG Knee Complete 4 Views Right  Result Date: 07/21/2019 CLINICAL DATA:  Pain EXAM: RIGHT KNEE - COMPLETE 4+ VIEW COMPARISON:  None. FINDINGS: There are advanced degenerative changes of the right knee, greatest within the medial and patellofemoral compartments. There is no acute displaced fracture. No dislocation. There is a small to moderate-sized joint effusion. IMPRESSION: 1. No acute displaced fracture or dislocation. 2. Small to moderate-sized joint effusion. 3. Advanced degenerative changes. Electronically Signed   By: Constance Holster M.D.   On: 07/21/2019 16:10   DG Hip Unilat With Pelvis 2-3 Views Left  Result Date: 07/21/2019 CLINICAL DATA:  Pain status post fall EXAM: DG HIP (WITH OR WITHOUT PELVIS) 2-3V LEFT COMPARISON:  None. FINDINGS: There are end-stage degenerative changes of the right hip. There is mild-to-moderate osteoarthritis of the left hip. No acute displaced fracture or dislocation. There is a moderate to large amount of stool at the level of the rectum. IMPRESSION: 1. No acute displaced fracture or dislocation. 2. Severe degenerative changes of the right hip. 3. Mild-to-moderate osteoarthritis of the left hip. Electronically Signed   By: Constance Holster M.D.   On: 07/21/2019 16:07        Scheduled Meds: . bisacodyl  10 mg Rectal Once  .  enoxaparin (LOVENOX) injection  40 mg Subcutaneous QHS  . feeding supplement (ENSURE ENLIVE)  237 mL Oral BID BM  . Melatonin  3 mg Oral QHS  . multivitamin with minerals  1 tablet Oral Daily  . venlafaxine  75 mg Oral BID   Continuous Infusions: . sodium chloride 100 mL/hr at 07/22/19 0824  . cefTRIAXone (ROCEPHIN)  IV 1 g (07/22/19 0947)     LOS: 1 day    Time spent: 37 minutes spent on chart review, discussion with nursing staff, consultants, updating family and interview/physical exam; more than 50% of that time was spent in counseling and/or coordination of care.    Korry Dalgleish J [place] DO [facility] Available via Epic secure chat 7am-7pm After these hours, please refer to coverage provider listed on [email or web address] 07/22/2019, 10:11 AM

## 2019-07-22 NOTE — Consult Note (Signed)
WOC Nurse Consult Note: Reason for Consult: Right heel serum-filled intact Blister (Stage 2), Right knee with dry stable eschar, left knee with necrotic wound with soft center, surrounding tissue involvement. Photos provided by admitting MD in EMR. Wound type: Trauma, pressure Pressure Injury POA: Yes Measurement: Right knee: 4.5cm x 4cm with depth unable to be determined due to the presence of dry stable eschar.  No exudate. Left knee:  7cm x 5cm with depth unable to be determined due to the presence of soft nonviable tissue. Evicdence of exudate in that there is dried serum at 3 o'clock. No active exudate. Periwound erythema to 1cm. Right heel blister:  3cm x 2.5cm oval intact, serum filled blister (Stage 2). Wound bed:As described above Drainage (amount, consistency, odor) As described above Periwound: As described above Dressing procedure/placement/frequency: I have provided guidance for care of this patient via the Orders to include:  Prevalon Boots for pressure redistribution, a mattress replacement for management of microclimate and pressure redistribution, topical care to the right heel and left knee with xeroform antimicrobial gauze topped with dry dressing.  The right knee stable eschar will be painted twice daily with betadine swabstick and allowed to air dry.  Recommend orthopedic consultation regarding need for further workup and possible debridement of right knee wound. My concern is due to the lack of subcutaneous tissue over the patella and the increased potential for osteomyelitis.  If you agree, please order/arrange.  If more aggressive debridement is desired at the site of the right knee (or bilateral knees), an alternative might be to employ an enzymatic debriding agent, i.e., collagenase (Santyl). I defer to Ortho in determining if this is more advantageous than a conservative approach with systemic antibiotics.  I have communicated my findings and recommendations to Dr.  [place] via [facility].  [facility] nursing team will not follow, but will remain available to this patient, the nursing and medical teams.  Please re-consult if needed. Thanks, Maudie Flakes, MSN, RN, [place], Arther Abbott  Pager# [phone]

## 2019-07-22 NOTE — Consult Note (Signed)
ORTHOPAEDIC CONSULTATION  REQUESTING PHYSICIAN: [place] Eric J, DO  Chief Complaint: lower leg pain  HPI: Lacey Cook is a 64 y.o. female with a past history significant for dementia, depression, hypertension, chronic right hip pain, and diabetes.   Patient was brought into [facility] by private vehicle on 07/21/2019. Per ED note, Patient's daughter was her primary historian at that time and noted that the patient had a fall that occurred one week ago was unwitnessed. It was only discovered after patient's granddaughter found her crawling around on the floor for a few days. Prior the the fall, she was able to ambulate independently to the bathroom. Since the fall, she is unable to stand, even with assistance, due to hip pain. Additionally, she sustained fairly significant wounds to her bilateral knees from crawling around on the floor. She also has a wound on her heel from crossing her legs which seems to somewhat improve the hip pain. The daughter reports to have been cleaning the wounds with hydrogen peroxide however have progressed over the past few days and began draining. No known fevers. The daughter does not believe that her mental status has changed since the fall."  Patient was admitted for bilateral knee cellulitis and inability to ambulate. 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 Bilateral knee x-rays demonstrated advanced osteoarthritis. She has been started on IV antibiotics and wound care is following. Orthopedics was consulted.  Due to her advanced dementia, unable to obtain an adequate history. She only reports, she "just doesn't feel good." No family present at the bedside.   Past Medical History:  Diagnosis Date  . Chronic right hip pain   . Diabetes mellitus without complication ([place])   . Hypertension   . Renal disorder   . Seizures ([place])   . Stroke [facility])    History reviewed. No pertinent surgical history. Social History     Socioeconomic History  . Marital status: Widowed    Spouse name: Not on file  . Number of children: Not on file  . Years of education: Not on file  . Highest education level: Not on file  Occupational History  . Not on file  Tobacco Use  . Smoking status: Never Smoker  . Smokeless tobacco: Never Used  Substance and Sexual Activity  . Alcohol use: No  . Drug use: Never  . Sexual activity: Yes    Birth control/protection: Condom  Other Topics Concern  . Not on file  Social History Narrative  . Not on file   Social Determinants of Health   Financial Resource Strain:   . Difficulty of Paying Living Expenses: Not on file  Food Insecurity:   . Worried About Charity fundraiser in the Last Year: Not on file  . Ran Out of Food in the Last Year: Not on file  Transportation Needs:   . Lack of Transportation (Medical): Not on file  . Lack of Transportation (Non-Medical): Not on file  Physical Activity:   . Days of Exercise per Week: Not on file  . Minutes of Exercise per Session: Not on file  Stress:   . Feeling of Stress : Not on file  Social Connections:   . Frequency of Communication with Friends and Family: Not on file  . Frequency of Social Gatherings with Friends and Family: Not on file  . Attends Religious Services: Not on file  . Active Member of Clubs or Organizations: Not on file  . Attends Club or  Organization Meetings: Not on file  . Marital Status: Not on file   No family history on file. Allergies  Allergen Reactions  . Shellfish Allergy Anaphylaxis    Throat swells  . Sulfa Antibiotics Anaphylaxis    Throat Swelling   Prior to Admission medications   Medication Sig Start Date End Date Taking? Authorizing Provider  acetaminophen (TYLENOL) 500 MG tablet Take 1,000 mg by mouth every 6 (six) hours as needed for moderate pain.   Yes [provider]  albuterol (VENTOLIN HFA) 108 (90 Base) MCG/ACT inhaler Inhale 2 puffs into the lungs every 6 (six)  hours as needed for wheezing or shortness of breath. 11/24/18  Yes Anderson, Chelsey L, DO  venlafaxine (EFFEXOR) 75 MG tablet Take 1 tablet (75 mg total) by mouth 2 (two) times daily. 12/14/18  Yes Anderson, Chelsey L, DO  fluticasone (FLONASE) 50 MCG/ACT nasal spray Place 1 spray into both nostrils daily. Patient not taking: Reported on 12/16/2018 07/26/17   Langston Masker B, PA-C  nystatin cream (MYCOSTATIN) Apply 1 application topically 2 (two) times daily. Patient not taking: Reported on 07/21/2019 11/24/18   Richarda Osmond, DO   CT Head Wo Contrast  Result Date: 07/21/2019 CLINICAL DATA:  Head trauma. Altered mental status. Failure to thrive. EXAM: CT HEAD WITHOUT CONTRAST TECHNIQUE: Contiguous axial images were obtained from the base of the skull through the vertex without intravenous contrast. COMPARISON:  04/25/2017 FINDINGS: Brain: There is progressive prominent volume loss in the left frontal lobe with milder volume loss in the right frontal lobe. Asymmetric volume loss is also present in the left temporal lobe. Hypodensities in the cerebral white matter bilaterally are similar to the prior study and nonspecific but compatible with mild chronic small vessel ischemic disease. There is no evidence of acute infarct, intracranial hemorrhage, mass, midline shift, or extra-axial fluid collection. Vascular: Calcified atherosclerosis at the skull base. No hyperdense vessel. Skull: No fracture or suspicious osseous lesion. Sinuses/Orbits: Mild mucosal thickening in the maxillary sinuses. Clear mastoid air cells. Unremarkable orbits. Other: None. IMPRESSION: 1. No evidence of acute intracranial abnormality. 2. Progressive asymmetric atrophy involving the left frontal and temporal lobes. Electronically Signed   By: Logan Bores M.D.   On: 07/21/2019 16:06   CT Hip Left Wo Contrast  Result Date: 07/21/2019 CLINICAL DATA:  Poly trauma. Unable to walk. Left hip pain. EXAM: CT OF THE LEFT HIP WITHOUT CONTRAST  TECHNIQUE: Multidetector CT imaging of the left hip was performed according to the standard protocol. Multiplanar CT image reconstructions were also generated. COMPARISON:  Radiographs 07/21/2019 FINDINGS: The left hip is normally located. No hip fracture or AVN. The acetabulum is intact. No left-sided pelvic fractures are identified. The pubic symphysis and visualized left SI joint are intact. Moderate subcutaneous inflammation/edema/fluid or hematoma likely representing a direct contusion. No obvious intramuscular hematoma to suggest a significant muscle tear. Large amount of stool in the distended rectum. IMPRESSION: 1. No left hip fracture or AVN. 2. Moderate subcutaneous inflammation/edema/fluid or hematoma likely representing a direct contusion. No obvious intramuscular hematoma to suggest a significant muscle tear. 3. Large amount of stool in the distended rectum. Electronically Signed   By: Marijo Sanes M.D.   On: 07/21/2019 17:03   DG Knee Complete 4 Views Left  Result Date: 07/21/2019 CLINICAL DATA:  Pain. EXAM: LEFT KNEE - COMPLETE 4+ VIEW COMPARISON:  None. FINDINGS: There are advanced degenerative changes of the left knee, greatest within the medial and patellofemoral compartments. There is no acute  displaced fracture. No dislocation. There is a small suprapatellar joint effusion. IMPRESSION: 1. No acute displaced fracture or dislocation. 2. Advanced degenerative changes of the left knee. Electronically Signed   By: Constance Holster M.D.   On: 07/21/2019 16:07   DG Knee Complete 4 Views Right  Result Date: 07/21/2019 CLINICAL DATA:  Pain EXAM: RIGHT KNEE - COMPLETE 4+ VIEW COMPARISON:  None. FINDINGS: There are advanced degenerative changes of the right knee, greatest within the medial and patellofemoral compartments. There is no acute displaced fracture. No dislocation. There is a small to moderate-sized joint effusion. IMPRESSION: 1. No acute displaced fracture or dislocation. 2. Small to  moderate-sized joint effusion. 3. Advanced degenerative changes. Electronically Signed   By: Constance Holster M.D.   On: 07/21/2019 16:10   DG Hip Unilat With Pelvis 2-3 Views Left  Result Date: 07/21/2019 CLINICAL DATA:  Pain status post fall EXAM: DG HIP (WITH OR WITHOUT PELVIS) 2-3V LEFT COMPARISON:  None. FINDINGS: There are end-stage degenerative changes of the right hip. There is mild-to-moderate osteoarthritis of the left hip. No acute displaced fracture or dislocation. There is a moderate to large amount of stool at the level of the rectum. IMPRESSION: 1. No acute displaced fracture or dislocation. 2. Severe degenerative changes of the right hip. 3. Mild-to-moderate osteoarthritis of the left hip. Electronically Signed   By: Constance Holster M.D.   On: 07/21/2019 16:07   Family History Reviewed and non-contributory, no pertinent history of problems with bleeding or anesthesia      Review of Systems Cannot obtain due to patient's advanced dementia   OBJECTIVE  Vitals: Patient Vitals for the past 8 hrs:  Weight  07/22/19 1000 84.2 kg   Examination: General: confused, no acute distress Cardiovascular: Warm extremities noted Respiratory: No cyanosis, no use of accessory musculature GI: No organomegaly, abdomen is soft and non-tender Skin: See below Neurologic: Unable to assess due to patient's advanced dementia. She is alert. Not oriented to person/place/time/situation. Endorses sensation distally. Psychiatric: Confused Lymphatic: No swelling obvious and reported other than the area involved in the exam below   Musculoskeletal: Bilateral upper extremities: patient can actively move upper extremities without pain.  RLE: Exam limited due to patient's cooperation. She reports tenderness about her right hip. Large eschar present on right knee with surrounding erythema (see previous images in media). No fluctuance appreciated. Difficult to assess patient's pain as she cannot  provide consistent responses and cannot tell me where she is hurting. She did report tenderness to palpation about the medial joint line. ROM limited due to patient's pain and cooperation. She tenses up and does not allow me to attempt any passive motion. She actively moves her right ankle without pain. Prevalon boots in place.  LLE: Exam limited due to patient's cooperation. She does not report tenderness about her left hip. Large eschar with exudative material present on left knee with surrounding erythema (see previous images in media). No fluctuance appreciated. Difficult to assess patient's pain as she cannot provide consistent responses and cannot tell me where she is hurting. She reports tenderness to palpation about her laterally knee. ROM limited due to patient's pain and cooperation. She tenses up and does not allow me to attempt any passive motion. She actively moves her left ankle without pain. Prevalon boots in place.   Test Results Imaging Per above  Labs cbc Recent Labs    07/21/19 1432 07/22/19 0316  WBC 8.1 6.2  HGB 13.6 12.5  HCT 40.3 38.1  PLT  210 173    Labs inflam No results for input(s): CRP in the last 72 hours.  Invalid input(s): ESR  Labs coag No results for input(s): INR, PTT in the last 72 hours.  Invalid input(s): PT  Recent Labs    07/21/19 1425 07/22/19 0316  NA 140 139  K 3.5 3.4*  CL 104 105  CO2 28 24  GLUCOSE 123* 113*  BUN 17 19  CREATININE 0.58 0.52  CALCIUM 8.3* 8.1*     ASSESSMENT AND PLAN: 64 y.o. female with the following: bilateral knee wounds and cellulitis, chronic advanced arthritis of bilateral hips and knees, weakness and inability to bear weight.   Orthopedics recommends admission to a medical service and we will provide consultation and follow along.  Exam limited to patient's cooperation and pain. She has eschars and surrounding cellulitis of her bilateral knees. I do not believe septic arthritis. Per therapy, she was  able to sit at the side of her bed and actively flex her knee slightly. Per medicine report, surrounding erythema has improved since yesterday. Patient does not have appreciable fluctuance/abscess. She is afebrile and WBC is within normal limits.   - Recommendations: continue IV antibiotics and local conservative wound care as per [place] consult note.   - Weight Bearing Status/Activity: WBAT -VTE Prophylaxis: per medicine - Pain control: per medicine - Orthopedics will continue to follow.   Noemi Chapel, PA-C 07/22/2019

## 2019-07-22 NOTE — NC FL2 (Signed)
[place] MEDICAID FL2 LEVEL OF CARE SCREENING TOOL     IDENTIFICATION  Patient Name: Lacey Cook Birthdate: Jul 14, 1955 Sex: female Admission Date (Current Location): 07/21/2019  [facility] [place] Number:  Herbalist and Address:  [facility],  [place], [place]      Provider Number: [id number]  Attending Physician Name and Address:  [place] Eric J, DO  Relative Name and Phone Number:     lucrecia, abelar Daughter   [phone]       Current Level of Care: Hospital Recommended Level of Care: [place] Prior Approval Number:    Date Approved/Denied:   PASRR Number:    Discharge Plan: SNF    Current Diagnoses: Patient Active Problem List   Diagnosis Date Noted  . Cellulitis of knee 07/21/2019  . Effusion of right knee 07/21/2019  . Alzheimer's dementia ([place]) 07/21/2019  . Elevated LFTs 07/21/2019  . Encounter for medication review 12/17/2018  . Arthritis 11/30/2018  . Intertriginous dermatitis associated with moisture 11/30/2018  . Asthma 11/30/2018  . Depression, recurrent ([place]) 11/30/2018  . Adjustment disorder with disturbance of emotion 12/07/2017    Orientation RESPIRATION BLADDER Height & Weight     Self(Alzheimer's dementia)  Normal Incontinent Weight: 185 lb 10 oz (84.2 kg) Height:     BEHAVIORAL SYMPTOMS/MOOD NEUROLOGICAL BOWEL NUTRITION STATUS      Incontinent Diet  AMBULATORY STATUS COMMUNICATION OF NEEDS Skin   Extensive Assist Verbally Skin abrasions, Bruising                       Personal Care Assistance Level of Assistance  Bathing, Feeding, Dressing Bathing Assistance: Maximum assistance Feeding assistance: Limited assistance Dressing Assistance: Maximum assistance     Functional Limitations Info  Sight, Hearing, Speech Sight Info: Adequate Hearing Info: Adequate Speech Info: Adequate    SPECIAL CARE FACTORS FREQUENCY  PT (By licensed PT), OT (By licensed OT)      PT Frequency: 5x/week OT Frequency: 5x/week            Contractures Contractures Info: Not present    Additional Factors Info  Code Status, Allergies, Insulin Sliding Scale, Psychotropic Code Status Info: Fullcode Allergies Info: Allergies: Shellfish Allergy, Sulfa Antibiotics Psychotropic Info: Effexor         Current Medications (07/22/2019):  This is the current hospital active medication list Current Facility-Administered Medications  Medication Dose Route Frequency Provider Last Rate Last Admin  . 0.9 %  sodium chloride infusion   Intravenous Continuous [place] Eric J, DO 100 mL/hr at 07/22/19 [id number] New Bag at 07/22/19 0824  . acetaminophen (TYLENOL) tablet 650 mg  650 mg Oral Q6H PRN [place] Donnamarie Poag, DO       Or  . acetaminophen (TYLENOL) suppository 650 mg  650 mg Rectal Q6H PRN [place] Eric J, DO      . albuterol (PROVENTIL) (2.5 MG/3ML) 0.083% nebulizer solution 2.5 mg  2.5 mg Inhalation Q6H PRN [place] Eric J, DO      . bisacodyl (DULCOLAX) suppository 10 mg  10 mg Rectal Once [place] Eric J, DO      . cefTRIAXone (ROCEPHIN) 1 g in sodium chloride 0.9 % 100 mL IVPB  1 g Intravenous Daily [place] Donnamarie Poag, DO 200 mL/hr at 07/22/19 0947 1 g at 07/22/19 0947  . enoxaparin (LOVENOX) injection 40 mg  40 mg Subcutaneous QHS [place] Eric J, DO   40 mg at 07/21/19 2329  . feeding supplement (  ENSURE ENLIVE) (ENSURE ENLIVE) liquid 237 mL  237 mL Oral BID BM [place] Eric J, DO   237 mL at 07/22/19 1114  . HYDROcodone-acetaminophen (NORCO/VICODIN) 5-325 MG per tablet 1-2 tablet  1-2 tablet Oral Q4H PRN [place] Eric J, DO   1 tablet at 07/22/19 0947  . Melatonin TABS 3 mg  3 mg Oral QHS Darnelle Bos, RPH   3 mg at 07/22/19 0020  . multivitamin with minerals tablet 1 tablet  1 tablet Oral Daily [place] Eric J, DO   1 tablet at 07/22/19 1119  . Muscle Rub CREA   Topical PRN [place] Donnamarie Poag, DO      . ondansetron [facility]) tablet 4 mg  4 mg Oral Q6H PRN [place] Donnamarie Poag, DO        Or  . ondansetron [facility]) injection 4 mg  4 mg Intravenous Q6H PRN [place] Eric J, DO   4 mg at 07/22/19 0134  . polyethylene glycol (MIRALAX / GLYCOLAX) packet 17 g  17 g Oral Daily PRN [place] Donnamarie Poag, DO      . venlafaxine [facility]) tablet 75 mg  75 mg Oral BID [place] Eric J, DO   75 mg at 07/22/19 1100     Discharge Medications: Please see discharge summary for a list of discharge medications.  Relevant Imaging Results:  Relevant Lab Results:   Additional Information [id number]  Lia Hopping, LCSW

## 2019-07-22 NOTE — Progress Notes (Addendum)
On call MD notified that pt has had small amount of output since admission to hospital, bladder scan shows 200 ml, unsuccessful I&O attempted in ED. No new orders. Will continue to monitor.    On call MD paged again regarding UOP, new order for I&O cath. Pt had large incot episode before catheter insertion. Will continue to monitor.

## 2019-07-23 DIAGNOSIS — L03119 Cellulitis of unspecified part of limb: Secondary | ICD-10-CM

## 2019-07-23 LAB — COMPREHENSIVE METABOLIC PANEL
ALT: 22 U/L (ref 0–44)
AST: 38 U/L (ref 15–41)
Albumin: 2.7 g/dL — ABNORMAL LOW (ref 3.5–5.0)
Alkaline Phosphatase: 50 U/L (ref 38–126)
Anion gap: 8 (ref 5–15)
BUN: 20 mg/dL (ref 8–23)
CO2: 26 mmol/L (ref 22–32)
Calcium: 8 mg/dL — ABNORMAL LOW (ref 8.9–10.3)
Chloride: 104 mmol/L (ref 98–111)
Creatinine, Ser: 0.52 mg/dL (ref 0.44–1.00)
GFR calc Af Amer: >60 mL/min (ref >60)
GFR calc non Af Amer: >60 mL/min (ref >60)
Glucose, Bld: 134 mg/dL — ABNORMAL HIGH (ref 70–99)
Potassium: 4 mmol/L (ref 3.5–5.1)
Sodium: 138 mmol/L (ref 135–145)
Total Bilirubin: 0.5 mg/dL (ref 0.3–1.2)
Total Protein: 5.8 g/dL — ABNORMAL LOW (ref 6.5–8.1)

## 2019-07-23 LAB — CBC
HCT: 34.9 % — ABNORMAL LOW (ref 36.0–46.0)
Hemoglobin: 11.5 g/dL — ABNORMAL LOW (ref 12.0–15.0)
MCH: 32.6 pg (ref 26.0–34.0)
MCHC: 33 g/dL (ref 30.0–36.0)
MCV: 98.9 fL (ref 80.0–100.0)
Platelets: 197 10*3/uL (ref 150–400)
RBC: 3.53 MIL/uL — ABNORMAL LOW (ref 3.87–5.11)
RDW: 12.3 % (ref 11.5–15.5)
WBC: 6.1 10*3/uL (ref 4.0–10.5)
nRBC: 0 % (ref 0.0–0.2)

## 2019-07-23 LAB — GLUCOSE, CAPILLARY: Glucose-Capillary: 110 mg/dL — ABNORMAL HIGH (ref 70–99)

## 2019-07-23 LAB — URINE CULTURE: Culture: NO GROWTH

## 2019-07-23 NOTE — Progress Notes (Addendum)
PROGRESS NOTE    Lacey Cook  JYN:[id number] DOB: 1955-09-30 DOA: 07/21/2019 PCP: Richarda Osmond, DO    Brief Narrative:   Lacey Cook is a 64 y.o. female with medical history significant of Alzheimer's dementia, depression who presents to the ED with bilateral knee pain associated with swelling, redness, and inability to walk.  Patient is accompanied by her daughter, who gives the majority of their HPI due to her underlying dementia.  Apparently patient fell 1 week ago, and has since been crawling on her knees.  At baseline she is able to ambulate with the use of a walker, but has not been unable to over the past 1 week.  Also, patient has not been eating well over the past day.  She has severe osteoarthritis, especially of the right hip.  Per daughter's report, she is been told that she is not an operative candidate due to her underlying Alzheimer's dementia.  ED Course: Temperature 98.2, HR 108, RR 17, BP 120/73, SPO2 100% on room air.  The BC count 8.1, hemoglobin 13.6, platelet count 210.  Sodium 140, potassium 3.5, chloride 104, CO2 28, BUN 17, creatinine 0.58.  Glucose 123.  Albumin 2.9.  AST 75, ALT 25, total bili 1.4.  CT head with no acute intracranial abnormalities but did note progressive asymmetric atrophy of the left frontal and temporal lobe.  Right hip x-ray with no acute fracture/dislocation with severe degenerative changes of the right hip.  Mild osteoarthritis left hip.  Left knee x-ray with no acute fracture/dislocation, advanced degenerative changes left knee.  Right knee x-ray with no acute displaced fracture/dislocation, small/moderate sized joint effusion, advanced degenerative changes.  Urinalysis and urine culture: Pending.  ED referred for admission given cellulitis and inability to walk.   Assessment & Plan:   Principal Problem:   Cellulitis of knee Active Problems:   Arthritis   Depression, recurrent (HCC)   Effusion of right knee   Alzheimer's dementia  (HCC)   Elevated LFTs   Bilateral knee cellulitis Bilateral knee wounds, present on admission Patient presenting from home, following fall 1 week ago.  Since has been crawling on her knees for mobility.  She has now developed significant eschar formation with surrounding cellulitic changes to bilateral knees.  Also noted right heel blister formation.  Patient is afebrile, without leukocytosis.  Evaluated by orthopedics, Dr. Griffin Basil; no concern for septic arthritis at this time and recommend to continue IV antibiotics and local conservative wound care. --Continue ceftriaxone 1 g IV every 24 hours --Plastic surgery, Dr. Marla Roe plans for surgical debridement on Tues 07/27/2019 at 4pm --Need to hold lovenox 36 hours prior to surgery --Wound care following --Follow CBC and fever curve closely  Elevated LFTs: Resolved AST 75, ALT 25, total bilirubin 1.4.  No abdominal symptoms.  Acute hepatitis panel nonreactive. --AST 75-->58-->38 --ALT 25-->25-->22 --Tbili 1.4-->1.2-->0.5  Chronic right hip pain secondary to advanced osteoarthritis Moderate right knee effusion Weakness, gait disturbance Daughter reports that usually at baseline able to ambulate with the use of a walker, fell 1 week ago and since has been crawling on the knees and unable to ambulate appropriately.  Has severe osteoarthritis, has been told by her PCP apparently that she is not a candidate for any operative management due to her underlying advanced Alzheimer's dementia.  X-rays bilateral hips, and knees with no acute fracture or dislocation, just notable for moderate to severe osteoarthritis with right knee effusion.  Currently patient lives with her daughter, given patient's daughter on medical issues,  currently unable to care for patient appropriately at home given her higher needs for ADLs.  Left hip CT with no fracture/AVN but with moderate subcutaneous inflammation, edema, fluid versus hematoma.  Seen by orthopedics as above,  no concern for septic arthritis and recommend to continue IV antibiotics and local wound care per wound care consult. --Orthopedics consulted, discussed with Dr. Griffin Basil --Continue PT/OT efforts while inpatient --Plan to discharge to [facility]  Advanced Alzheimer's dementia --Supportive care, not on any home medications. --Melatonin 3 mg p.o. nightly --Dementia precautions  Depression: Continue Effexor 75 mg p.o. twice daily   DVT prophylaxis: Lovenox; need to hold 36 hours prior to surgery Code Status: Full code Family Communication: Updated daughter Disposition Plan:      Patient is from: Home with daughter     Anticipated Disposition:  [name], likely on 07/26/2019     Barriers to discharge or conditions that needs to be met prior to discharge: Continues on IV antibiotics, awaiting orthopedic sign off, pending surgical debridement on 3/2  Consultants:   Orthopedics - Dr. Griffin Basil  Plastic Surgery - Dr. Marla Roe  Procedures:   none  Antimicrobials:   Ceftriaxone 2/24>>   Subjective: Patient seen and examined at bedside, resting comfortably.  Pleasantly confused.  Continues with significant bilateral knee pain, dressings in place.  Mild improvement in surrounding erythema, continues with some purulent discharge around the left knee eschar.  No family present this morning.  Unable to obtain any further ROS given her underlying dementia which is at baseline.  No acute events overnight per nursing staff.  Objective: Vitals:   07/22/19 1000 07/22/19 1426 07/22/19 2149 07/23/19 0643  BP:  118/72 111/67 125/75  Pulse:  77 96 86  Resp:  _0 Temp:  98.6 F (37 C) 98 F (36.7 C) 97.7 F (36.5 C)  TempSrc:  Oral Oral Oral  SpO2:  99% 96% 95%  Weight: 84.2 kg       Intake/Output Summary (Last 24 hours) at 07/23/2019 1036 Last data filed at 07/23/2019 0920 Gross per 24 hour  Intake 2719.26 ml  Output 1350 ml  Net 1369.26 ml   Filed Weights    07/22/19 1000  Weight: 84.2 kg    Examination:  General exam: Appears calm and comfortable, pleasantly confused Respiratory system: Clear to auscultation. Respiratory effort normal. Cardiovascular system: S1 & S2 heard, RRR. No JVD, murmurs, rubs, gallops or clicks. No pedal edema. Gastrointestinal system: Abdomen is nondistended, soft and nontender. No organomegaly or masses felt. Normal bowel sounds heard. Central nervous system: Alert, not oriented to person/place/time/situation. No focal neurological deficits. Extremities: Left knee with tenderness to palpation anterior/medial/lateral aspects with significant pain on passive range of motion, will not attempt active due to pain.  Mild pain with passive range of motion right knee, will perform active range of motion without much issue. Skin: Right knee with dry ash are without exudate.  Left knee with eschar noted with evidence of exudate in both with surrounding erythema.  Blister noted right heel Psychiatry: Judgement and insight appear normal. Mood & affect appropriate.             Data Reviewed: I have personally reviewed following labs and imaging studies  CBC: Recent Labs  Lab 07/21/19 1432 07/22/19 0316 07/23/19 0353  WBC 8.1 6.2 6.1  HGB 13.6 12.5 11.5*  HCT 40.3 38.1 34.9*  MCV 95.7 98.4 98.9  PLT 210 173 245   Basic Metabolic Panel: Recent Labs  Lab 07/21/19 1425 07/22/19 0316 07/23/19 0353  NA 140 139 138  K 3.5 3.4* 4.0  CL 104 105 104  CO2 _0 GLUCOSE 123* 113* 134*  BUN _1 CREATININE 0.58 0.52 0.52  CALCIUM 8.3* 8.1* 8.0*  MG  --  2.1  --    GFR: CrCl cannot be calculated (Unknown ideal weight.). Liver Function Tests: Recent Labs  Lab 07/21/19 1425 07/22/19 0316 07/23/19 0353  AST 75* 58* 38  ALT _2 ALKPHOS 57 54 50  BILITOT 1.4* 1.2 0.5  PROT 6.3* 6.0* 5.8*  ALBUMIN 2.9* 2.7* 2.7*   No results for input(s): LIPASE, AMYLASE in the last 168 hours. No results for  input(s): AMMONIA in the last 168 hours. Coagulation Profile: No results for input(s): INR, PROTIME in the last 168 hours. Cardiac Enzymes: Recent Labs  Lab 07/22/19 0316  CKTOTAL 760*   BNP (last 3 results) No results for input(s): PROBNP in the last 8760 hours. HbA1C: No results for input(s): HGBA1C in the last 72 hours. CBG: Recent Labs  Lab 07/21/19 2134  GLUCAP 110*   Lipid Profile: No results for input(s): CHOL, HDL, LDLCALC, TRIG, CHOLHDL, LDLDIRECT in the last 72 hours. Thyroid Function Tests: Recent Labs    07/21/19 1958  TSH 2.995   Anemia Panel: No results for input(s): VITAMINB12, FOLATE, FERRITIN, TIBC, IRON, RETICCTPCT in the last 72 hours. Sepsis Labs: No results for input(s): PROCALCITON, LATICACIDVEN in the last 168 hours.  Recent Results (from the past 240 hour(s))  SARS CORONAVIRUS 2 (TAT 6-24 HRS) Nasopharyngeal Nasopharyngeal Swab     Status: None   Collection Time: 07/21/19  5:10 PM   Specimen: Nasopharyngeal Swab  Result Value Ref Range Status   SARS Coronavirus 2 NEGATIVE NEGATIVE Final    Comment: (NOTE) SARS-CoV-2 target nucleic acids are NOT DETECTED. The SARS-CoV-2 RNA is generally detectable in upper and lower respiratory specimens during the acute phase of infection. Negative results do not preclude SARS-CoV-2 infection, do not rule out co-infections with other pathogens, and should not be used as the sole basis for treatment or other patient management decisions. Negative results must be combined with clinical observations, patient history, and epidemiological information. The expected result is Negative. Fact Sheet for Patients: SugarRoll.be Fact Sheet for Healthcare Providers: https://www.woods-mathews.com/ This test is not yet approved or cleared by the [place] FDA and  has been authorized for detection and/or diagnosis of SARS-CoV-2 by FDA under an Emergency Use Authorization (EUA).  This EUA will remain  in effect (meaning this test can be used) for the duration of the COVID-19 declaration under Section 56 4(b)(1) of the Act, 21 U.S.C. section 360bbb-3(b)(1), unless the authorization is terminated or revoked sooner. Performed at [facility], [place] [address] [place], [place] [address]          Radiology Studies: CT Head Wo Contrast  Result Date: 07/21/2019 CLINICAL DATA:  Head trauma. Altered mental status. Failure to thrive. EXAM: CT HEAD WITHOUT CONTRAST TECHNIQUE: Contiguous axial images were obtained from the base of the skull through the vertex without intravenous contrast. COMPARISON:  04/25/2017 FINDINGS: Brain: There is progressive prominent volume loss in the left frontal lobe with milder volume loss in the right frontal lobe. Asymmetric volume loss is also present in the left temporal lobe. Hypodensities in the cerebral white matter bilaterally are similar to the prior study and nonspecific but compatible with mild chronic small vessel ischemic disease. There is no  evidence of acute infarct, intracranial hemorrhage, mass, midline shift, or extra-axial fluid collection. Vascular: Calcified atherosclerosis at the skull base. No hyperdense vessel. Skull: No fracture or suspicious osseous lesion. Sinuses/Orbits: Mild mucosal thickening in the maxillary sinuses. Clear mastoid air cells. Unremarkable orbits. Other: None. IMPRESSION: 1. No evidence of acute intracranial abnormality. 2. Progressive asymmetric atrophy involving the left frontal and temporal lobes. Electronically Signed   By: Logan Bores M.D.   On: 07/21/2019 16:06   CT Hip Left Wo Contrast  Result Date: 07/21/2019 CLINICAL DATA:  Poly trauma. Unable to walk. Left hip pain. EXAM: CT OF THE LEFT HIP WITHOUT CONTRAST TECHNIQUE: Multidetector CT imaging of the left hip was performed according to the standard protocol. Multiplanar CT image reconstructions were also generated. COMPARISON:  Radiographs  07/21/2019 FINDINGS: The left hip is normally located. No hip fracture or AVN. The acetabulum is intact. No left-sided pelvic fractures are identified. The pubic symphysis and visualized left SI joint are intact. Moderate subcutaneous inflammation/edema/fluid or hematoma likely representing a direct contusion. No obvious intramuscular hematoma to suggest a significant muscle tear. Large amount of stool in the distended rectum. IMPRESSION: 1. No left hip fracture or AVN. 2. Moderate subcutaneous inflammation/edema/fluid or hematoma likely representing a direct contusion. No obvious intramuscular hematoma to suggest a significant muscle tear. 3. Large amount of stool in the distended rectum. Electronically Signed   By: Marijo Sanes M.D.   On: 07/21/2019 17:03   DG Knee Complete 4 Views Left  Result Date: 07/21/2019 CLINICAL DATA:  Pain. EXAM: LEFT KNEE - COMPLETE 4+ VIEW COMPARISON:  None. FINDINGS: There are advanced degenerative changes of the left knee, greatest within the medial and patellofemoral compartments. There is no acute displaced fracture. No dislocation. There is a small suprapatellar joint effusion. IMPRESSION: 1. No acute displaced fracture or dislocation. 2. Advanced degenerative changes of the left knee. Electronically Signed   By: Constance Holster M.D.   On: 07/21/2019 16:07   DG Knee Complete 4 Views Right  Result Date: 07/21/2019 CLINICAL DATA:  Pain EXAM: RIGHT KNEE - COMPLETE 4+ VIEW COMPARISON:  None. FINDINGS: There are advanced degenerative changes of the right knee, greatest within the medial and patellofemoral compartments. There is no acute displaced fracture. No dislocation. There is a small to moderate-sized joint effusion. IMPRESSION: 1. No acute displaced fracture or dislocation. 2. Small to moderate-sized joint effusion. 3. Advanced degenerative changes. Electronically Signed   By: Constance Holster M.D.   On: 07/21/2019 16:10   DG Hip Unilat With Pelvis 2-3 Views  Left  Result Date: 07/21/2019 CLINICAL DATA:  Pain status post fall EXAM: DG HIP (WITH OR WITHOUT PELVIS) 2-3V LEFT COMPARISON:  None. FINDINGS: There are end-stage degenerative changes of the right hip. There is mild-to-moderate osteoarthritis of the left hip. No acute displaced fracture or dislocation. There is a moderate to large amount of stool at the level of the rectum. IMPRESSION: 1. No acute displaced fracture or dislocation. 2. Severe degenerative changes of the right hip. 3. Mild-to-moderate osteoarthritis of the left hip. Electronically Signed   By: Constance Holster M.D.   On: 07/21/2019 16:07        Scheduled Meds: . bisacodyl  10 mg Rectal Once  . enoxaparin (LOVENOX) injection  40 mg Subcutaneous QHS  . feeding supplement (ENSURE ENLIVE)  237 mL Oral BID BM  . Melatonin  3 mg Oral QHS  . multivitamin with minerals  1 tablet Oral Daily  . venlafaxine  75 mg Oral  BID   Continuous Infusions: . cefTRIAXone (ROCEPHIN)  IV 1 g (07/23/19 0953)     LOS: 2 days    Time spent: 36 minutes spent on chart review, discussion with nursing staff, consultants, updating family and interview/physical exam; more than 50% of that time was spent in counseling and/or coordination of care.    Terea Neubauer J [place] DO [facility] Available via Epic secure chat 7am-7pm After these hours, please refer to coverage provider listed on [email or web address] 07/23/2019, 10:36 AM

## 2019-07-23 NOTE — TOC Progression Note (Addendum)
Transition of Care [facility]) - Progression Note    Patient Details  Name: Daeja Ogbonna MRN: [id number] Date of Birth: 08/26/1955  [facility]) CM/SW [place], LCSW Phone Number: 07/23/2019, 10:16 AM  Clinical Narrative:    CSW provided SNF bed choice. Daughter agreeable to [facility] SNF for continued rehab at discharge.  TOC staff will continue to follow for dc needs.   Expected Discharge Plan: [place] Barriers to Discharge: Continued Medical Work up  Expected Discharge Plan and Services Expected Discharge Plan: [place] In-house Referral: Clinical Social Work   Post Acute Care Choice: [place] Living arrangements for the past 2 months: Single Family Home                                       Social Determinants of Health (SDOH) Interventions    Readmission Risk Interventions No flowsheet data found.

## 2019-07-23 NOTE — Progress Notes (Signed)
   ORTHOPAEDIC PROGRESS NOTE  64 y.o. female with the following: bilateral knee wounds and cellulitis, chronic advanced arthritis of bilateral hips and knees, weakness and inability to bear weight.   SUBJECTIVE: Patient's daughter is at the bedside. She is pleasantly confused. Daughter has spoken with plastics and would like to proceed with discussion of surgery before patient is discharged to SNF. Her daughter notes she takes care of the patient, changes her, and tries to help her move around at home. Per daughter, patient used to ambulate with a walker and would walk about 30 feet. She has had chronic left hip pain for years, which is very debilitating for the patient.   OBJECTIVE: PE: General: elderly female, no acute distress, confused Right lower extremity: Examination limited due to patient's cooperation. She tolerates some gentle passive ROM, but very limited. Some tenderness to palpation about the knee, but not consistent. No noted fluctuance of fluid collection. Erythema surrounding wound seems to have improved since admission. Lower extremity edema seems to have improved as well.  Left lower extremity: Examination limited due to patient's cooperation. She tolerates some gentle passive ROM, but very limited. She reports tenderness to palpation laterally. No noted fluctuance of fluid collection. Sloughing of the base of the wound noted today. Lower extremity edema seems to have improved as well.   Vitals:   07/23/19 1035 07/23/19 1252  BP: 116/88 117/69  Pulse: 84 97  Resp: 18 18  Temp: 97.7 F (36.5 C) 98.3 F (36.8 C)  SpO2: 95% 95%    ASSESSMENT: 64 y.o. female with the following: bilateral knee wounds and cellulitis, chronic advanced arthritis of bilateral hips and knees, weakness and inability to bear weight.   PLAN: Exam limited to patient's cooperation and pain. She has eschars and surrounding cellulitis of her bilateral knees, which seems to be improving. I do not believe  septic arthritis. She is afebrile, WBC is within normal limits, no notable fluctuance or fluid collection.   - Recommendations:  Continued wound care per [facility] team recommendations. Appreciate recommendations from plastic surgery team. Continue working with PT/OT.  - Weight Bearing Status/Activity: WBAT  Per Plastic Surgery note, "Patient would benefit from debridement of eschar of bilateral knee wounds and placement of wound matrix. Consulted with plastic surgery team, could schedule for mid-week pending patient status, medical stability for surgery and if she is still hospitalized at that time."  "Daughter believes she would prefer immediate management of these wounds with surgery. At this time our earliest availability for debridement and placement of wound matrix would be Wednesday."   Orthopedics will sign off at this point. Will be available for any questions or concerns as needed. Further surgical intervention, per plastic surgery team.   Patient can follow-up with an orthopedic surgeon who performs total joint arthroplasties following discharge if needed.   Noemi Chapel, PA-C 07/23/2019

## 2019-07-23 NOTE — H&P (View-Only) (Signed)
[facility]  Reason for Consult: Bilateral Knee Wounds Referring Provider: [facility], PA-C  Lacey Cook is an 64 y.o. female.  HPI: The patient is a 64 year old female who presented to the ED 2 days ago for evaluation of worsening knee pain and difficulty walking after a fall 1 week ago.  Patient reportedly fell and had been crawling around on her knees for an extended period of time. She developed bilateral anterior knee wounds during this time. History limited to EMR review due to patient's significant dementia. She mostly states yes/no, but her replies are inconsistent - No family at bedside.   Patient has a past medical history of alzheimer's dementia, bilateral knee osteoarthritis, HTN, seizure disorder, CVA.   Patient admitted for cellulitis of b/l knees - receiving IV antibiotics. Imaging showed advanced degenerative changes of b/l knees and small/mod joint effusion of right knee. Imaging also showed severe degenerative changes of right hip, mild/mod OA changes of L hip.  Spoke with daughter on the phone after consult - she reports a family history of malignant hyperthermia with anesthesia. Daughter also reports a significant famhx of advanced diabetes, reporting patient's brother had an amputation after a similar wound that never healed. Patients most recent A1c 11/24/2018 - 4.9. Glucose levels during admission 123, 113, 134. Daughter does not have power of attorney for mother, but reports they previously tried to set it up but did not complete it with PCP.   Past Medical History:  Diagnosis Date  . Chronic right hip pain   . Diabetes mellitus without complication ([place])   . Hypertension   . Renal disorder   . Seizures ([place])   . Stroke [facility])     History reviewed. No pertinent surgical history.  No family history on file.  Social History:  reports that she has never smoked. She has never used smokeless tobacco. She reports that she does not drink alcohol  or use drugs.  Allergies:  Allergies  Allergen Reactions  . Shellfish Allergy Anaphylaxis    Throat swells  . Sulfa Antibiotics Anaphylaxis    Throat Swelling    Medications: I have reviewed the patient's current medications.  Results for orders placed or performed during the hospital encounter of 07/21/19 (from the past 48 hour(s))  Comprehensive metabolic panel     Status: Abnormal   Collection Time: 07/21/19  2:25 PM  Result Value Ref Range   Sodium 140 135 - 145 mmol/L   Potassium 3.5 3.5 - 5.1 mmol/L   Chloride 104 98 - 111 mmol/L   CO2 28 22 - 32 mmol/L   Glucose, Bld 123 (H) 70 - 99 mg/dL    Comment: Glucose reference range applies only to samples taken after fasting for at least 8 hours.   BUN 17 8 - 23 mg/dL   Creatinine, Ser 0.58 0.44 - 1.00 mg/dL   Calcium 8.3 (L) 8.9 - 10.3 mg/dL   Total Protein 6.3 (L) 6.5 - 8.1 g/dL   Albumin 2.9 (L) 3.5 - 5.0 g/dL   AST 75 (H) 15 - 41 U/L   ALT 25 0 - 44 U/L   Alkaline Phosphatase 57 38 - 126 U/L   Total Bilirubin 1.4 (H) 0.3 - 1.2 mg/dL   GFR calc non Af Amer >60 >60 mL/min   GFR calc Af Amer >60 >60 mL/min   Anion gap 8 5 - 15    Comment: Performed at [facility], [place] [address] [place], [place] [address]  Urinalysis,  Routine w reflex microscopic     Status: None   Collection Time: 07/21/19  2:31 PM  Result Value Ref Range   Color, Urine YELLOW YELLOW   APPearance CLEAR CLEAR   Specific Gravity, Urine 1.010 1.005 - 1.030   pH 6.0 5.0 - 8.0   Glucose, UA NEGATIVE NEGATIVE mg/dL   Hgb urine dipstick NEGATIVE NEGATIVE   Bilirubin Urine NEGATIVE NEGATIVE   Ketones, ur NEGATIVE NEGATIVE mg/dL   Protein, ur NEGATIVE NEGATIVE mg/dL   Nitrite NEGATIVE NEGATIVE   Leukocytes,Ua NEGATIVE NEGATIVE    Comment: Performed at [facility], [place] [address] [place], [place] [address]  CBC     Status: None   Collection Time: 07/21/19  2:32 PM  Result Value Ref Range   WBC 8.1 4.0 - 10.5 K/uL     RBC 4.21 3.87 - 5.11 MIL/uL   Hemoglobin 13.6 12.0 - 15.0 g/dL   HCT 40.3 36.0 - 46.0 %   MCV 95.7 80.0 - 100.0 fL   MCH 32.3 26.0 - 34.0 pg   MCHC 33.7 30.0 - 36.0 g/dL   RDW 12.3 11.5 - 15.5 %   Platelets 210 150 - 400 K/uL   nRBC 0.0 0.0 - 0.2 %    Comment: Performed at [facility], [place] [address], [place], [place] [address]  SARS CORONAVIRUS 2 (TAT 6-24 HRS) Nasopharyngeal Nasopharyngeal Swab     Status: None   Collection Time: 07/21/19  5:10 PM   Specimen: Nasopharyngeal Swab  Result Value Ref Range   SARS Coronavirus 2 NEGATIVE NEGATIVE    Comment: (NOTE) SARS-CoV-2 target nucleic acids are NOT DETECTED. The SARS-CoV-2 RNA is generally detectable in upper and lower respiratory specimens during the acute phase of infection. Negative results do not preclude SARS-CoV-2 infection, do not rule out co-infections with other pathogens, and should not be used as the sole basis for treatment or other patient management decisions. Negative results must be combined with clinical observations, patient history, and epidemiological information. The expected result is Negative. Fact Sheet for Patients: SugarRoll.be Fact Sheet for Healthcare Providers: https://www.woods-mathews.com/ This test is not yet approved or cleared by the [place] FDA and  has been authorized for detection and/or diagnosis of SARS-CoV-2 by FDA under an Emergency Use Authorization (EUA). This EUA will remain  in effect (meaning this test can be used) for the duration of the COVID-19 declaration under Section 56 4(b)(1) of the Act, 21 U.S.C. section 360bbb-3(b)(1), unless the authorization is terminated or revoked sooner. Performed at [facility], [place] [address] [place], [place] [address]   HIV Antibody (routine testing w rflx)     Status: None   Collection Time: 07/21/19  7:58 PM  Result Value Ref Range   HIV Screen 4th Generation wRfx  NON REACTIVE NON REACTIVE    Comment: Performed at [facility], [place] [address] [place], [place] [address]  TSH     Status: None   Collection Time: 07/21/19  7:58 PM  Result Value Ref Range   TSH 2.995 0.350 - 4.500 uIU/mL    Comment: Performed by a 3rd Generation assay with a functional sensitivity of <=0.01 uIU/mL. Performed at [facility], [place] [address], [place],  [address]   Hepatitis panel, acute     Status: None   Collection Time: 07/21/19  7:58 PM  Result Value Ref Range   Hepatitis B Surface Ag NON REACTIVE NON REACTIVE   HCV Ab NON  REACTIVE NON REACTIVE    Comment: (NOTE) Nonreactive HCV antibody screen is consistent with no HCV infections,  unless recent infection is suspected or other evidence exists to indicate HCV infection.    Hep A IgM NON REACTIVE NON REACTIVE   Hep B C IgM NON REACTIVE NON REACTIVE    Comment: Performed at [facility], [place] [address] [place], [place] [address]  Glucose, capillary     Status: Abnormal   Collection Time: 07/21/19  9:34 PM  Result Value Ref Range   Glucose-Capillary 110 (H) 70 - 99 mg/dL    Comment: Glucose reference range applies only to samples taken after fasting for at least 8 hours.  CBC     Status: None   Collection Time: 07/22/19  3:16 AM  Result Value Ref Range   WBC 6.2 4.0 - 10.5 K/uL   RBC 3.87 3.87 - 5.11 MIL/uL   Hemoglobin 12.5 12.0 - 15.0 g/dL   HCT 38.1 36.0 - 46.0 %   MCV 98.4 80.0 - 100.0 fL   MCH 32.3 26.0 - 34.0 pg   MCHC 32.8 30.0 - 36.0 g/dL   RDW 12.4 11.5 - 15.5 %   Platelets 173 150 - 400 K/uL   nRBC 0.0 0.0 - 0.2 %    Comment: Performed at [facility], [place] [address], [place], [place] [address]  Comprehensive metabolic panel     Status: Abnormal   Collection Time: 07/22/19  3:16 AM  Result Value Ref Range   Sodium 139 135 - 145 mmol/L   Potassium 3.4 (L) 3.5 - 5.1 mmol/L   Chloride 105 98 - 111 mmol/L   CO2 24 22 - 32 mmol/L    Glucose, Bld 113 (H) 70 - 99 mg/dL    Comment: Glucose reference range applies only to samples taken after fasting for at least 8 hours.   BUN 19 8 - 23 mg/dL   Creatinine, Ser 0.52 0.44 - 1.00 mg/dL   Calcium 8.1 (L) 8.9 - 10.3 mg/dL   Total Protein 6.0 (L) 6.5 - 8.1 g/dL   Albumin 2.7 (L) 3.5 - 5.0 g/dL   AST 58 (H) 15 - 41 U/L   ALT 25 0 - 44 U/L   Alkaline Phosphatase 54 38 - 126 U/L   Total Bilirubin 1.2 0.3 - 1.2 mg/dL   GFR calc non Af Amer >60 >60 mL/min   GFR calc Af Amer >60 >60 mL/min   Anion gap 10 5 - 15    Comment: Performed at [facility], [place] [address] [place], [place] [address]  Magnesium     Status: None   Collection Time: 07/22/19  3:16 AM  Result Value Ref Range   Magnesium 2.1 1.7 - 2.4 mg/dL    Comment: Performed at [facility], [place] [address], [place], [place] [address]  CK     Status: Abnormal   Collection Time: 07/22/19  3:16 AM  Result Value Ref Range   Total CK 760 (H) 38 - 234 U/L    Comment: Performed at [facility], [place] [address], [place], [place] [address]  Comprehensive metabolic panel     Status: Abnormal   Collection Time: 07/23/19  3:53 AM  Result Value Ref Range   Sodium 138 135 - 145 mmol/L   Potassium 4.0 3.5 - 5.1 mmol/L   Chloride 104 98 - 111 mmol/L   CO2 26 22 - 32 mmol/L   Glucose, Bld  134 (H) 70 - 99 mg/dL    Comment: Glucose reference range applies only to samples taken after fasting for at least 8 hours.   BUN 20 8 - 23 mg/dL   Creatinine, Ser 0.52 0.44 - 1.00 mg/dL   Calcium 8.0 (L) 8.9 - 10.3 mg/dL   Total Protein 5.8 (L) 6.5 - 8.1 g/dL   Albumin 2.7 (L) 3.5 - 5.0 g/dL   AST 38 15 - 41 U/L   ALT 22 0 - 44 U/L   Alkaline Phosphatase 50 38 - 126 U/L   Total Bilirubin 0.5 0.3 - 1.2 mg/dL   GFR calc non Af Amer >60 >60 mL/min   GFR calc Af Amer >60 >60 mL/min   Anion gap 8 5 - 15    Comment: Performed at [facility], [place] [address]  [place], [place] [address]  CBC     Status: Abnormal   Collection Time: 07/23/19  3:53 AM  Result Value Ref Range   WBC 6.1 4.0 - 10.5 K/uL   RBC 3.53 (L) 3.87 - 5.11 MIL/uL   Hemoglobin 11.5 (L) 12.0 - 15.0 g/dL   HCT 34.9 (L) 36.0 - 46.0 %   MCV 98.9 80.0 - 100.0 fL   MCH 32.6 26.0 - 34.0 pg   MCHC 33.0 30.0 - 36.0 g/dL   RDW 12.3 11.5 - 15.5 %   Platelets 197 150 - 400 K/uL   nRBC 0.0 0.0 - 0.2 %    Comment: Performed at [facility], [place] [address] [place], [place] [address]    CT Head Wo Contrast  Result Date: 07/21/2019 CLINICAL DATA:  Head trauma. Altered mental status. Failure to thrive. EXAM: CT HEAD WITHOUT CONTRAST TECHNIQUE: Contiguous axial images were obtained from the base of the skull through the vertex without intravenous contrast. COMPARISON:  04/25/2017 FINDINGS: Brain: There is progressive prominent volume loss in the left frontal lobe with milder volume loss in the right frontal lobe. Asymmetric volume loss is also present in the left temporal lobe. Hypodensities in the cerebral white matter bilaterally are similar to the prior study and nonspecific but compatible with mild chronic small vessel ischemic disease. There is no evidence of acute infarct, intracranial hemorrhage, mass, midline shift, or extra-axial fluid collection. Vascular: Calcified atherosclerosis at the skull base. No hyperdense vessel. Skull: No fracture or suspicious osseous lesion. Sinuses/Orbits: Mild mucosal thickening in the maxillary sinuses. Clear mastoid air cells. Unremarkable orbits. Other: None. IMPRESSION: 1. No evidence of acute intracranial abnormality. 2. Progressive asymmetric atrophy involving the left frontal and temporal lobes. Electronically Signed   By: Logan Bores M.D.   On: 07/21/2019 16:06   CT Hip Left Wo Contrast  Result Date: 07/21/2019 CLINICAL DATA:  Poly trauma. Unable to walk. Left hip pain. EXAM: CT OF THE LEFT HIP WITHOUT CONTRAST TECHNIQUE: Multidetector CT  imaging of the left hip was performed according to the standard protocol. Multiplanar CT image reconstructions were also generated. COMPARISON:  Radiographs 07/21/2019 FINDINGS: The left hip is normally located. No hip fracture or AVN. The acetabulum is intact. No left-sided pelvic fractures are identified. The pubic symphysis and visualized left SI joint are intact. Moderate subcutaneous inflammation/edema/fluid or hematoma likely representing a direct contusion. No obvious intramuscular hematoma to suggest a significant muscle tear. Large amount of stool in the distended rectum. IMPRESSION: 1. No left hip fracture or AVN. 2. Moderate subcutaneous inflammation/edema/fluid or hematoma likely representing a direct contusion. No obvious intramuscular hematoma to suggest a  significant muscle tear. 3. Large amount of stool in the distended rectum. Electronically Signed   By: Marijo Sanes M.D.   On: 07/21/2019 17:03   DG Knee Complete 4 Views Left  Result Date: 07/21/2019 CLINICAL DATA:  Pain. EXAM: LEFT KNEE - COMPLETE 4+ VIEW COMPARISON:  None. FINDINGS: There are advanced degenerative changes of the left knee, greatest within the medial and patellofemoral compartments. There is no acute displaced fracture. No dislocation. There is a small suprapatellar joint effusion. IMPRESSION: 1. No acute displaced fracture or dislocation. 2. Advanced degenerative changes of the left knee. Electronically Signed   By: Constance Holster M.D.   On: 07/21/2019 16:07   DG Knee Complete 4 Views Right  Result Date: 07/21/2019 CLINICAL DATA:  Pain EXAM: RIGHT KNEE - COMPLETE 4+ VIEW COMPARISON:  None. FINDINGS: There are advanced degenerative changes of the right knee, greatest within the medial and patellofemoral compartments. There is no acute displaced fracture. No dislocation. There is a small to moderate-sized joint effusion. IMPRESSION: 1. No acute displaced fracture or dislocation. 2. Small to moderate-sized joint  effusion. 3. Advanced degenerative changes. Electronically Signed   By: Constance Holster M.D.   On: 07/21/2019 16:10   DG Hip Unilat With Pelvis 2-3 Views Left  Result Date: 07/21/2019 CLINICAL DATA:  Pain status post fall EXAM: DG HIP (WITH OR WITHOUT PELVIS) 2-3V LEFT COMPARISON:  None. FINDINGS: There are end-stage degenerative changes of the right hip. There is mild-to-moderate osteoarthritis of the left hip. No acute displaced fracture or dislocation. There is a moderate to large amount of stool at the level of the rectum. IMPRESSION: 1. No acute displaced fracture or dislocation. 2. Severe degenerative changes of the right hip. 3. Mild-to-moderate osteoarthritis of the left hip. Electronically Signed   By: Constance Holster M.D.   On: 07/21/2019 16:07    Review of Systems  Unable to perform ROS: Dementia   Blood pressure 116/88, pulse 84, temperature 97.7 F (36.5 C), temperature source Oral, resp. rate 18, weight 84.2 kg, SpO2 95 %. Physical Exam  Constitutional: She appears well-developed and well-nourished. No distress.  HENT:  Head: Normocephalic and atraumatic.  Cardiovascular:  Pulses:      Radial pulses are 2+ on the right side and 2+ on the left side.       Dorsalis pedis pulses are 2+ on the right side and 2+ on the left side.  Respiratory: Effort normal.  Musculoskeletal:       Legs:  Neurological:  Demented, only response to questioning is yes/no, which is inconsistent.   Skin: Skin is warm and dry. She is not diaphoretic.   Left knee:   Assessment/Plan:  Bilateral Knee Wounds:   Recommend continued wound care per [facility] team recommendations. Dressing changed with RN today. Xeroform applied to R knee and L knee. L knee wound improving. R knee eschar stable and no change to eschar from prior photos noted - periwound erythema noted.  Ensure and MV ordered by primary team.   Patient would benefit from debridement of eschar of bilateral knee wounds and placement  of wound matrix. Consulted with plastic surgery team, scheduled for 07/27/19.  Spoke with daughter, recommend obtaining power of attorney as patient cannot make medical decisions for herself. Daughter reports she previously tried to set this up with PCP, but did not complete all the necessary paperwork due to need for multiple witnesses. Consult to case management ordered.  Patient daughter reports family history of malignant hyperthermia - recommend discussing  with anesthesia pre-operatively. - MAC will be okay for surgery.  Pictures were obtained of the patient and placed in the chart with the patient's or guardian's permission.  Carola Rhine Aubriauna Riner, PA-C 07/23/2019, 11:02 AM

## 2019-07-23 NOTE — Consult Note (Addendum)
[facility]  Reason for Consult: Bilateral Knee Wounds Referring Provider: [facility], PA-C  Lacey Cook is an 64 y.o. female.  HPI: The patient is a 64 year old female who presented to the ED 2 days ago for evaluation of worsening knee pain and difficulty walking after a fall 1 week ago.  Patient reportedly fell and had been crawling around on her knees for an extended period of time. She developed bilateral anterior knee wounds during this time. History limited to EMR review due to patient's significant dementia. She mostly states yes/no, but her replies are inconsistent - No family at bedside.   Patient has a past medical history of alzheimer's dementia, bilateral knee osteoarthritis, HTN, seizure disorder, CVA.   Patient admitted for cellulitis of b/l knees - receiving IV antibiotics. Imaging showed advanced degenerative changes of b/l knees and small/mod joint effusion of right knee. Imaging also showed severe degenerative changes of right hip, mild/mod OA changes of L hip.  Spoke with daughter on the phone after consult - she reports a family history of malignant hyperthermia with anesthesia. Daughter also reports a significant famhx of advanced diabetes, reporting patient's brother had an amputation after a similar wound that never healed. Patients most recent A1c 11/24/2018 - 4.9. Glucose levels during admission 123, 113, 134. Daughter does not have power of attorney for mother, but reports they previously tried to set it up but did not complete it with PCP.   Past Medical History:  Diagnosis Date  . Chronic right hip pain   . Diabetes mellitus without complication ([place])   . Hypertension   . Renal disorder   . Seizures ([place])   . Stroke [facility])     History reviewed. No pertinent surgical history.  No family history on file.  Social History:  reports that she has never smoked. She has never used smokeless tobacco. She reports that she does not drink alcohol  or use drugs.  Allergies:  Allergies  Allergen Reactions  . Shellfish Allergy Anaphylaxis    Throat swells  . Sulfa Antibiotics Anaphylaxis    Throat Swelling    Medications: I have reviewed the patient's current medications.  Results for orders placed or performed during the hospital encounter of 07/21/19 (from the past 48 hour(s))  Comprehensive metabolic panel     Status: Abnormal   Collection Time: 07/21/19  2:25 PM  Result Value Ref Range   Sodium 140 135 - 145 mmol/L   Potassium 3.5 3.5 - 5.1 mmol/L   Chloride 104 98 - 111 mmol/L   CO2 28 22 - 32 mmol/L   Glucose, Bld 123 (H) 70 - 99 mg/dL    Comment: Glucose reference range applies only to samples taken after fasting for at least 8 hours.   BUN 17 8 - 23 mg/dL   Creatinine, Ser 0.58 0.44 - 1.00 mg/dL   Calcium 8.3 (L) 8.9 - 10.3 mg/dL   Total Protein 6.3 (L) 6.5 - 8.1 g/dL   Albumin 2.9 (L) 3.5 - 5.0 g/dL   AST 75 (H) 15 - 41 U/L   ALT 25 0 - 44 U/L   Alkaline Phosphatase 57 38 - 126 U/L   Total Bilirubin 1.4 (H) 0.3 - 1.2 mg/dL   GFR calc non Af Amer >60 >60 mL/min   GFR calc Af Amer >60 >60 mL/min   Anion gap 8 5 - 15    Comment: Performed at [facility], [place] [address] [place], [place] [address]  Urinalysis,  Routine w reflex microscopic     Status: None   Collection Time: 07/21/19  2:31 PM  Result Value Ref Range   Color, Urine YELLOW YELLOW   APPearance CLEAR CLEAR   Specific Gravity, Urine 1.010 1.005 - 1.030   pH 6.0 5.0 - 8.0   Glucose, UA NEGATIVE NEGATIVE mg/dL   Hgb urine dipstick NEGATIVE NEGATIVE   Bilirubin Urine NEGATIVE NEGATIVE   Ketones, ur NEGATIVE NEGATIVE mg/dL   Protein, ur NEGATIVE NEGATIVE mg/dL   Nitrite NEGATIVE NEGATIVE   Leukocytes,Ua NEGATIVE NEGATIVE    Comment: Performed at [facility], [place] [address], [place], [place] [address]  CBC     Status: None   Collection Time: 07/21/19  2:32 PM  Result Value Ref Range   WBC 8.1 4.0 - 10.5 K/uL     RBC 4.21 3.87 - 5.11 MIL/uL   Hemoglobin 13.6 12.0 - 15.0 g/dL   HCT 40.3 36.0 - 46.0 %   MCV 95.7 80.0 - 100.0 fL   MCH 32.3 26.0 - 34.0 pg   MCHC 33.7 30.0 - 36.0 g/dL   RDW 12.3 11.5 - 15.5 %   Platelets 210 150 - 400 K/uL   nRBC 0.0 0.0 - 0.2 %    Comment: Performed at [facility], [place] [address] [place], [place] [address]  SARS CORONAVIRUS 2 (TAT 6-24 HRS) Nasopharyngeal Nasopharyngeal Swab     Status: None   Collection Time: 07/21/19  5:10 PM   Specimen: Nasopharyngeal Swab  Result Value Ref Range   SARS Coronavirus 2 NEGATIVE NEGATIVE    Comment: (NOTE) SARS-CoV-2 target nucleic acids are NOT DETECTED. The SARS-CoV-2 RNA is generally detectable in upper and lower respiratory specimens during the acute phase of infection. Negative results do not preclude SARS-CoV-2 infection, do not rule out co-infections with other pathogens, and should not be used as the sole basis for treatment or other patient management decisions. Negative results must be combined with clinical observations, patient history, and epidemiological information. The expected result is Negative. Fact Sheet for Patients: SugarRoll.be Fact Sheet for Healthcare Providers: https://www.woods-mathews.com/ This test is not yet approved or cleared by the [place] FDA and  has been authorized for detection and/or diagnosis of SARS-CoV-2 by FDA under an Emergency Use Authorization (EUA). This EUA will remain  in effect (meaning this test can be used) for the duration of the COVID-19 declaration under Section 56 4(b)(1) of the Act, 21 U.S.C. section 360bbb-3(b)(1), unless the authorization is terminated or revoked sooner. Performed at [facility], [place] [address] [place], [place] [address]   HIV Antibody (routine testing w rflx)     Status: None   Collection Time: 07/21/19  7:58 PM  Result Value Ref Range   HIV Screen 4th Generation wRfx  NON REACTIVE NON REACTIVE    Comment: Performed at [facility], [place] [address] [place], [place] [address]  TSH     Status: None   Collection Time: 07/21/19  7:58 PM  Result Value Ref Range   TSH 2.995 0.350 - 4.500 uIU/mL    Comment: Performed by a 3rd Generation assay with a functional sensitivity of <=0.01 uIU/mL. Performed at [facility], [place] [address], [place], [place] [address]   Hepatitis panel, acute     Status: None   Collection Time: 07/21/19  7:58 PM  Result Value Ref Range   Hepatitis B Surface Ag NON REACTIVE NON REACTIVE   HCV Ab NON  REACTIVE NON REACTIVE    Comment: (NOTE) Nonreactive HCV antibody screen is consistent with no HCV infections,  unless recent infection is suspected or other evidence exists to indicate HCV infection.    Hep A IgM NON REACTIVE NON REACTIVE   Hep B C IgM NON REACTIVE NON REACTIVE    Comment: Performed at [facility], [place] [address], [place], [place] [address]  Glucose, capillary     Status: Abnormal   Collection Time: 07/21/19  9:34 PM  Result Value Ref Range   Glucose-Capillary 110 (H) 70 - 99 mg/dL    Comment: Glucose reference range applies only to samples taken after fasting for at least 8 hours.  CBC     Status: None   Collection Time: 07/22/19  3:16 AM  Result Value Ref Range   WBC 6.2 4.0 - 10.5 K/uL   RBC 3.87 3.87 - 5.11 MIL/uL   Hemoglobin 12.5 12.0 - 15.0 g/dL   HCT 38.1 36.0 - 46.0 %   MCV 98.4 80.0 - 100.0 fL   MCH 32.3 26.0 - 34.0 pg   MCHC 32.8 30.0 - 36.0 g/dL   RDW 12.4 11.5 - 15.5 %   Platelets 173 150 - 400 K/uL   nRBC 0.0 0.0 - 0.2 %    Comment: Performed at [facility], [place] [address], [place], [place] [address]  Comprehensive metabolic panel     Status: Abnormal   Collection Time: 07/22/19  3:16 AM  Result Value Ref Range   Sodium 139 135 - 145 mmol/L   Potassium 3.4 (L) 3.5 - 5.1 mmol/L   Chloride 105 98 - 111 mmol/L   CO2 24 22 - 32 mmol/L    Glucose, Bld 113 (H) 70 - 99 mg/dL    Comment: Glucose reference range applies only to samples taken after fasting for at least 8 hours.   BUN 19 8 - 23 mg/dL   Creatinine, Ser 0.52 0.44 - 1.00 mg/dL   Calcium 8.1 (L) 8.9 - 10.3 mg/dL   Total Protein 6.0 (L) 6.5 - 8.1 g/dL   Albumin 2.7 (L) 3.5 - 5.0 g/dL   AST 58 (H) 15 - 41 U/L   ALT 25 0 - 44 U/L   Alkaline Phosphatase 54 38 - 126 U/L   Total Bilirubin 1.2 0.3 - 1.2 mg/dL   GFR calc non Af Amer >60 >60 mL/min   GFR calc Af Amer >60 >60 mL/min   Anion gap 10 5 - 15    Comment: Performed at [facility], [place] [address] [place], [place] [address]  Magnesium     Status: None   Collection Time: 07/22/19  3:16 AM  Result Value Ref Range   Magnesium 2.1 1.7 - 2.4 mg/dL    Comment: Performed at [facility], [place] [address] [place], [place] [address]  CK     Status: Abnormal   Collection Time: 07/22/19  3:16 AM  Result Value Ref Range   Total CK 760 (H) 38 - 234 U/L    Comment: Performed at [facility], [place] [address], [place], [place] [address]  Comprehensive metabolic panel     Status: Abnormal   Collection Time: 07/23/19  3:53 AM  Result Value Ref Range   Sodium 138 135 - 145 mmol/L   Potassium 4.0 3.5 - 5.1 mmol/L   Chloride 104 98 - 111 mmol/L   CO2 26 22 - 32 mmol/L   Glucose, Bld  134 (H) 70 - 99 mg/dL    Comment: Glucose reference range applies only to samples taken after fasting for at least 8 hours.   BUN 20 8 - 23 mg/dL   Creatinine, Ser 0.52 0.44 - 1.00 mg/dL   Calcium 8.0 (L) 8.9 - 10.3 mg/dL   Total Protein 5.8 (L) 6.5 - 8.1 g/dL   Albumin 2.7 (L) 3.5 - 5.0 g/dL   AST 38 15 - 41 U/L   ALT 22 0 - 44 U/L   Alkaline Phosphatase 50 38 - 126 U/L   Total Bilirubin 0.5 0.3 - 1.2 mg/dL   GFR calc non Af Amer >60 >60 mL/min   GFR calc Af Amer >60 >60 mL/min   Anion gap 8 5 - 15    Comment: Performed at [facility], [place] [address],  [place], [place] [address]  CBC     Status: Abnormal   Collection Time: 07/23/19  3:53 AM  Result Value Ref Range   WBC 6.1 4.0 - 10.5 K/uL   RBC 3.53 (L) 3.87 - 5.11 MIL/uL   Hemoglobin 11.5 (L) 12.0 - 15.0 g/dL   HCT 34.9 (L) 36.0 - 46.0 %   MCV 98.9 80.0 - 100.0 fL   MCH 32.6 26.0 - 34.0 pg   MCHC 33.0 30.0 - 36.0 g/dL   RDW 12.3 11.5 - 15.5 %   Platelets 197 150 - 400 K/uL   nRBC 0.0 0.0 - 0.2 %    Comment: Performed at [facility], [place] [address], [place], [place] [address]    CT Head Wo Contrast  Result Date: 07/21/2019 CLINICAL DATA:  Head trauma. Altered mental status. Failure to thrive. EXAM: CT HEAD WITHOUT CONTRAST TECHNIQUE: Contiguous axial images were obtained from the base of the skull through the vertex without intravenous contrast. COMPARISON:  04/25/2017 FINDINGS: Brain: There is progressive prominent volume loss in the left frontal lobe with milder volume loss in the right frontal lobe. Asymmetric volume loss is also present in the left temporal lobe. Hypodensities in the cerebral white matter bilaterally are similar to the prior study and nonspecific but compatible with mild chronic small vessel ischemic disease. There is no evidence of acute infarct, intracranial hemorrhage, mass, midline shift, or extra-axial fluid collection. Vascular: Calcified atherosclerosis at the skull base. No hyperdense vessel. Skull: No fracture or suspicious osseous lesion. Sinuses/Orbits: Mild mucosal thickening in the maxillary sinuses. Clear mastoid air cells. Unremarkable orbits. Other: None. IMPRESSION: 1. No evidence of acute intracranial abnormality. 2. Progressive asymmetric atrophy involving the left frontal and temporal lobes. Electronically Signed   By: Logan Bores M.D.   On: 07/21/2019 16:06   CT Hip Left Wo Contrast  Result Date: 07/21/2019 CLINICAL DATA:  Poly trauma. Unable to walk. Left hip pain. EXAM: CT OF THE LEFT HIP WITHOUT CONTRAST TECHNIQUE: Multidetector CT  imaging of the left hip was performed according to the standard protocol. Multiplanar CT image reconstructions were also generated. COMPARISON:  Radiographs 07/21/2019 FINDINGS: The left hip is normally located. No hip fracture or AVN. The acetabulum is intact. No left-sided pelvic fractures are identified. The pubic symphysis and visualized left SI joint are intact. Moderate subcutaneous inflammation/edema/fluid or hematoma likely representing a direct contusion. No obvious intramuscular hematoma to suggest a significant muscle tear. Large amount of stool in the distended rectum. IMPRESSION: 1. No left hip fracture or AVN. 2. Moderate subcutaneous inflammation/edema/fluid or hematoma likely representing a direct contusion. No obvious intramuscular hematoma to suggest a  significant muscle tear. 3. Large amount of stool in the distended rectum. Electronically Signed   By: Marijo Sanes M.D.   On: 07/21/2019 17:03   DG Knee Complete 4 Views Left  Result Date: 07/21/2019 CLINICAL DATA:  Pain. EXAM: LEFT KNEE - COMPLETE 4+ VIEW COMPARISON:  None. FINDINGS: There are advanced degenerative changes of the left knee, greatest within the medial and patellofemoral compartments. There is no acute displaced fracture. No dislocation. There is a small suprapatellar joint effusion. IMPRESSION: 1. No acute displaced fracture or dislocation. 2. Advanced degenerative changes of the left knee. Electronically Signed   By: Constance Holster M.D.   On: 07/21/2019 16:07   DG Knee Complete 4 Views Right  Result Date: 07/21/2019 CLINICAL DATA:  Pain EXAM: RIGHT KNEE - COMPLETE 4+ VIEW COMPARISON:  None. FINDINGS: There are advanced degenerative changes of the right knee, greatest within the medial and patellofemoral compartments. There is no acute displaced fracture. No dislocation. There is a small to moderate-sized joint effusion. IMPRESSION: 1. No acute displaced fracture or dislocation. 2. Small to moderate-sized joint  effusion. 3. Advanced degenerative changes. Electronically Signed   By: Constance Holster M.D.   On: 07/21/2019 16:10   DG Hip Unilat With Pelvis 2-3 Views Left  Result Date: 07/21/2019 CLINICAL DATA:  Pain status post fall EXAM: DG HIP (WITH OR WITHOUT PELVIS) 2-3V LEFT COMPARISON:  None. FINDINGS: There are end-stage degenerative changes of the right hip. There is mild-to-moderate osteoarthritis of the left hip. No acute displaced fracture or dislocation. There is a moderate to large amount of stool at the level of the rectum. IMPRESSION: 1. No acute displaced fracture or dislocation. 2. Severe degenerative changes of the right hip. 3. Mild-to-moderate osteoarthritis of the left hip. Electronically Signed   By: Constance Holster M.D.   On: 07/21/2019 16:07    Review of Systems  Unable to perform ROS: Dementia   Blood pressure 116/88, pulse 84, temperature 97.7 F (36.5 C), temperature source Oral, resp. rate 18, weight 84.2 kg, SpO2 95 %. Physical Exam  Constitutional: She appears well-developed and well-nourished. No distress.  HENT:  Head: Normocephalic and atraumatic.  Cardiovascular:  Pulses:      Radial pulses are 2+ on the right side and 2+ on the left side.       Dorsalis pedis pulses are 2+ on the right side and 2+ on the left side.  Respiratory: Effort normal.  Musculoskeletal:       Legs:  Neurological:  Demented, only response to questioning is yes/no, which is inconsistent.   Skin: Skin is warm and dry. She is not diaphoretic.   Left knee:   Assessment/Plan:  Bilateral Knee Wounds:   Recommend continued wound care per [facility] team recommendations. Dressing changed with RN today. Xeroform applied to R knee and L knee. L knee wound improving. R knee eschar stable and no change to eschar from prior photos noted - periwound erythema noted.  Ensure and MV ordered by primary team.   Patient would benefit from debridement of eschar of bilateral knee wounds and placement  of wound matrix. Consulted with plastic surgery team, scheduled for 07/27/19.  Spoke with daughter, recommend obtaining power of attorney as patient cannot make medical decisions for herself. Daughter reports she previously tried to set this up with PCP, but did not complete all the necessary paperwork due to need for multiple witnesses. Consult to case management ordered.  Patient daughter reports family history of malignant hyperthermia - recommend discussing  with anesthesia pre-operatively. - MAC will be okay for surgery.  Pictures were obtained of the patient and placed in the chart with the patient's or guardian's permission.  Carola Rhine Kiele Heavrin, PA-C 07/23/2019, 11:02 AM

## 2019-07-24 DIAGNOSIS — M25461 Effusion, right knee: Secondary | ICD-10-CM

## 2019-07-24 DIAGNOSIS — F0280 Dementia in other diseases classified elsewhere without behavioral disturbance: Secondary | ICD-10-CM

## 2019-07-24 DIAGNOSIS — F339 Major depressive disorder, recurrent, unspecified: Secondary | ICD-10-CM

## 2019-07-24 DIAGNOSIS — M1990 Unspecified osteoarthritis, unspecified site: Secondary | ICD-10-CM

## 2019-07-24 DIAGNOSIS — G300 Alzheimer's disease with early onset: Secondary | ICD-10-CM

## 2019-07-24 LAB — CBC
HCT: 36.2 % (ref 36.0–46.0)
Hemoglobin: 12.2 g/dL (ref 12.0–15.0)
MCH: 33.2 pg (ref 26.0–34.0)
MCHC: 33.7 g/dL (ref 30.0–36.0)
MCV: 98.4 fL (ref 80.0–100.0)
Platelets: 228 10*3/uL (ref 150–400)
RBC: 3.68 MIL/uL — ABNORMAL LOW (ref 3.87–5.11)
RDW: 12.4 % (ref 11.5–15.5)
WBC: 8 10*3/uL (ref 4.0–10.5)
nRBC: 0 % (ref 0.0–0.2)

## 2019-07-24 LAB — COMPREHENSIVE METABOLIC PANEL
ALT: 23 U/L (ref 0–44)
AST: 38 U/L (ref 15–41)
Albumin: 2.6 g/dL — ABNORMAL LOW (ref 3.5–5.0)
Alkaline Phosphatase: 53 U/L (ref 38–126)
Anion gap: 8 (ref 5–15)
BUN: 17 mg/dL (ref 8–23)
CO2: 29 mmol/L (ref 22–32)
Calcium: 8.2 mg/dL — ABNORMAL LOW (ref 8.9–10.3)
Chloride: 99 mmol/L (ref 98–111)
Creatinine, Ser: 0.53 mg/dL (ref 0.44–1.00)
GFR calc Af Amer: >60 mL/min (ref >60)
GFR calc non Af Amer: >60 mL/min (ref >60)
Glucose, Bld: 97 mg/dL (ref 70–99)
Potassium: 4.3 mmol/L (ref 3.5–5.1)
Sodium: 136 mmol/L (ref 135–145)
Total Bilirubin: 0.5 mg/dL (ref 0.3–1.2)
Total Protein: 5.9 g/dL — ABNORMAL LOW (ref 6.5–8.1)

## 2019-07-24 NOTE — Progress Notes (Signed)
[facility]                                                                              Patient Demographics  Lacey Cook, is a 64 y.o. female, DOB - 1956/04/16, CV:[id number]  Admit date - 07/21/2019   Admitting Physician Eric J [place] DO  Outpatient Primary MD for the patient is Richarda Osmond, DO  Outpatient specialists:   LOS - 3  days   Medical records reviewed and are as summarized below:    Chief Complaint  Patient presents with  . Failure To Thrive       Brief summary   Lacey Cook is a 64 y.o.femalewith medical history significant ofAlzheimer's dementia, depression who presents to the ED with bilateral knee pain associated with swelling, redness, and inability to walk. Patient is accompanied by her daughter, who gives the majority of their HPI due to her underlying dementia. Apparently patient fell 1 week ago, and has since been crawling on her knees. At baseline she is able to ambulate with the use of a walker, but has not been unable to over the past 1 week. Also, patient has not been eating well over the past day. She has severe osteoarthritis, especially of the right hip. Per daughter's report, she is been told that she is not an operative candidate due to her underlying Alzheimer's dementia.  CT head showed no acute intracranial abnormalities, progressive asymmetric atrophy of the left frontal and temporal lobe.  Right hip x-ray with no acute fracture/dislocation with severe degenerative changes of the right hip.  Right and left left knee x-ray with no acute fracture or dislocation.  Patient was admitted for bilateral knee wounds, left knee eschar.  Moderate right knee effusion cellulitis, debility.  Currently awaiting debridement of the eschar of bilateral knee wounds and placement of wound matrix by plastic surgery, scheduled on 3/2  Assessment & Plan    Bilateral knee wounds, cellulitis, present on admission -Patient  presented from home following a fall a week PTA.  She had been crawling on her knees for mobility, now developed significant eschar formation with surrounding cellulitic changes to bilateral knees, right heel blister. -Patient was evaluated by orthopedics, Dr. Sallye Lat, no concern for septic arthritis, recommended IV antibiotics and local wound care -Continue ceftriaxone IV -Plastic surgery, Dr. Marla Roe consulted, plans for surgical debridement on 07/27/2019 at 4 PM -Hold Lovenox 36 hours prior to surgery, wound care following  Mild acute transaminitis -Unclear etiology, resolved, acute hepatitis panel nonreactive, no abdominal symptoms   Chronic right hip pain secondary to advanced osteoarthritis, moderate right knee effusion -X-rays bilateral hips and knees with no acute fracture or dislocation, moderate to severe osteoarthritis with right knee effusion -Left hip CT with no fracture/AVN but with moderate subcutaneous inflammation edema fluid versus hematoma -Seen by orthopedics, Dr. Griffin Basil, no concern for septic arthritis, recommended continue IV antibiotics, local wound care  Generalized weakness, gait disturbance, debility -Daughter reported that usually at baseline able to ambulate with the use of a walker, however fell 1 week PTA, since had been crawling on the knees and unable to ambulate appropriately. -Patient has history of  severe osteoarthritis, had been told by her PCP, that she is not a candidate for operative management due to advanced dementia -Currently lives with daughter.  Daughter has medical issues and unable to care for the patient given higher needs for ADLs. -PT OT evaluation completed, plan to discharge to [facility] once medically cleared  Advanced Alzheimer's dementia -Supportive care, not on any home medications -Continue delirium precautions, melatonin  Depression -Continue Effexor  Pressure wounds PTA Left knee POA, Right knee unstageable,  full-thickness tissue loss, POA Right heel samples blister, POA  Code Status: Full code DVT Prophylaxis:  Lovenox, need to hold 36 hours prior to surgery Family Communication: Discussed all imaging results, lab results, explained to the patient's daughter   Disposition Plan: Patient from home, anticipated disposition to [facility] after surgery and when medically cleared.  Currently on IV antibiotics, pending surgical debridement on 3/2 precludes safe discharge at this time.   Time Spent in minutes 35 minutes  Procedures:  None  Consultants:   Orthopedics, Dr. Griffin Basil Plastic surgery, Dr. Marla Roe  Antimicrobials:   Anti-infectives (From admission, onward)   Start     Dose/Rate Route Frequency Ordered Stop   07/21/19 1800  cefTRIAXone (ROCEPHIN) 1 g in sodium chloride 0.9 % 100 mL IVPB     1 g 200 mL/hr over 30 Minutes Intravenous Daily 07/21/19 1711            Medications  Scheduled Meds: . bisacodyl  10 mg Rectal Once  . enoxaparin (LOVENOX) injection  40 mg Subcutaneous QHS  . feeding supplement (ENSURE ENLIVE)  237 mL Oral BID BM  . Melatonin  3 mg Oral QHS  . multivitamin with minerals  1 tablet Oral Daily  . venlafaxine  75 mg Oral BID   Continuous Infusions: . cefTRIAXone (ROCEPHIN)  IV 1 g (07/24/19 0920)   PRN Meds:.acetaminophen **OR** acetaminophen, albuterol, HYDROcodone-acetaminophen, Muscle Rub, ondansetron **OR** ondansetron (ZOFRAN) IV, polyethylene glycol      Subjective:   Lacey Cook was seen and examined today.  Appears to be comfortable, pleasantly confused.  Has bilateral knee pain and dressing in place on the lower extremities.  Difficult to obtain review of system from the patient due to dementia.  No acute events overnight per nursing staff.    Objective:   Vitals:   07/23/19 1035 07/23/19 1252 07/23/19 2048 07/24/19 0543  BP: 116/88 117/69 (!) 109/58 129/62  Pulse: 84 97 (!) 105 (!) 106  Resp: 18 18 18 18   Temp: 97.7  F (36.5 C) 98.3 F (36.8 C) 98.3 F (36.8 C) 98.2 F (36.8 C)  TempSrc: Oral Oral Oral Oral  SpO2: 95% 95% 95% 94%  Weight:   84.2 kg   Height:   5\' 2"  (1.575 m)     Intake/Output Summary (Last 24 hours) at 07/24/2019 0931 Last data filed at 07/24/2019 0930 Gross per 24 hour  Intake 1177 ml  Output 1350 ml  Net -173 ml     Wt Readings from Last 3 Encounters:  07/23/19 84.2 kg  11/24/18 88.3 kg  12/06/17 81.6 kg     Exam  General: Alert and awake, pleasantly confused  Cardiovascular: S1 S2 auscultated, no murmurs, RRR  Respiratory: Clear to auscultation bilaterally, no wheezing, rales or rhonchi  Gastrointestinal: Soft, nontender, nondistended, + bowel sounds  Ext: no pedal edema bilaterally  Neuro: Does not follow commands  Musculoskeletal: No digital cyanosis, clubbing  Skin: Blister right heel, left knee with eschar and surrounding erythema,  right knee without exudate  Psych: Pleasantly confused  RIGHT KNEE    LEFT KNEE     Right HEEL     Data Reviewed:  I have personally reviewed following labs and imaging studies  Micro Results Recent Results (from the past 240 hour(s))  Urine culture     Status: None   Collection Time: 07/21/19  2:31 PM   Specimen: Urine, Clean Catch  Result Value Ref Range Status   Specimen Description   Final    URINE, CLEAN CATCH Performed at [facility], [place] [address], [place], [place] [address]    Special Requests   Final    NONE Performed at [facility], [place] [address], [place], [place] [address]    Culture   Final    NO GROWTH Performed at [facility], [place] [address] [place], [place] [address]    Report Status 07/23/2019 FINAL  Final  SARS CORONAVIRUS 2 (TAT 6-24 HRS) Nasopharyngeal Nasopharyngeal Swab     Status: None   Collection Time: 07/21/19  5:10 PM   Specimen: Nasopharyngeal Swab  Result Value Ref Range Status   SARS Coronavirus 2 NEGATIVE NEGATIVE  Final    Comment: (NOTE) SARS-CoV-2 target nucleic acids are NOT DETECTED. The SARS-CoV-2 RNA is generally detectable in upper and lower respiratory specimens during the acute phase of infection. Negative results do not preclude SARS-CoV-2 infection, do not rule out co-infections with other pathogens, and should not be used as the sole basis for treatment or other patient management decisions. Negative results must be combined with clinical observations, patient history, and epidemiological information. The expected result is Negative. Fact Sheet for Patients: SugarRoll.be Fact Sheet for Healthcare Providers: https://www.woods-mathews.com/ This test is not yet approved or cleared by the [place] FDA and  has been authorized for detection and/or diagnosis of SARS-CoV-2 by FDA under an Emergency Use Authorization (EUA). This EUA will remain  in effect (meaning this test can be used) for the duration of the COVID-19 declaration under Section 56 4(b)(1) of the Act, 21 U.S.C. section 360bbb-3(b)(1), unless the authorization is terminated or revoked sooner. Performed at [facility], [place] [address], [place],  [address]     Radiology Reports CT Head Wo Contrast  Result Date: 07/21/2019 CLINICAL DATA:  Head trauma. Altered mental status. Failure to thrive. EXAM: CT HEAD WITHOUT CONTRAST TECHNIQUE: Contiguous axial images were obtained from the base of the skull through the vertex without intravenous contrast. COMPARISON:  04/25/2017 FINDINGS: Brain: There is progressive prominent volume loss in the left frontal lobe with milder volume loss in the right frontal lobe. Asymmetric volume loss is also present in the left temporal lobe. Hypodensities in the cerebral white matter bilaterally are similar to the prior study and nonspecific but compatible with mild chronic small vessel ischemic disease. There is no evidence of acute infarct,  intracranial hemorrhage, mass, midline shift, or extra-axial fluid collection. Vascular: Calcified atherosclerosis at the skull base. No hyperdense vessel. Skull: No fracture or suspicious osseous lesion. Sinuses/Orbits: Mild mucosal thickening in the maxillary sinuses. Clear mastoid air cells. Unremarkable orbits. Other: None. IMPRESSION: 1. No evidence of acute intracranial abnormality. 2. Progressive asymmetric atrophy involving the left frontal and temporal lobes. Electronically Signed   By: Logan Bores M.D.   On: 07/21/2019 16:06   CT Hip Left Wo Contrast  Result Date: 07/21/2019 CLINICAL DATA:  Poly trauma. Unable to walk. Left hip pain. EXAM: CT OF THE LEFT HIP WITHOUT  CONTRAST TECHNIQUE: Multidetector CT imaging of the left hip was performed according to the standard protocol. Multiplanar CT image reconstructions were also generated. COMPARISON:  Radiographs 07/21/2019 FINDINGS: The left hip is normally located. No hip fracture or AVN. The acetabulum is intact. No left-sided pelvic fractures are identified. The pubic symphysis and visualized left SI joint are intact. Moderate subcutaneous inflammation/edema/fluid or hematoma likely representing a direct contusion. No obvious intramuscular hematoma to suggest a significant muscle tear. Large amount of stool in the distended rectum. IMPRESSION: 1. No left hip fracture or AVN. 2. Moderate subcutaneous inflammation/edema/fluid or hematoma likely representing a direct contusion. No obvious intramuscular hematoma to suggest a significant muscle tear. 3. Large amount of stool in the distended rectum. Electronically Signed   By: Marijo Sanes M.D.   On: 07/21/2019 17:03   DG Knee Complete 4 Views Left  Result Date: 07/21/2019 CLINICAL DATA:  Pain. EXAM: LEFT KNEE - COMPLETE 4+ VIEW COMPARISON:  None. FINDINGS: There are advanced degenerative changes of the left knee, greatest within the medial and patellofemoral compartments. There is no acute displaced  fracture. No dislocation. There is a small suprapatellar joint effusion. IMPRESSION: 1. No acute displaced fracture or dislocation. 2. Advanced degenerative changes of the left knee. Electronically Signed   By: Constance Holster M.D.   On: 07/21/2019 16:07   DG Knee Complete 4 Views Right  Result Date: 07/21/2019 CLINICAL DATA:  Pain EXAM: RIGHT KNEE - COMPLETE 4+ VIEW COMPARISON:  None. FINDINGS: There are advanced degenerative changes of the right knee, greatest within the medial and patellofemoral compartments. There is no acute displaced fracture. No dislocation. There is a small to moderate-sized joint effusion. IMPRESSION: 1. No acute displaced fracture or dislocation. 2. Small to moderate-sized joint effusion. 3. Advanced degenerative changes. Electronically Signed   By: Constance Holster M.D.   On: 07/21/2019 16:10   DG Hip Unilat With Pelvis 2-3 Views Left  Result Date: 07/21/2019 CLINICAL DATA:  Pain status post fall EXAM: DG HIP (WITH OR WITHOUT PELVIS) 2-3V LEFT COMPARISON:  None. FINDINGS: There are end-stage degenerative changes of the right hip. There is mild-to-moderate osteoarthritis of the left hip. No acute displaced fracture or dislocation. There is a moderate to large amount of stool at the level of the rectum. IMPRESSION: 1. No acute displaced fracture or dislocation. 2. Severe degenerative changes of the right hip. 3. Mild-to-moderate osteoarthritis of the left hip. Electronically Signed   By: Constance Holster M.D.   On: 07/21/2019 16:07    Lab Data:  CBC: Recent Labs  Lab 07/21/19 1432 07/22/19 0316 07/23/19 0353 07/24/19 0354  WBC 8.1 6.2 6.1 8.0  HGB 13.6 12.5 11.5* 12.2  HCT 40.3 38.1 34.9* 36.2  MCV 95.7 98.4 98.9 98.4  PLT 210 173 197 [id number]   Basic Metabolic Panel: Recent Labs  Lab 07/21/19 1425 07/22/19 0316 07/23/19 0353 07/24/19 0354  NA 140 139 138 136  K 3.5 3.4* 4.0 4.3  CL 104 105 104 99  CO2 28 24 26 29   GLUCOSE 123* 113* 134* 97  BUN 17 19  20 17   CREATININE 0.58 0.52 0.52 0.53  CALCIUM 8.3* 8.1* 8.0* 8.2*  MG  --  2.1  --   --    GFR: Estimated Creatinine Clearance: 73.3 mL/min (by C-G formula based on SCr of 0.53 mg/dL). Liver Function Tests: Recent Labs  Lab 07/21/19 1425 07/22/19 0316 07/23/19 0353 07/24/19 0354  AST 75* 58* 38 38  ALT 25 25 22  23  ALKPHOS 57 54 50 53  BILITOT 1.4* 1.2 0.5 0.5  PROT 6.3* 6.0* 5.8* 5.9*  ALBUMIN 2.9* 2.7* 2.7* 2.6*   No results for input(s): LIPASE, AMYLASE in the last 168 hours. No results for input(s): AMMONIA in the last 168 hours. Coagulation Profile: No results for input(s): INR, PROTIME in the last 168 hours. Cardiac Enzymes: Recent Labs  Lab 07/22/19 0316  CKTOTAL 760*   BNP (last 3 results) No results for input(s): PROBNP in the last 8760 hours. HbA1C: No results for input(s): HGBA1C in the last 72 hours. CBG: Recent Labs  Lab 07/21/19 2134  GLUCAP 110*   Lipid Profile: No results for input(s): CHOL, HDL, LDLCALC, TRIG, CHOLHDL, LDLDIRECT in the last 72 hours. Thyroid Function Tests: Recent Labs    07/21/19 1958  TSH 2.995   Anemia Panel: No results for input(s): VITAMINB12, FOLATE, FERRITIN, TIBC, IRON, RETICCTPCT in the last 72 hours. Urine analysis:    Component Value Date/Time   COLORURINE YELLOW 07/21/2019 [place] 07/21/2019 1431   LABSPEC 1.010 07/21/2019 1431   PHURINE 6.0 07/21/2019 1431   GLUCOSEU NEGATIVE 07/21/2019 1431   HGBUR NEGATIVE 07/21/2019 1431   BILIRUBINUR NEGATIVE 07/21/2019 1431   KETONESUR NEGATIVE 07/21/2019 1431   PROTEINUR NEGATIVE 07/21/2019 1431   NITRITE NEGATIVE 07/21/2019 1431   LEUKOCYTESUR NEGATIVE 07/21/2019 1431     Lacey Cook M.D. [facility] 07/24/2019, 9:31 AM   Call night coverage person covering after 7pm

## 2019-07-25 MED ORDER — SODIUM CHLORIDE 0.9 % IV BOLUS
500.0000 mL | Freq: Once | INTRAVENOUS | Status: AC
  Administered 2019-07-25: 500 mL via INTRAVENOUS

## 2019-07-25 NOTE — Progress Notes (Signed)
[facility]                                                                              Patient Demographics  Lacey Cook, is a 64 y.o. female, DOB - 1956/01/17, [id number]  Admit date - 07/21/2019   Admitting Physician Eric J [place] DO  Outpatient Primary MD for the patient is Richarda Osmond, DO  Outpatient specialists:   LOS - 4  days   Medical records reviewed and are as summarized below:    Chief Complaint  Patient presents with  . Failure To Thrive       Brief summary   Lacey Cook is a 64 y.o.femalewith medical history significant ofAlzheimer's dementia, depression who presents to the ED with bilateral knee pain associated with swelling, redness, and inability to walk. Patient is accompanied by her daughter, who gives the majority of their HPI due to her underlying dementia. Apparently patient fell 1 week ago, and has since been crawling on her knees. At baseline she is able to ambulate with the use of a walker, but has not been unable to over the past 1 week. Also, patient has not been eating well over the past day. She has severe osteoarthritis, especially of the right hip. Per daughter's report, she is been told that she is not an operative candidate due to her underlying Alzheimer's dementia.  CT head showed no acute intracranial abnormalities, progressive asymmetric atrophy of the left frontal and temporal lobe.  Right hip x-ray with no acute fracture/dislocation with severe degenerative changes of the right hip.  Right and left left knee x-ray with no acute fracture or dislocation.  Patient was admitted for bilateral knee wounds, left knee eschar.  Moderate right knee effusion cellulitis, debility.  Currently awaiting debridement of the eschar of bilateral knee wounds and placement of wound matrix by plastic surgery, scheduled on 3/2  Assessment & Plan    Bilateral knee wounds, cellulitis, present on admission -Patient  presented from home following a fall a week PTA.  She had been crawling on her knees for mobility, now developed significant eschar formation with surrounding cellulitic changes to bilateral knees, right heel blister. -Patient was evaluated by orthopedics, Dr. Sallye Lat, no concern for septic arthritis, recommended IV antibiotics and local wound care -Continue ceftriaxone IV -Plastic surgery, Dr. Marla Roe consulted, plans for surgical debridement on 07/27/2019 at 4 PM -Wound care following, will hold Lovenox tomorrow  Mild acute transaminitis -Unclear etiology, resolved, acute hepatitis panel nonreactive, no abdominal symptoms  Chronic right hip pain secondary to advanced osteoarthritis, moderate right knee effusion -X-rays bilateral hips and knees with no acute fracture or dislocation, moderate to severe osteoarthritis with right knee effusion -Left hip CT with no fracture/AVN but with moderate subcutaneous inflammation edema fluid versus hematoma -Seen by orthopedics, Dr. Griffin Basil, no concern for septic arthritis, recommended continue IV antibiotics, local wound care  Generalized weakness, gait disturbance, debility -Daughter reported that usually at baseline able to ambulate with the use of a walker, however fell 1 week PTA, since had been crawling on the knees and unable to ambulate appropriately. -Patient has history of severe osteoarthritis, had been  told by her PCP, that she is not a candidate for operative management due to advanced dementia -Currently lives with daughter.  Daughter has medical issues and unable to care for the patient given higher needs for ADLs. -PT OT evaluation completed, plan to discharge to [facility] once medically cleared  Advanced Alzheimer's dementia -Supportive care, not on any home medications -Continue delirium precautions, melatonin  Depression -Continue Effexor  Pressure wounds PTA Left knee POA, Right knee unstageable, full-thickness tissue  loss, POA Right heel samples blister, POA  Code Status: Full code DVT Prophylaxis:  Lovenox, need to hold 36 hours prior to surgery Family Communication: Discussed all imaging results, lab results, explained to the patient's daughter on the phone   Disposition Plan: Patient from home, anticipated disposition to [facility] after surgery and when medically cleared.  Currently on IV antibiotics, pending surgical debridement on 3/2 precludes safe discharge at this time.   Time Spent in minutes 20 minutes  Procedures:  None  Consultants:   Orthopedics, Dr. Griffin Basil Plastic surgery, Dr. Marla Roe  Antimicrobials:   Anti-infectives (From admission, onward)   Start     Dose/Rate Route Frequency Ordered Stop   07/21/19 1800  cefTRIAXone (ROCEPHIN) 1 g in sodium chloride 0.9 % 100 mL IVPB     1 g 200 mL/hr over 30 Minutes Intravenous Daily 07/21/19 1711           Medications  Scheduled Meds: . bisacodyl  10 mg Rectal Once  . enoxaparin (LOVENOX) injection  40 mg Subcutaneous QHS  . feeding supplement (ENSURE ENLIVE)  237 mL Oral BID BM  . Melatonin  3 mg Oral QHS  . multivitamin with minerals  1 tablet Oral Daily  . venlafaxine  75 mg Oral BID   Continuous Infusions: . cefTRIAXone (ROCEPHIN)  IV 1 g (07/25/19 0913)   PRN Meds:.acetaminophen **OR** acetaminophen, albuterol, HYDROcodone-acetaminophen, Muscle Rub, ondansetron **OR** ondansetron (ZOFRAN) IV, polyethylene glycol      Subjective:   Mera Ortlieb was seen and examined today.  Appears comfortable, pleasant.  No acute complaints watching TV.  Difficult to obtain review of system from the patient due to dementia.  No acute events overnight per nursing staff Objective:   Vitals:   07/24/19 1400 07/24/19 1516 07/24/19 2134 07/25/19 0554  BP: 109/62  135/65 136/83  Pulse: (!) 109  (!) 101 (!) 102  Resp: 18  18 16   Temp: 100.2 F (37.9 C)  97.9 F (36.6 C) 98.7 F (37.1 C)  TempSrc: Oral  Oral Oral    SpO2: 95% 92% 92% 92%  Weight:      Height:        Intake/Output Summary (Last 24 hours) at 07/25/2019 1152 Last data filed at 07/25/2019 1000 Gross per 24 hour  Intake 580 ml  Output 675 ml  Net -95 ml     Wt Readings from Last 3 Encounters:  07/23/19 84.2 kg  11/24/18 88.3 kg  12/06/17 81.6 kg   Physical Exam  General: Alert and awake, pleasantly confused, dementia  Cardiovascular: S1 S2 clear, RRR. No pedal edema b/l  Respiratory: CTAB, no wheezing, rales or rhonchi  Gastrointestinal: Soft, nontender, nondistended, NBS  Ext: no pedal edema bilaterally  Neuro: no new deficits  Musculoskeletal: No cyanosis, clubbing  Skin: Bilateral knee eschars, blister right heel  Psych: Pleasantly confused   RIGHT KNEE    LEFT KNEE     Right HEEL     Data Reviewed:  I have personally reviewed  following labs and imaging studies  Micro Results Recent Results (from the past 240 hour(s))  Urine culture     Status: None   Collection Time: 07/21/19  2:31 PM   Specimen: Urine, Clean Catch  Result Value Ref Range Status   Specimen Description   Final    URINE, CLEAN CATCH Performed at [facility], [place] [address], [place], [place] [address]    Special Requests   Final    NONE Performed at [facility], [place] [address] [place], [place] [address]    Culture   Final    NO GROWTH Performed at [facility], [place] [address] [place], [place] [address]    Report Status 07/23/2019 FINAL  Final  SARS CORONAVIRUS 2 (TAT 6-24 HRS) Nasopharyngeal Nasopharyngeal Swab     Status: None   Collection Time: 07/21/19  5:10 PM   Specimen: Nasopharyngeal Swab  Result Value Ref Range Status   SARS Coronavirus 2 NEGATIVE NEGATIVE Final    Comment: (NOTE) SARS-CoV-2 target nucleic acids are NOT DETECTED. The SARS-CoV-2 RNA is generally detectable in upper and lower respiratory specimens during the acute phase of infection.  Negative results do not preclude SARS-CoV-2 infection, do not rule out co-infections with other pathogens, and should not be used as the sole basis for treatment or other patient management decisions. Negative results must be combined with clinical observations, patient history, and epidemiological information. The expected result is Negative. Fact Sheet for Patients: SugarRoll.be Fact Sheet for Healthcare Providers: https://www.woods-mathews.com/ This test is not yet approved or cleared by the [place] FDA and  has been authorized for detection and/or diagnosis of SARS-CoV-2 by FDA under an Emergency Use Authorization (EUA). This EUA will remain  in effect (meaning this test can be used) for the duration of the COVID-19 declaration under Section 56 4(b)(1) of the Act, 21 U.S.C. section 360bbb-3(b)(1), unless the authorization is terminated or revoked sooner. Performed at [facility], [place] [address], [place], [place] [address]     Radiology Reports CT Head Wo Contrast  Result Date: 07/21/2019 CLINICAL DATA:  Head trauma. Altered mental status. Failure to thrive. EXAM: CT HEAD WITHOUT CONTRAST TECHNIQUE: Contiguous axial images were obtained from the base of the skull through the vertex without intravenous contrast. COMPARISON:  04/25/2017 FINDINGS: Brain: There is progressive prominent volume loss in the left frontal lobe with milder volume loss in the right frontal lobe. Asymmetric volume loss is also present in the left temporal lobe. Hypodensities in the cerebral white matter bilaterally are similar to the prior study and nonspecific but compatible with mild chronic small vessel ischemic disease. There is no evidence of acute infarct, intracranial hemorrhage, mass, midline shift, or extra-axial fluid collection. Vascular: Calcified atherosclerosis at the skull base. No hyperdense vessel. Skull: No fracture or suspicious osseous lesion.  Sinuses/Orbits: Mild mucosal thickening in the maxillary sinuses. Clear mastoid air cells. Unremarkable orbits. Other: None. IMPRESSION: 1. No evidence of acute intracranial abnormality. 2. Progressive asymmetric atrophy involving the left frontal and temporal lobes. Electronically Signed   By: Logan Bores M.D.   On: 07/21/2019 16:06   CT Hip Left Wo Contrast  Result Date: 07/21/2019 CLINICAL DATA:  Poly trauma. Unable to walk. Left hip pain. EXAM: CT OF THE LEFT HIP WITHOUT CONTRAST TECHNIQUE: Multidetector CT imaging of the left hip was performed according to the standard protocol. Multiplanar CT image reconstructions were also generated. COMPARISON:  Radiographs 07/21/2019 FINDINGS: The left hip is normally  located. No hip fracture or AVN. The acetabulum is intact. No left-sided pelvic fractures are identified. The pubic symphysis and visualized left SI joint are intact. Moderate subcutaneous inflammation/edema/fluid or hematoma likely representing a direct contusion. No obvious intramuscular hematoma to suggest a significant muscle tear. Large amount of stool in the distended rectum. IMPRESSION: 1. No left hip fracture or AVN. 2. Moderate subcutaneous inflammation/edema/fluid or hematoma likely representing a direct contusion. No obvious intramuscular hematoma to suggest a significant muscle tear. 3. Large amount of stool in the distended rectum. Electronically Signed   By: Marijo Sanes M.D.   On: 07/21/2019 17:03   DG Knee Complete 4 Views Left  Result Date: 07/21/2019 CLINICAL DATA:  Pain. EXAM: LEFT KNEE - COMPLETE 4+ VIEW COMPARISON:  None. FINDINGS: There are advanced degenerative changes of the left knee, greatest within the medial and patellofemoral compartments. There is no acute displaced fracture. No dislocation. There is a small suprapatellar joint effusion. IMPRESSION: 1. No acute displaced fracture or dislocation. 2. Advanced degenerative changes of the left knee. Electronically Signed    By: Constance Holster M.D.   On: 07/21/2019 16:07   DG Knee Complete 4 Views Right  Result Date: 07/21/2019 CLINICAL DATA:  Pain EXAM: RIGHT KNEE - COMPLETE 4+ VIEW COMPARISON:  None. FINDINGS: There are advanced degenerative changes of the right knee, greatest within the medial and patellofemoral compartments. There is no acute displaced fracture. No dislocation. There is a small to moderate-sized joint effusion. IMPRESSION: 1. No acute displaced fracture or dislocation. 2. Small to moderate-sized joint effusion. 3. Advanced degenerative changes. Electronically Signed   By: Constance Holster M.D.   On: 07/21/2019 16:10   DG Hip Unilat With Pelvis 2-3 Views Left  Result Date: 07/21/2019 CLINICAL DATA:  Pain status post fall EXAM: DG HIP (WITH OR WITHOUT PELVIS) 2-3V LEFT COMPARISON:  None. FINDINGS: There are end-stage degenerative changes of the right hip. There is mild-to-moderate osteoarthritis of the left hip. No acute displaced fracture or dislocation. There is a moderate to large amount of stool at the level of the rectum. IMPRESSION: 1. No acute displaced fracture or dislocation. 2. Severe degenerative changes of the right hip. 3. Mild-to-moderate osteoarthritis of the left hip. Electronically Signed   By: Constance Holster M.D.   On: 07/21/2019 16:07    Lab Data:  CBC: Recent Labs  Lab 07/21/19 1432 07/22/19 0316 07/23/19 0353 07/24/19 0354  WBC 8.1 6.2 6.1 8.0  HGB 13.6 12.5 11.5* 12.2  HCT 40.3 38.1 34.9* 36.2  MCV 95.7 98.4 98.9 98.4  PLT 210 173 197 [id number]   Basic Metabolic Panel: Recent Labs  Lab 07/21/19 1425 07/22/19 0316 07/23/19 0353 07/24/19 0354  NA 140 139 138 136  K 3.5 3.4* 4.0 4.3  CL 104 105 104 99  CO2 28 24 26 29   GLUCOSE 123* 113* 134* 97  BUN 17 19 20 17   CREATININE 0.58 0.52 0.52 0.53  CALCIUM 8.3* 8.1* 8.0* 8.2*  MG  --  2.1  --   --    GFR: Estimated Creatinine Clearance: 73.3 mL/min (by C-G formula based on SCr of 0.53 mg/dL). Liver  Function Tests: Recent Labs  Lab 07/21/19 1425 07/22/19 0316 07/23/19 0353 07/24/19 0354  AST 75* 58* 38 38  ALT 25 25 22 23   ALKPHOS 57 54 50 53  BILITOT 1.4* 1.2 0.5 0.5  PROT 6.3* 6.0* 5.8* 5.9*  ALBUMIN 2.9* 2.7* 2.7* 2.6*   No results for input(s): LIPASE, AMYLASE in the  last 168 hours. No results for input(s): AMMONIA in the last 168 hours. Coagulation Profile: No results for input(s): INR, PROTIME in the last 168 hours. Cardiac Enzymes: Recent Labs  Lab 07/22/19 0316  CKTOTAL 760*   BNP (last 3 results) No results for input(s): PROBNP in the last 8760 hours. HbA1C: No results for input(s): HGBA1C in the last 72 hours. CBG: Recent Labs  Lab 07/21/19 2134  GLUCAP 110*   Lipid Profile: No results for input(s): CHOL, HDL, LDLCALC, TRIG, CHOLHDL, LDLDIRECT in the last 72 hours. Thyroid Function Tests: No results for input(s): TSH, T4TOTAL, FREET4, T3FREE, THYROIDAB in the last 72 hours. Anemia Panel: No results for input(s): VITAMINB12, FOLATE, FERRITIN, TIBC, IRON, RETICCTPCT in the last 72 hours. Urine analysis:    Component Value Date/Time   COLORURINE YELLOW 07/21/2019 [place] 07/21/2019 1431   LABSPEC 1.010 07/21/2019 1431   PHURINE 6.0 07/21/2019 1431   GLUCOSEU NEGATIVE 07/21/2019 1431   HGBUR NEGATIVE 07/21/2019 1431   BILIRUBINUR NEGATIVE 07/21/2019 1431   KETONESUR NEGATIVE 07/21/2019 1431   PROTEINUR NEGATIVE 07/21/2019 1431   NITRITE NEGATIVE 07/21/2019 1431   LEUKOCYTESUR NEGATIVE 07/21/2019 1431     Lynden Carrithers M.D. [facility] 07/25/2019, 11:52 AM   Call night coverage person covering after 7pm

## 2019-07-26 DIAGNOSIS — R7989 Other specified abnormal findings of blood chemistry: Secondary | ICD-10-CM

## 2019-07-26 LAB — CBC
HCT: 32.9 % — ABNORMAL LOW (ref 36.0–46.0)
Hemoglobin: 11 g/dL — ABNORMAL LOW (ref 12.0–15.0)
MCH: 33 pg (ref 26.0–34.0)
MCHC: 33.4 g/dL (ref 30.0–36.0)
MCV: 98.8 fL (ref 80.0–100.0)
Platelets: 231 10*3/uL (ref 150–400)
RBC: 3.33 MIL/uL — ABNORMAL LOW (ref 3.87–5.11)
RDW: 12.8 % (ref 11.5–15.5)
WBC: 7.9 10*3/uL (ref 4.0–10.5)
nRBC: 0 % (ref 0.0–0.2)

## 2019-07-26 LAB — BASIC METABOLIC PANEL
Anion gap: 7 (ref 5–15)
BUN: 18 mg/dL (ref 8–23)
CO2: 29 mmol/L (ref 22–32)
Calcium: 8 mg/dL — ABNORMAL LOW (ref 8.9–10.3)
Chloride: 99 mmol/L (ref 98–111)
Creatinine, Ser: 0.49 mg/dL (ref 0.44–1.00)
GFR calc Af Amer: >60 mL/min (ref >60)
GFR calc non Af Amer: >60 mL/min (ref >60)
Glucose, Bld: 114 mg/dL — ABNORMAL HIGH (ref 70–99)
Potassium: 3.9 mmol/L (ref 3.5–5.1)
Sodium: 135 mmol/L (ref 135–145)

## 2019-07-26 MED ORDER — SODIUM CHLORIDE 0.9 % IV SOLN
INTRAVENOUS | Status: DC | PRN
  Administered 2019-07-28: 500 mL via INTRAVENOUS

## 2019-07-26 NOTE — Progress Notes (Addendum)
[facility]                                                                              Patient Demographics  Lacey Cook, is a 64 y.o. female, DOB - [birth year], [id number]  Admit date - 07/21/2019   Admitting Physician Eric J [place] DO  Outpatient Primary MD for the patient is Richarda Osmond, DO  Outpatient specialists:   LOS - 5  days   Medical records reviewed and are as summarized below:    Chief Complaint  Patient presents with  . Failure To Thrive       Brief summary   Lacey Cook is a 64 y.o.femalewith medical history significant ofAlzheimer's dementia, depression who presents to the ED with bilateral knee pain associated with swelling, redness, and inability to walk. Patient is accompanied by her daughter, who gives the majority of their HPI due to her underlying dementia. Apparently patient fell 1 week ago, and has since been crawling on her knees. At baseline she is able to ambulate with the use of a walker, but has not been unable to over the past 1 week. Also, patient has not been eating well over the past day. She has severe osteoarthritis, especially of the right hip. Per daughter's report, she is been told that she is not an operative candidate due to her underlying Alzheimer's dementia.  CT head showed no acute intracranial abnormalities, progressive asymmetric atrophy of the left frontal and temporal lobe.  Right hip x-ray with no acute fracture/dislocation with severe degenerative changes of the right hip.  Right and left left knee x-ray with no acute fracture or dislocation.  Patient was admitted for bilateral knee wounds, left knee eschar.  Moderate right knee effusion cellulitis, debility.  Currently awaiting debridement of the eschar of bilateral knee wounds and placement of wound matrix by plastic surgery, scheduled on 3/2  Assessment & Plan    Bilateral knee wounds, cellulitis, present on admission -Patient  presented from home following a fall a week PTA.  She had been crawling on her knees for mobility, now developed significant eschar formation with surrounding cellulitic changes to bilateral knees, right heel blister. -Patient was evaluated by orthopedics, Dr. Sallye Lat, no concern for septic arthritis, recommended IV antibiotics and local wound care -Continue ceftriaxone IV -Plastic surgery, Dr. Marla Roe consulted -  hold Lovenox, plan for surgical debridement on 3/25 at 4pm.  N.p.o. after midnight  Mild acute transaminitis -Unclear etiology, resolved, acute hepatitis panel nonreactive, no abdominal symptoms  Chronic right hip pain secondary to advanced osteoarthritis, moderate right knee effusion -X-rays bilateral hips and knees with no acute fracture or dislocation, moderate to severe osteoarthritis with right knee effusion -Left hip CT with no fracture/AVN but with moderate subcutaneous inflammation edema fluid versus hematoma -Seen by orthopedics, Dr. Griffin Basil, no concern for septic arthritis, recommended continue IV antibiotics, local wound care -No acute issues  Generalized weakness, gait disturbance, debility -Daughter reported that usually at baseline able to ambulate with the use of a walker, however fell 1 week PTA, since had been crawling on the knees and unable to ambulate appropriately. -Patient has history of severe  osteoarthritis, had been told by her PCP, that she is not a candidate for operative management due to advanced dementia -Currently lives with daughter.  Daughter has medical issues and unable to care for the patient given higher needs for ADLs. -PT OT evaluation completed, plan to discharge to [facility] once medically cleared  Advanced Alzheimer's dementia -Supportive care, not on any home medications -Continue delirium precautions, melatonin -Much more awake  Depression -Continue Effexor  Pressure wounds PTA Left knee POA, Right knee unstageable,  full-thickness tissue loss, POA Right heel samples blister, POA  Code Status: Full code DVT Prophylaxis: Holding Lovenox, will place SCDs Family Communication: Discussed all imaging results, lab results, explained to the patient's daughter on the phone on 2/28   Disposition Plan: Patient from home, anticipated disposition to [facility] after surgery and when medically cleared.  Currently on IV antibiotics, pending surgical debridement on 3/2 precludes safe discharge at this time.   Time Spent in minutes 20 minutes  Procedures:  None  Consultants:   Orthopedics, Dr. Griffin Basil Plastic surgery, Dr. Marla Roe  Antimicrobials:   Anti-infectives (From admission, onward)   Start     Dose/Rate Route Frequency Ordered Stop   07/21/19 1800  cefTRIAXone (ROCEPHIN) 1 g in sodium chloride 0.9 % 100 mL IVPB     1 g 200 mL/hr over 30 Minutes Intravenous Daily 07/21/19 1711           Medications  Scheduled Meds: . bisacodyl  10 mg Rectal Once  . feeding supplement (ENSURE ENLIVE)  237 mL Oral BID BM  . Melatonin  3 mg Oral QHS  . multivitamin with minerals  1 tablet Oral Daily  . venlafaxine  75 mg Oral BID   Continuous Infusions: . cefTRIAXone (ROCEPHIN)  IV 1 g (07/26/19 0938)   PRN Meds:.acetaminophen **OR** acetaminophen, albuterol, HYDROcodone-acetaminophen, Muscle Rub, ondansetron **OR** ondansetron (ZOFRAN) IV, polyethylene glycol      Subjective:   [name] was seen and examined today.  Appears pleasant and comfortable, eating breakfast without any difficulty.  Poor historian, difficult to obtain review of system from the patient due to dementia no acute issues overnight per nursing staff.  No fevers or chills.  Objective:   Vitals:   07/26/19 0217 07/26/19 0613 07/26/19 1013 07/26/19 1129  BP: 110/67 127/77 (!) 135/95 120/76  Pulse: (!) 114 (!) 103 (!) 110 (!) 108  Resp: 20 18 18 20   Temp: 99.7 F (37.6 C) 99.6 F (37.6 C) 98.4 F (36.9 C) 98.2 F  (36.8 C)  TempSrc: Oral Oral Oral Oral  SpO2: 92% 92% 94% 94%  Weight:      Height:        Intake/Output Summary (Last 24 hours) at 07/26/2019 1156 Last data filed at 07/26/2019 0929 Gross per 24 hour  Intake 1244.43 ml  Output 600 ml  Net 644.43 ml     Wt Readings from Last 3 Encounters:  07/23/19 84.2 kg  11/24/18 88.3 kg  12/06/17 81.6 kg    Physical Exam  General: Alert and oriented x self, pleasantly confused, dementia  Cardiovascular: S1 S2 clear, RRR. No pedal edema b/l  Respiratory: CTAB, no wheezing, rales or rhonchi  Gastrointestinal: Soft, nontender, nondistended, NBS  Ext: no pedal edema bilaterally  Neuro: no new deficits  Musculoskeletal: No cyanosis, clubbing  Skin: Bilateral knee eschars, blister right heel  Psych: Dementia     RIGHT KNEE    LEFT KNEE     Right HEEL  Data Reviewed:  I have personally reviewed following labs and imaging studies  Micro Results Recent Results (from the past 240 hour(s))  Urine culture     Status: None   Collection Time: 07/21/19  2:31 PM   Specimen: Urine, Clean Catch  Result Value Ref Range Status   Specimen Description   Final    URINE, CLEAN CATCH Performed at [facility] [address] [place], [place] [address]    Special Requests   Final    NONE Performed at [facility], [place] [address] [place], [place] [address]    Culture   Final    NO GROWTH Performed at [facility], [place] [address], [place], [place] [address]    Report Status 07/23/2019 FINAL  Final  SARS CORONAVIRUS 2 (TAT 6-24 HRS) Nasopharyngeal Nasopharyngeal Swab     Status: None   Collection Time: 07/21/19  5:10 PM   Specimen: Nasopharyngeal Swab  Result Value Ref Range Status   SARS Coronavirus 2 NEGATIVE NEGATIVE Final    Comment: (NOTE) SARS-CoV-2 target nucleic acids are NOT DETECTED. The SARS-CoV-2 RNA is generally detectable in upper and lower respiratory specimens  during the acute phase of infection. Negative results do not preclude SARS-CoV-2 infection, do not rule out co-infections with other pathogens, and should not be used as the sole basis for treatment or other patient management decisions. Negative results must be combined with clinical observations, patient history, and epidemiological information. The expected result is Negative. Fact Sheet for Patients: SugarRoll.be Fact Sheet for Healthcare Providers: https://www.woods-mathews.com/ This test is not yet approved or cleared by the [place] FDA and  has been authorized for detection and/or diagnosis of SARS-CoV-2 by FDA under an Emergency Use Authorization (EUA). This EUA will remain  in effect (meaning this test can be used) for the duration of the COVID-19 declaration under Section 56 4(b)(1) of the Act, 21 U.S.C. section 360bbb-3(b)(1), unless the authorization is terminated or revoked sooner. Performed at [facility], [place] [address] [place],  [address]     Radiology Reports CT Head Wo Contrast  Result Date: 07/21/2019 CLINICAL DATA:  Head trauma. Altered mental status. Failure to thrive. EXAM: CT HEAD WITHOUT CONTRAST TECHNIQUE: Contiguous axial images were obtained from the base of the skull through the vertex without intravenous contrast. COMPARISON:  04/25/2017 FINDINGS: Brain: There is progressive prominent volume loss in the left frontal lobe with milder volume loss in the right frontal lobe. Asymmetric volume loss is also present in the left temporal lobe. Hypodensities in the cerebral white matter bilaterally are similar to the prior study and nonspecific but compatible with mild chronic small vessel ischemic disease. There is no evidence of acute infarct, intracranial hemorrhage, mass, midline shift, or extra-axial fluid collection. Vascular: Calcified atherosclerosis at the skull base. No hyperdense vessel. Skull: No  fracture or suspicious osseous lesion. Sinuses/Orbits: Mild mucosal thickening in the maxillary sinuses. Clear mastoid air cells. Unremarkable orbits. Other: None. IMPRESSION: 1. No evidence of acute intracranial abnormality. 2. Progressive asymmetric atrophy involving the left frontal and temporal lobes. Electronically Signed   By: Logan Bores M.D.   On: 07/21/2019 16:06   CT Hip Left Wo Contrast  Result Date: 07/21/2019 CLINICAL DATA:  Poly trauma. Unable to walk. Left hip pain. EXAM: CT OF THE LEFT HIP WITHOUT CONTRAST TECHNIQUE: Multidetector CT imaging of the left hip was performed according to the standard protocol. Multiplanar CT image reconstructions were also generated. COMPARISON:  Radiographs  07/21/2019 FINDINGS: The left hip is normally located. No hip fracture or AVN. The acetabulum is intact. No left-sided pelvic fractures are identified. The pubic symphysis and visualized left SI joint are intact. Moderate subcutaneous inflammation/edema/fluid or hematoma likely representing a direct contusion. No obvious intramuscular hematoma to suggest a significant muscle tear. Large amount of stool in the distended rectum. IMPRESSION: 1. No left hip fracture or AVN. 2. Moderate subcutaneous inflammation/edema/fluid or hematoma likely representing a direct contusion. No obvious intramuscular hematoma to suggest a significant muscle tear. 3. Large amount of stool in the distended rectum. Electronically Signed   By: Marijo Sanes M.D.   On: 07/21/2019 17:03   DG Knee Complete 4 Views Left  Result Date: 07/21/2019 CLINICAL DATA:  Pain. EXAM: LEFT KNEE - COMPLETE 4+ VIEW COMPARISON:  None. FINDINGS: There are advanced degenerative changes of the left knee, greatest within the medial and patellofemoral compartments. There is no acute displaced fracture. No dislocation. There is a small suprapatellar joint effusion. IMPRESSION: 1. No acute displaced fracture or dislocation. 2. Advanced degenerative changes of  the left knee. Electronically Signed   By: Constance Holster M.D.   On: 07/21/2019 16:07   DG Knee Complete 4 Views Right  Result Date: 07/21/2019 CLINICAL DATA:  Pain EXAM: RIGHT KNEE - COMPLETE 4+ VIEW COMPARISON:  None. FINDINGS: There are advanced degenerative changes of the right knee, greatest within the medial and patellofemoral compartments. There is no acute displaced fracture. No dislocation. There is a small to moderate-sized joint effusion. IMPRESSION: 1. No acute displaced fracture or dislocation. 2. Small to moderate-sized joint effusion. 3. Advanced degenerative changes. Electronically Signed   By: Constance Holster M.D.   On: 07/21/2019 16:10   DG Hip Unilat With Pelvis 2-3 Views Left  Result Date: 07/21/2019 CLINICAL DATA:  Pain status post fall EXAM: DG HIP (WITH OR WITHOUT PELVIS) 2-3V LEFT COMPARISON:  None. FINDINGS: There are end-stage degenerative changes of the right hip. There is mild-to-moderate osteoarthritis of the left hip. No acute displaced fracture or dislocation. There is a moderate to large amount of stool at the level of the rectum. IMPRESSION: 1. No acute displaced fracture or dislocation. 2. Severe degenerative changes of the right hip. 3. Mild-to-moderate osteoarthritis of the left hip. Electronically Signed   By: Constance Holster M.D.   On: 07/21/2019 16:07    Lab Data:  CBC: Recent Labs  Lab 07/21/19 1432 07/22/19 0316 07/23/19 0353 07/24/19 0354 07/26/19 0437  WBC 8.1 6.2 6.1 8.0 7.9  HGB 13.6 12.5 11.5* 12.2 11.0*  HCT 40.3 38.1 34.9* 36.2 32.9*  MCV 95.7 98.4 98.9 98.4 98.8  PLT 210 173 197 228 [id number]   Basic Metabolic Panel: Recent Labs  Lab 07/21/19 1425 07/22/19 0316 07/23/19 0353 07/24/19 0354 07/26/19 0437  NA 140 139 138 136 135  K 3.5 3.4* 4.0 4.3 3.9  CL 104 105 104 99 99  CO2 28 24 26 29 29   GLUCOSE 123* 113* 134* 97 114*  BUN 17 19 20 17 18   CREATININE 0.58 0.52 0.52 0.53 0.49  CALCIUM 8.3* 8.1* 8.0* 8.2* 8.0*  MG  --   2.1  --   --   --    GFR: Estimated Creatinine Clearance: 73.3 mL/min (by C-G formula based on SCr of 0.49 mg/dL). Liver Function Tests: Recent Labs  Lab 07/21/19 1425 07/22/19 0316 07/23/19 0353 07/24/19 0354  AST 75* 58* 38 38  ALT 25 25 22 23   ALKPHOS 57 54 50 53  BILITOT 1.4* 1.2 0.5 0.5  PROT 6.3* 6.0* 5.8* 5.9*  ALBUMIN 2.9* 2.7* 2.7* 2.6*   No results for input(s): LIPASE, AMYLASE in the last 168 hours. No results for input(s): AMMONIA in the last 168 hours. Coagulation Profile: No results for input(s): INR, PROTIME in the last 168 hours. Cardiac Enzymes: Recent Labs  Lab 07/22/19 0316  CKTOTAL 760*   BNP (last 3 results) No results for input(s): PROBNP in the last 8760 hours. HbA1C: No results for input(s): HGBA1C in the last 72 hours. CBG: Recent Labs  Lab 07/21/19 2134  GLUCAP 110*   Lipid Profile: No results for input(s): CHOL, HDL, LDLCALC, TRIG, CHOLHDL, LDLDIRECT in the last 72 hours. Thyroid Function Tests: No results for input(s): TSH, T4TOTAL, FREET4, T3FREE, THYROIDAB in the last 72 hours. Anemia Panel: No results for input(s): VITAMINB12, FOLATE, FERRITIN, TIBC, IRON, RETICCTPCT in the last 72 hours. Urine analysis:    Component Value Date/Time   COLORURINE YELLOW 07/21/2019 [place] 07/21/2019 1431   LABSPEC 1.010 07/21/2019 1431   PHURINE 6.0 07/21/2019 1431   GLUCOSEU NEGATIVE 07/21/2019 1431   HGBUR NEGATIVE 07/21/2019 1431   BILIRUBINUR NEGATIVE 07/21/2019 1431   KETONESUR NEGATIVE 07/21/2019 1431   PROTEINUR NEGATIVE 07/21/2019 1431   NITRITE NEGATIVE 07/21/2019 1431   LEUKOCYTESUR NEGATIVE 07/21/2019 1431     Tannor Pyon M.D. [facility] 07/26/2019, 11:56 AM   Call night coverage person covering after 7pm

## 2019-07-26 NOTE — Progress Notes (Signed)
Physical Therapy Treatment Patient Details Name: Lacey Cook MRN: [id number] DOB: 03-24-1956 Today's Date: 07/26/2019    History of Present Illness 64 y.o. female with medical history significant of Alzheimer's dementia, depression who presents to the ED with bilateral knee pain associated with swelling, redness, and inability to walk.  Pt to have I and D of knee wounds on 3/2.    PT Comments    Pt with limited participation today.  She participated with a few exercises (see below) and repositioning in bed.  Pt resisting further exercises and attempts to transfers, despite increased time, education, and multiple methods to encourage (attempted step by step cues, general cues "let's sit up", and encouragement to get up to bathroom).   Follow Up Recommendations  SNF     Equipment Recommendations  None recommended by PT    Recommendations for Other Services       Precautions / Restrictions Precautions Precautions: Fall Restrictions Weight Bearing Restrictions: No RLE Weight Bearing: Weight bearing as tolerated LLE Weight Bearing: Weight bearing as tolerated    Mobility  Bed Mobility Overal bed mobility: Needs Assistance    Assisted pt in bed positioning and placement of Prevalon Boots. - Mod A , pt able to lift legs with cues            Transfers                    Ambulation/Gait                 Stairs             Wheelchair Mobility    Modified Rankin (Stroke Patients Only)       Balance                                            Cognition Arousal/Alertness: Awake/alert Behavior During Therapy: Flat affect Overall Cognitive Status: History of cognitive impairments - at baseline                                 General Comments: Pt was able to clearly answer yes/no to 90% of questions appropriately;  on occasion she said a sentenace (stated "not getting up b/c of pain"; only oriented to self       Exercises General Exercises - Lower Extremity - limited by pt's participation Ankle Circles/Pumps: AROM;10 reps;Supine Quad Sets: AROM;Both(1 rep) Heel Slides: PROM(1 rep with limited ROM) Straight Leg Raises: AAROM;Both;Supine(2reps)    General Comments General comments (skin integrity, edema, etc.): Attempted multiple methods to encourage pt for OOB activity or just sitting EOB but she stated "no" multiple times sternly.  Attempted to help slide legs to EOB but pt stated "no" and resisted.  Unable to perform OOB acitivity.      Pertinent Vitals/Pain Pain Assessment: Faces Faces Pain Scale: Hurts even more Pain Location: Grimaced with LE movement and rubbed knees Pain Descriptors / Indicators: Grimacing;Guarding;Moaning Pain Intervention(s): Limited activity within patient's tolerance;Repositioned;Monitored during session    Home Living                      Prior Function            PT Goals (current goals can now be found in the care plan section) Progress towards PT goals:  Not progressing toward goals - comment(not participating due to reports of pain)    Frequency    Min 2X/week      PT Plan Current plan remains appropriate    Co-evaluation              AM-PAC PT "6 Clicks" Mobility   Outcome Measure  Help needed turning from your back to your side while in a flat bed without using bedrails?: Total Help needed moving from lying on your back to sitting on the side of a flat bed without using bedrails?: Total Help needed moving to and from a bed to a chair (including a wheelchair)?: Total Help needed standing up from a chair using your arms (e.g., wheelchair or bedside chair)?: Total Help needed to walk in hospital room?: Total Help needed climbing 3-5 steps with a railing? : Total 6 Click Score: 6    End of Session   Activity Tolerance: Other (comment);Patient limited by pain(limited by willingness to participate) Patient left: in bed;with call  bell/phone within reach;with bed alarm set Nurse Communication: Mobility status PT Visit Diagnosis: History of falling (Z91.81);Difficulty in walking, not elsewhere classified (R26.2)     Time: [id number] PT Time Calculation (min) (ACUTE ONLY): 13 min  Charges:  $Therapeutic Exercise: 8-22 mins                     Maggie Font, PT Acute Rehab Services Pager [phone] [facility] [phone]    Karlton Lemon 07/26/2019, 1:16 PM

## 2019-07-26 NOTE — TOC Progression Note (Addendum)
Transition of Care [facility]) - Progression Note    Patient Details  Name: Lacey Cook MRN: [id number] Date of Birth: February 24, 1956  [facility] of [facility]) CM/SW Contact  Leeroy Cha, RN Phone Number: 07/26/2019, 2:46 PM  Clinical Narrative:    tct-[name] the daughter/ message left on voice mail to reutrn call about getting poa for Mother.  Will need to be here in person and [name] can assist. Daughter is here in the building/text page sent to the chaplin to see if they can do. [phone].  Expected Discharge Plan: [place] Barriers to Discharge: Continued Medical Work up  Expected Discharge Plan and Services Expected Discharge Plan: [place] In-house Referral: Clinical Social Work   Post Acute Care Choice: [place] Living arrangements for the past 2 months: Single Family Home                                       Social Determinants of Health (SDOH) Interventions    Readmission Risk Interventions No flowsheet data found.

## 2019-07-27 ENCOUNTER — Inpatient Hospital Stay (HOSPITAL_COMMUNITY): Payer: Medicaid Other | Admitting: Anesthesiology

## 2019-07-27 ENCOUNTER — Encounter (HOSPITAL_COMMUNITY)
Admission: EM | Disposition: A | Discharge status: Skilled Nursing Facility | Payer: Self-pay | Source: Home / Self Care | Attending: Internal Medicine

## 2019-07-27 ENCOUNTER — Encounter (HOSPITAL_COMMUNITY): Payer: Self-pay | Admitting: Internal Medicine

## 2019-07-27 DIAGNOSIS — S81001A Unspecified open wound, right knee, initial encounter: Secondary | ICD-10-CM

## 2019-07-27 DIAGNOSIS — S81002A Unspecified open wound, left knee, initial encounter: Secondary | ICD-10-CM

## 2019-07-27 DIAGNOSIS — L8990 Pressure ulcer of unspecified site, unspecified stage: Secondary | ICD-10-CM | POA: Insufficient documentation

## 2019-07-27 HISTORY — PX: APPLICATION OF A-CELL OF EXTREMITY: SHX6303

## 2019-07-27 HISTORY — PX: I & D EXTREMITY: SHX5045

## 2019-07-27 LAB — BASIC METABOLIC PANEL
Anion gap: 9 (ref 5–15)
BUN: 23 mg/dL (ref 8–23)
CO2: 27 mmol/L (ref 22–32)
Calcium: 8 mg/dL — ABNORMAL LOW (ref 8.9–10.3)
Chloride: 99 mmol/L (ref 98–111)
Creatinine, Ser: 0.51 mg/dL (ref 0.44–1.00)
GFR calc Af Amer: >60 mL/min (ref >60)
GFR calc non Af Amer: >60 mL/min (ref >60)
Glucose, Bld: 114 mg/dL — ABNORMAL HIGH (ref 70–99)
Potassium: 3.8 mmol/L (ref 3.5–5.1)
Sodium: 135 mmol/L (ref 135–145)

## 2019-07-27 LAB — CBC
HCT: 34.8 % — ABNORMAL LOW (ref 36.0–46.0)
Hemoglobin: 11.4 g/dL — ABNORMAL LOW (ref 12.0–15.0)
MCH: 32.9 pg (ref 26.0–34.0)
MCHC: 32.8 g/dL (ref 30.0–36.0)
MCV: 100.6 fL — ABNORMAL HIGH (ref 80.0–100.0)
Platelets: 261 10*3/uL (ref 150–400)
RBC: 3.46 MIL/uL — ABNORMAL LOW (ref 3.87–5.11)
RDW: 13.2 % (ref 11.5–15.5)
WBC: 8.1 10*3/uL (ref 4.0–10.5)
nRBC: 0 % (ref 0.0–0.2)

## 2019-07-27 LAB — GLUCOSE, CAPILLARY
Glucose-Capillary: 107 mg/dL — ABNORMAL HIGH (ref 70–99)
Glucose-Capillary: 120 mg/dL — ABNORMAL HIGH (ref 70–99)

## 2019-07-27 LAB — SURGICAL PCR SCREEN
MRSA, PCR: NEGATIVE
Staphylococcus aureus: NEGATIVE

## 2019-07-27 SURGERY — IRRIGATION AND DEBRIDEMENT EXTREMITY
Anesthesia: General | Site: Knee | Laterality: Bilateral

## 2019-07-27 MED ORDER — DEXTROSE-NACL 5-0.45 % IV SOLN: INTRAVENOUS | Status: AC

## 2019-07-27 MED ORDER — ONDANSETRON HCL 4 MG/2ML IJ SOLN
INTRAMUSCULAR | Status: AC
  Filled 2019-07-27: qty 2

## 2019-07-27 MED ORDER — DEXAMETHASONE SODIUM PHOSPHATE 10 MG/ML IJ SOLN
INTRAMUSCULAR | Status: AC
  Filled 2019-07-27: qty 1

## 2019-07-27 MED ORDER — CHLORHEXIDINE GLUCONATE CLOTH 2 % EX PADS: 6.0000 | MEDICATED_PAD | Freq: Once | CUTANEOUS | Status: AC

## 2019-07-27 MED ORDER — CHLORHEXIDINE GLUCONATE CLOTH 2 % EX PADS: 6.0000 | MEDICATED_PAD | Freq: Once | CUTANEOUS | Status: DC

## 2019-07-27 MED ORDER — ONDANSETRON HCL 4 MG/2ML IJ SOLN: 4.0000 mg | Freq: Once | INTRAMUSCULAR | Status: DC | PRN

## 2019-07-27 MED ORDER — ACETAMINOPHEN 500 MG PO TABS
1000.0000 mg | ORAL_TABLET | Freq: Four times a day (QID) | ORAL | Status: DC | PRN
  Administered 2019-07-28: 1000 mg via ORAL
  Filled 2019-07-27: qty 2

## 2019-07-27 MED ORDER — LIDOCAINE 2% (20 MG/ML) 5 ML SYRINGE
INTRAMUSCULAR | Status: AC
  Filled 2019-07-27: qty 5

## 2019-07-27 MED ORDER — FENTANYL CITRATE (PF) 100 MCG/2ML IJ SOLN
INTRAMUSCULAR | Status: DC | PRN
  Administered 2019-07-27: 25 ug via INTRAVENOUS
  Administered 2019-07-27: 100 ug via INTRAVENOUS
  Administered 2019-07-27: 25 ug via INTRAVENOUS
  Administered 2019-07-27: 50 ug via INTRAVENOUS

## 2019-07-27 MED ORDER — PROPOFOL 10 MG/ML IV BOLUS
INTRAVENOUS | Status: DC | PRN
  Administered 2019-07-27: 125 ug/kg/min via INTRAVENOUS

## 2019-07-27 MED ORDER — PROPOFOL 10 MG/ML IV BOLUS
INTRAVENOUS | Status: DC | PRN
  Administered 2019-07-27: 160 mg via INTRAVENOUS

## 2019-07-27 MED ORDER — BUPIVACAINE-EPINEPHRINE 0.25% -1:200000 IJ SOLN
INTRAMUSCULAR | Status: DC | PRN
  Administered 2019-07-27: 20 mL

## 2019-07-27 MED ORDER — LIDOCAINE 2% (20 MG/ML) 5 ML SYRINGE
INTRAMUSCULAR | Status: DC | PRN
  Administered 2019-07-27: 80 mg via INTRAVENOUS

## 2019-07-27 MED ORDER — HYDROMORPHONE HCL 1 MG/ML IJ SOLN: 0.2500 mg | INTRAMUSCULAR | Status: DC | PRN

## 2019-07-27 MED ORDER — CEFAZOLIN SODIUM-DEXTROSE 2-4 GM/100ML-% IV SOLN
2.0000 g | INTRAVENOUS | Status: AC
  Administered 2019-07-27: 2 g via INTRAVENOUS

## 2019-07-27 MED ORDER — MEPERIDINE HCL 50 MG/ML IJ SOLN: 6.2500 mg | INTRAMUSCULAR | Status: DC | PRN

## 2019-07-27 MED ORDER — PROPOFOL 1000 MG/100ML IV EMUL
INTRAVENOUS | Status: AC
  Filled 2019-07-27: qty 100

## 2019-07-27 MED ORDER — 0.9 % SODIUM CHLORIDE (POUR BTL) OPTIME
TOPICAL | Status: DC | PRN
  Administered 2019-07-27: 1000 mL

## 2019-07-27 MED ORDER — 0.9 % SODIUM CHLORIDE (POUR BTL) OPTIME
TOPICAL | Status: DC | PRN
  Administered 2019-07-27: 16:00:00 1000 mL

## 2019-07-27 MED ORDER — BUPIVACAINE-EPINEPHRINE 0.5% -1:200000 IJ SOLN
INTRAMUSCULAR | Status: AC
  Filled 2019-07-27: qty 1

## 2019-07-27 MED ORDER — CEFAZOLIN SODIUM-DEXTROSE 2-4 GM/100ML-% IV SOLN
INTRAVENOUS | Status: AC
  Filled 2019-07-27: qty 100

## 2019-07-27 MED ORDER — FENTANYL CITRATE (PF) 100 MCG/2ML IJ SOLN
INTRAMUSCULAR | Status: AC
  Filled 2019-07-27: qty 2

## 2019-07-27 MED ORDER — MUPIROCIN 2 % EX OINT
1.0000 "application " | TOPICAL_OINTMENT | Freq: Two times a day (BID) | CUTANEOUS | Status: DC
  Administered 2019-07-27 – 2019-07-28 (×3): 1 via NASAL
  Filled 2019-07-27: qty 22

## 2019-07-27 MED ORDER — SODIUM CHLORIDE 0.9 % IV SOLN
INTRAVENOUS | Status: AC
  Filled 2019-07-27: qty 500000

## 2019-07-27 MED ORDER — LACTATED RINGERS IV SOLN: INTRAVENOUS | Status: DC

## 2019-07-27 MED ORDER — SODIUM CHLORIDE (PF) 0.9 % IJ SOLN
INTRAMUSCULAR | Status: AC
  Filled 2019-07-27: qty 10

## 2019-07-27 SURGICAL SUPPLY — 54 items
BAG ZIPLOCK 12X15 (MISCELLANEOUS) ×3 IMPLANT
BNDG ELASTIC 4X5.8 VLCR STR LF (GAUZE/BANDAGES/DRESSINGS) IMPLANT
BNDG ELASTIC 6X5.8 VLCR STR LF (GAUZE/BANDAGES/DRESSINGS) ×1 IMPLANT
BNDG GAUZE ELAST 4 BULKY (GAUZE/BANDAGES/DRESSINGS) IMPLANT
CORD BIPOLAR FORCEPS 12FT (ELECTRODE) IMPLANT
COVER SURGICAL LIGHT HANDLE (MISCELLANEOUS) ×3 IMPLANT
COVER WAND RF STERILE (DRAPES) IMPLANT
CUFF TOURN SGL QUICK 18X4 (TOURNIQUET CUFF) IMPLANT
CUFF TOURN SGL QUICK 24 (TOURNIQUET CUFF)
CUFF TRNQT CYL 24X4X16.5-23 (TOURNIQUET CUFF) IMPLANT
DRAIN CHANNEL 19F RND (DRAIN) IMPLANT
DRAPE SURG 17X11 SM STRL (DRAPES) ×1 IMPLANT
DRSG EMULSION OIL 3X16 NADH (GAUZE/BANDAGES/DRESSINGS) IMPLANT
DRSG MEPILEX BORDER 4X12 (GAUZE/BANDAGES/DRESSINGS) ×2 IMPLANT
DRSG MEPILEX BORDER 4X4 (GAUZE/BANDAGES/DRESSINGS) ×2 IMPLANT
DRSG MEPILEX BORDER 4X8 (GAUZE/BANDAGES/DRESSINGS) ×2 IMPLANT
DRSG MEPILEX SACRM 8.7X9.8 (GAUZE/BANDAGES/DRESSINGS) ×2 IMPLANT
DRSG PAD ABDOMINAL 8X10 ST (GAUZE/BANDAGES/DRESSINGS) IMPLANT
DURAPREP 26ML APPLICATOR (WOUND CARE) ×3 IMPLANT
ELECT REM PT RETURN 15FT ADLT (MISCELLANEOUS) ×3 IMPLANT
EVACUATOR SILICONE 100CC (DRAIN) IMPLANT
GAUZE SPONGE 4X4 12PLY STRL (GAUZE/BANDAGES/DRESSINGS) IMPLANT
GLOVE BIO SURGEON STRL SZ 6.5 (GLOVE) ×2 IMPLANT
GLOVE BIO SURGEONS STRL SZ 6.5 (GLOVE) ×1
GLOVE SURG ORTHO 8.0 STRL STRW (GLOVE) IMPLANT
HANDPIECE INTERPULSE COAX TIP (DISPOSABLE)
KIT BASIN OR (CUSTOM PROCEDURE TRAY) ×3 IMPLANT
KIT TURNOVER KIT A (KITS) ×2 IMPLANT
MANIFOLD NEPTUNE II (INSTRUMENTS) ×3 IMPLANT
MATRIX WOUND 3-LAYER 7X10 (Tissue) ×1 IMPLANT
MICROMATRIX 1000MG (Tissue) ×3 IMPLANT
NDL HYPO 25X1 1.5 SAFETY (NEEDLE) IMPLANT
NEEDLE HYPO 22GX1.5 SAFETY (NEEDLE) IMPLANT
NEEDLE HYPO 25X1 1.5 SAFETY (NEEDLE) IMPLANT
NS IRRIG 1000ML POUR BTL (IV SOLUTION) ×3 IMPLANT
PACK ORTHO EXTREMITY (CUSTOM PROCEDURE TRAY) ×3 IMPLANT
PAD CAST 3X4 CTTN HI CHSV (CAST SUPPLIES) IMPLANT
PAD CAST 4YDX4 CTTN HI CHSV (CAST SUPPLIES) ×2 IMPLANT
PADDING CAST COTTON 3X4 STRL (CAST SUPPLIES)
PADDING CAST COTTON 4X4 STRL (CAST SUPPLIES) ×4
PENCIL SMOKE EVACUATOR (MISCELLANEOUS) ×2 IMPLANT
SET HNDPC FAN SPRY TIP SCT (DISPOSABLE) IMPLANT
SOL PREP POV-IOD 4OZ 10% (MISCELLANEOUS) ×3 IMPLANT
SOLUTION PARTIC MCRMTRX 1000MG (Tissue) IMPLANT
STAPLER VISISTAT 35W (STAPLE) IMPLANT
SURGILUBE 2OZ TUBE FLIPTOP (MISCELLANEOUS) ×3 IMPLANT
SUT SILK 0 FSL (SUTURE) IMPLANT
SUT SILK 4 0 PS 2 (SUTURE) ×3 IMPLANT
SUT VIC AB 5-0 PS2 18 (SUTURE) ×6 IMPLANT
SWAB COLLECTION DEVICE MRSA (MISCELLANEOUS) IMPLANT
SWAB CULTURE ESWAB REG 1ML (MISCELLANEOUS) IMPLANT
SYR CONTROL 10ML LL (SYRINGE) IMPLANT
WATER STERILE IRR 1000ML POUR (IV SOLUTION) ×1 IMPLANT
WOUND MATRIX 3-LAYER 7X10 (Tissue) ×1 IMPLANT

## 2019-07-27 NOTE — Op Note (Signed)
DATE OF OPERATION: 07/27/2019  LOCATION: [place] Main Operating Room  PREOPERATIVE DIAGNOSIS: Bilateral knee wound  POSTOPERATIVE DIAGNOSIS: Same  PROCEDURE:  1. Excision of nonviable skin and fat of left knee 4 x 6 cm 2. Excision of nonviable skin and fat of right knee 4 x 4 cm 3. Placement of Acell left knee 4 x 6 cm sheet and 500 mg powder 4. Placement of Acell right knee 4 x 4 cm sheet and 500 mg powder  SURGEON: Lorree Millar [employer], DO  ASSISTANT: Phoebe Sharps, PA  EBL: 2 cc  CONDITION: Stable  COMPLICATIONS: None  INDICATION: The patient, Lacey Cook, is a 64 y.o. female born on 06-22-1955, is here for treatment of bilateral leg wounds.   PROCEDURE DETAILS:  The patient was seen prior to surgery and marked.  The IV antibiotics were given. The patient was taken to the operating room and given a general anesthetic. A standard time out was performed and all information was confirmed by those in the room.  The knees were prepped and draped with Betadine.     Right knee: The #10 blade was used to excise the 4 x 4 centimeter area of nonviable skin and soft tissue.  It was a black eschar.  I was able to get down to healthier fat.  The Pulsavac was used to clean the wound.  Local with epinephrine was injected around the periwound area for intraoperative hemostasis and postop pain control.  Hemostasis was achieved with pressure.  The ACell powder (500 mg) and a 4 x 4 cm sheet of the ACell was applied.  The sheet was secured with 5-0 Vicryl.  An Adaptic was placed over the ACell and secured with a 5-0 Vicryl.  A 2 x 2 gauze with KY gel and a sterile dressing was applied.  Left knee: The #10 blade was used to excise the 4 x 6 cm area of nonviable necrotic skin and soft tissue.  Once there was viable adipose noted this was cleaned with the Pulsavac.  Local with epinephrine was injected at the periwound area for intraoperative hemostasis and postop pain control.  Hemostasis was achieved with  electrocautery.  The ACell powder (500 mg) and sheet (4 x 6 cm) was applied to the wound.  This was secured with 5-0 Vicryl.  The Adaptic was then applied and secured with a 5-0 Vicryl.  KY gel and sterile gauze was applied. The patient was allowed to wake up and taken to recovery room in stable condition at the end of the case. The family was notified at the end of the case.   The advanced practice practitioner (APP) assisted throughout the case.  The APP was essential in retraction and counter traction when needed to make the case progress smoothly.  This retraction and assistance made it possible to see the tissue plans for the procedure.  The assistance was needed for blood control, tissue re-approximation and assisted with closure of the incision site.  The [place] was signed into law in 2016 which includes the topic of electronic health records.  This provides immediate access to information in MyChart.  This includes consultation notes, operative notes, office notes, lab results and pathology reports.  If you have any questions about what you read please let us know at your next visit or call us at the office.  We are right here with you.

## 2019-07-27 NOTE — Progress Notes (Signed)
[facility]                                                                              Patient Demographics  Lacey Cook, is a 64 y.o. female, DOB - [birth year], [id number]  Admit date - 07/21/2019   Admitting Physician Eric J [place] DO  Outpatient Primary MD for the patient is Richarda Osmond, DO  Outpatient specialists:   LOS - 6  days   Medical records reviewed and are as summarized below:    Chief Complaint  Patient presents with  . Failure To Thrive       Brief summary   Lacey Cook is a 64 y.o.femalewith medical history significant ofAlzheimer's dementia, depression who presents to the ED with bilateral knee pain associated with swelling, redness, and inability to walk. Patient is accompanied by her daughter, who gives the majority of their HPI due to her underlying dementia. Apparently patient fell 1 week ago, and has since been crawling on her knees. At baseline she is able to ambulate with the use of a walker, but has not been unable to over the past 1 week. Also, patient has not been eating well over the past day. She has severe osteoarthritis, especially of the right hip. Per daughter's report, she is been told that she is not an operative candidate due to her underlying Alzheimer's dementia.  CT head showed no acute intracranial abnormalities, progressive asymmetric atrophy of the left frontal and temporal lobe.  Right hip x-ray with no acute fracture/dislocation with severe degenerative changes of the right hip.  Right and left left knee x-ray with no acute fracture or dislocation.  Patient was admitted for bilateral knee wounds, left knee eschar.  Moderate right knee effusion cellulitis, debility.  Currently awaiting debridement of the eschar of bilateral knee wounds and placement of wound matrix by plastic surgery, scheduled on 3/2  Assessment & Plan    Bilateral knee wounds, cellulitis, present on admission -Patient  presented from home following a fall a week PTA.  She had been crawling on her knees for mobility, now developed significant eschar formation with surrounding cellulitic changes to bilateral knees, right heel blister. -Patient was evaluated by orthopedics, Dr. Sallye Lat, no concern for septic arthritis, recommended IV antibiotics and local wound care -Continue ceftriaxone IV -Plastic surgery, Dr. Marla Roe consulted -Plan for surgical debridement today, n.p.o., holding Lovenox  Mild acute transaminitis -Unclear etiology, resolved, acute hepatitis panel nonreactive, no abdominal symptoms  Chronic right hip pain secondary to advanced osteoarthritis, moderate right knee effusion -X-rays bilateral hips and knees with no acute fracture or dislocation, moderate to severe osteoarthritis with right knee effusion -Left hip CT with no fracture/AVN but with moderate subcutaneous inflammation edema fluid versus hematoma -Seen by orthopedics, Dr. Griffin Basil, no concern for septic arthritis, recommended continue IV antibiotics, local wound care -Currently no acute issues  Generalized weakness, gait disturbance, debility -Daughter reported that usually at baseline able to ambulate with the use of a walker, however fell 1 week PTA, since had been crawling on the knees and unable to ambulate appropriately. -Patient has history of severe osteoarthritis, had been told by her PCP,  that she is not a candidate for operative management due to advanced dementia -Currently lives with daughter.  Daughter has medical issues and unable to care for the patient given higher needs for ADLs. -PT OT evaluation completed, plan to discharge to [facility] once medically cleared, once cleared by plastic surgery  Advanced Alzheimer's dementia -Supportive care, not on any home medications -Continue delirium precautions, melatonin -Much more awake  Depression -Continue Effexor  Pressure wounds PTA Left knee POA, Right knee  unstageable, full-thickness tissue loss, POA Right heel samples blister, POA  Code Status: Full code DVT Prophylaxis: Holding Lovenox, will place SCDs Family Communication: Discussed all imaging results, lab results, explained to the patient's daughter on the phone today   Disposition Plan: Patient from home, anticipated disposition to [facility] after surgery and when medically cleared.  Currently on IV antibiotics, pending surgical debridement on 3/2 precludes safe discharge at this time.   Time Spent in minutes 20 minutes  Procedures:  None  Consultants:   Orthopedics, Dr. Griffin Basil Plastic surgery, Dr. Marla Roe  Antimicrobials:   Anti-infectives (From admission, onward)   Start     Dose/Rate Route Frequency Ordered Stop   07/21/19 1800  cefTRIAXone (ROCEPHIN) 1 g in sodium chloride 0.9 % 100 mL IVPB     1 g 200 mL/hr over 30 Minutes Intravenous Daily 07/21/19 1711           Medications  Scheduled Meds: . bisacodyl  10 mg Rectal Once  . feeding supplement (ENSURE ENLIVE)  237 mL Oral BID BM  . Melatonin  3 mg Oral QHS  . multivitamin with minerals  1 tablet Oral Daily  . mupirocin ointment  1 application Nasal BID  . venlafaxine  75 mg Oral BID   Continuous Infusions: . sodium chloride    . cefTRIAXone (ROCEPHIN)  IV 1 g (07/27/19 0901)  . dextrose 5 % and 0.45% NaCl 75 mL/hr at 07/27/19 0940   PRN Meds:.sodium chloride, acetaminophen **OR** acetaminophen, albuterol, HYDROcodone-acetaminophen, Muscle Rub, ondansetron **OR** ondansetron (ZOFRAN) IV, polyethylene glycol      Subjective:   [name] was seen and examined today.  Overnight had issues with tachycardia, heart rate in low 100s.  EKG showed sinus tachycardia.  On exam, no chest pain, shortness of breath, dizziness or lightheadedness.  Appears to be comfortable.  No fevers.   Objective:   Vitals:   07/27/19 0041 07/27/19 0232 07/27/19 0328 07/27/19 0830  BP:  130/64 135/83 130/80    Pulse: 100 (!) 115 (!) 108 94  Resp:   20 17  Temp:  99 F (37.2 C) 98 F (36.7 C) 97.7 F (36.5 C)  TempSrc:  Oral Oral Oral  SpO2:  93% 95% 93%  Weight:      Height:        Intake/Output Summary (Last 24 hours) at 07/27/2019 1250 Last data filed at 07/27/2019 1000 Gross per 24 hour  Intake 850 ml  Output 1100 ml  Net -250 ml     Wt Readings from Last 3 Encounters:  07/23/19 84.2 kg  11/24/18 88.3 kg  12/06/17 81.6 kg   Physical Exam  General: Alert and oriented x self, NAD, dementia  Cardiovascular: S1 S2 clear, RRR. No pedal edema b/l  Respiratory: CTAB, no wheezing, rales or rhonchi  Gastrointestinal: Soft, nontender, nondistended, NBS  Ext: no pedal edema bilaterally  Neuro: no new deficits  Musculoskeletal: No cyanosis, clubbing  Skin: See below, eschars on bilateral knees  Psych: Pleasant,  confused      RIGHT KNEE    LEFT KNEE     Right HEEL     Data Reviewed:  I have personally reviewed following labs and imaging studies  Micro Results Recent Results (from the past 240 hour(s))  Urine culture     Status: None   Collection Time: 07/21/19  2:31 PM   Specimen: Urine, Clean Catch  Result Value Ref Range Status   Specimen Description   Final    URINE, CLEAN CATCH Performed at [facility], [place] [address], [place], [place] [address]    Special Requests   Final    NONE Performed at [facility], [place] [address] [place], [place] [address]    Culture   Final    NO GROWTH Performed at [facility], [place] [address] [place], [place] [address]    Report Status 07/23/2019 FINAL  Final  SARS CORONAVIRUS 2 (TAT 6-24 HRS) Nasopharyngeal Nasopharyngeal Swab     Status: None   Collection Time: 07/21/19  5:10 PM   Specimen: Nasopharyngeal Swab  Result Value Ref Range Status   SARS Coronavirus 2 NEGATIVE NEGATIVE Final    Comment: (NOTE) SARS-CoV-2 target nucleic acids are NOT DETECTED. The  SARS-CoV-2 RNA is generally detectable in upper and lower respiratory specimens during the acute phase of infection. Negative results do not preclude SARS-CoV-2 infection, do not rule out co-infections with other pathogens, and should not be used as the sole basis for treatment or other patient management decisions. Negative results must be combined with clinical observations, patient history, and epidemiological information. The expected result is Negative. Fact Sheet for Patients: SugarRoll.be Fact Sheet for Healthcare Providers: https://www.woods-mathews.com/ This test is not yet approved or cleared by the [place] FDA and  has been authorized for detection and/or diagnosis of SARS-CoV-2 by FDA under an Emergency Use Authorization (EUA). This EUA will remain  in effect (meaning this test can be used) for the duration of the COVID-19 declaration under Section 56 4(b)(1) of the Act, 21 U.S.C. section 360bbb-3(b)(1), unless the authorization is terminated or revoked sooner. Performed at [facility], [place] [address] [place], [place] [address]   Surgical PCR screen     Status: None   Collection Time: 07/27/19  9:08 AM   Specimen: Nasal Mucosa; Nasal Swab  Result Value Ref Range Status   MRSA, PCR NEGATIVE NEGATIVE Final   Staphylococcus aureus NEGATIVE NEGATIVE Final    Comment: (NOTE) The Xpert SA Assay (FDA approved for NASAL specimens in patients 60 years of age and older), is one component of a comprehensive surveillance program. It is not intended to diagnose infection nor to guide or monitor treatment. Performed at [facility], [place] [address] [place], [place] [address]     Radiology Reports CT Head Wo Contrast  Result Date: 07/21/2019 CLINICAL DATA:  Head trauma. Altered mental status. Failure to thrive. EXAM: CT HEAD WITHOUT CONTRAST TECHNIQUE: Contiguous axial images were obtained from the base of  the skull through the vertex without intravenous contrast. COMPARISON:  04/25/2017 FINDINGS: Brain: There is progressive prominent volume loss in the left frontal lobe with milder volume loss in the right frontal lobe. Asymmetric volume loss is also present in the left temporal lobe. Hypodensities in the cerebral white matter bilaterally are similar to the prior study and nonspecific but compatible with mild chronic small vessel ischemic disease. There is no evidence of acute infarct, intracranial hemorrhage, mass, midline shift,  or extra-axial fluid collection. Vascular: Calcified atherosclerosis at the skull base. No hyperdense vessel. Skull: No fracture or suspicious osseous lesion. Sinuses/Orbits: Mild mucosal thickening in the maxillary sinuses. Clear mastoid air cells. Unremarkable orbits. Other: None. IMPRESSION: 1. No evidence of acute intracranial abnormality. 2. Progressive asymmetric atrophy involving the left frontal and temporal lobes. Electronically Signed   By: Logan Bores M.D.   On: 07/21/2019 16:06   CT Hip Left Wo Contrast  Result Date: 07/21/2019 CLINICAL DATA:  Poly trauma. Unable to walk. Left hip pain. EXAM: CT OF THE LEFT HIP WITHOUT CONTRAST TECHNIQUE: Multidetector CT imaging of the left hip was performed according to the standard protocol. Multiplanar CT image reconstructions were also generated. COMPARISON:  Radiographs 07/21/2019 FINDINGS: The left hip is normally located. No hip fracture or AVN. The acetabulum is intact. No left-sided pelvic fractures are identified. The pubic symphysis and visualized left SI joint are intact. Moderate subcutaneous inflammation/edema/fluid or hematoma likely representing a direct contusion. No obvious intramuscular hematoma to suggest a significant muscle tear. Large amount of stool in the distended rectum. IMPRESSION: 1. No left hip fracture or AVN. 2. Moderate subcutaneous inflammation/edema/fluid or hematoma likely representing a direct  contusion. No obvious intramuscular hematoma to suggest a significant muscle tear. 3. Large amount of stool in the distended rectum. Electronically Signed   By: Marijo Sanes M.D.   On: 07/21/2019 17:03   DG Knee Complete 4 Views Left  Result Date: 07/21/2019 CLINICAL DATA:  Pain. EXAM: LEFT KNEE - COMPLETE 4+ VIEW COMPARISON:  None. FINDINGS: There are advanced degenerative changes of the left knee, greatest within the medial and patellofemoral compartments. There is no acute displaced fracture. No dislocation. There is a small suprapatellar joint effusion. IMPRESSION: 1. No acute displaced fracture or dislocation. 2. Advanced degenerative changes of the left knee. Electronically Signed   By: Constance Holster M.D.   On: 07/21/2019 16:07   DG Knee Complete 4 Views Right  Result Date: 07/21/2019 CLINICAL DATA:  Pain EXAM: RIGHT KNEE - COMPLETE 4+ VIEW COMPARISON:  None. FINDINGS: There are advanced degenerative changes of the right knee, greatest within the medial and patellofemoral compartments. There is no acute displaced fracture. No dislocation. There is a small to moderate-sized joint effusion. IMPRESSION: 1. No acute displaced fracture or dislocation. 2. Small to moderate-sized joint effusion. 3. Advanced degenerative changes. Electronically Signed   By: Constance Holster M.D.   On: 07/21/2019 16:10   DG Hip Unilat With Pelvis 2-3 Views Left  Result Date: 07/21/2019 CLINICAL DATA:  Pain status post fall EXAM: DG HIP (WITH OR WITHOUT PELVIS) 2-3V LEFT COMPARISON:  None. FINDINGS: There are end-stage degenerative changes of the right hip. There is mild-to-moderate osteoarthritis of the left hip. No acute displaced fracture or dislocation. There is a moderate to large amount of stool at the level of the rectum. IMPRESSION: 1. No acute displaced fracture or dislocation. 2. Severe degenerative changes of the right hip. 3. Mild-to-moderate osteoarthritis of the left hip. Electronically Signed   By:  Constance Holster M.D.   On: 07/21/2019 16:07    Lab Data:  CBC: Recent Labs  Lab 07/22/19 0316 07/23/19 0353 07/24/19 0354 07/26/19 0437 07/27/19 0343  WBC 6.2 6.1 8.0 7.9 8.1  HGB 12.5 11.5* 12.2 11.0* 11.4*  HCT 38.1 34.9* 36.2 32.9* 34.8*  MCV 98.4 98.9 98.4 98.8 100.6*  PLT 173 197 228 231 0000000   Basic Metabolic Panel: Recent Labs  Lab 07/22/19 0316 07/23/19 0353 07/24/19 0354 07/26/19  [id number] 07/27/19 0343  NA 139 138 136 135 135  K 3.4* 4.0 4.3 3.9 3.8  CL 105 104 99 99 99  CO2 24 26 29 29 27   GLUCOSE 113* 134* 97 114* 114*  BUN 19 20 17 18 23   CREATININE 0.52 0.52 0.53 0.49 0.51  CALCIUM 8.1* 8.0* 8.2* 8.0* 8.0*  MG 2.1  --   --   --   --    GFR: Estimated Creatinine Clearance: 73.3 mL/min (by C-G formula based on SCr of 0.51 mg/dL). Liver Function Tests: Recent Labs  Lab 07/21/19 1425 07/22/19 0316 07/23/19 0353 07/24/19 0354  AST 75* 58* 38 38  ALT 25 25 22 23   ALKPHOS 57 54 50 53  BILITOT 1.4* 1.2 0.5 0.5  PROT 6.3* 6.0* 5.8* 5.9*  ALBUMIN 2.9* 2.7* 2.7* 2.6*   No results for input(s): LIPASE, AMYLASE in the last 168 hours. No results for input(s): AMMONIA in the last 168 hours. Coagulation Profile: No results for input(s): INR, PROTIME in the last 168 hours. Cardiac Enzymes: Recent Labs  Lab 07/22/19 0316  CKTOTAL 760*   BNP (last 3 results) No results for input(s): PROBNP in the last 8760 hours. HbA1C: No results for input(s): HGBA1C in the last 72 hours. CBG: Recent Labs  Lab 07/21/19 2134  GLUCAP 110*   Lipid Profile: No results for input(s): CHOL, HDL, LDLCALC, TRIG, CHOLHDL, LDLDIRECT in the last 72 hours. Thyroid Function Tests: No results for input(s): TSH, T4TOTAL, FREET4, T3FREE, THYROIDAB in the last 72 hours. Anemia Panel: No results for input(s): VITAMINB12, FOLATE, FERRITIN, TIBC, IRON, RETICCTPCT in the last 72 hours. Urine analysis:    Component Value Date/Time   COLORURINE YELLOW 07/21/2019 [place] 07/21/2019 1431   LABSPEC 1.010 07/21/2019 1431   PHURINE 6.0 07/21/2019 1431   GLUCOSEU NEGATIVE 07/21/2019 1431   HGBUR NEGATIVE 07/21/2019 1431   BILIRUBINUR NEGATIVE 07/21/2019 1431   KETONESUR NEGATIVE 07/21/2019 1431   PROTEINUR NEGATIVE 07/21/2019 1431   NITRITE NEGATIVE 07/21/2019 1431   LEUKOCYTESUR NEGATIVE 07/21/2019 1431     Bich Mchaney M.D. [facility] 07/27/2019, 12:50 PM   Call night coverage person covering after 7pm

## 2019-07-27 NOTE — Plan of Care (Cosign Needed)
Patient (before NPO to surgery) receiving ensure twice daily to improve nutritional status, received on 07/26/19.   Problem: Nutrition: Goal: Adequate nutrition will be maintained Outcome: Progressing       Nursing student assessed comfort levels, provided aid in repositioning and evaluated pain based on FACES scale due to inconsistency of verbal responses to staff.   Problem: Pain Managment: Goal: General experience of comfort will improve Outcome: Progressing       Floating heels with pillow has been implemented, protective dressings on buttocks, knees bilaterally, R elbow, and R heel assessed by nursing student (clean dry, and intact before surgical intervention for knees). Head of bed maintained at 30 degrees or less.   Problem: Skin Integrity: Goal: Risk for impaired skin integrity will decrease Outcome: Progressing

## 2019-07-27 NOTE — Anesthesia Postprocedure Evaluation (Signed)
Anesthesia Post Note  Patient: Lacey Cook  Procedure(s) Performed: Debridement of bilateral knee wounds (Bilateral Knee) placement of Acell (Bilateral Knee)     Patient location during evaluation: PACU Anesthesia Type: General Level of consciousness: awake and alert Pain management: pain level controlled Vital Signs Assessment: post-procedure vital signs reviewed and stable Respiratory status: spontaneous breathing, nonlabored ventilation, respiratory function stable and patient connected to nasal cannula oxygen Cardiovascular status: blood pressure returned to baseline and stable Postop Assessment: no apparent nausea or vomiting Anesthetic complications: no    Last Vitals:  Vitals:   07/27/19 1730 07/27/19 1745  BP: 138/79 (!) 145/77  Pulse: 94 92  Resp: (!) 22 19  Temp:  36.8 C  SpO2: 93% 94%    Last Pain:  Vitals:   07/27/19 1503  TempSrc:   PainSc: 0-No pain                 Jaidan Stachnik DAVID

## 2019-07-27 NOTE — Anesthesia Preprocedure Evaluation (Signed)
Anesthesia Evaluation  Patient identified by MRN, date of birth, ID band Patient awake    Reviewed: Allergy & Precautions, NPO status , Patient's Chart, lab work & pertinent test results  Airway Mallampati: II  TM Distance: >3 FB Neck ROM: Full    Dental   Pulmonary    Pulmonary exam normal        Cardiovascular hypertension, Pt. on medications Normal cardiovascular exam     Neuro/Psych Depression Dementia    GI/Hepatic   Endo/Other  diabetes, Type 2  Renal/GU      Musculoskeletal   Abdominal   Peds  Hematology   Anesthesia Other Findings   Reproductive/Obstetrics                             Anesthesia Physical Anesthesia Plan  ASA: III  Anesthesia Plan: General   Post-op Pain Management:    Induction: Intravenous  PONV Risk Score and Plan: 3 and Ondansetron and Treatment may vary due to age or medical condition  Airway Management Planned: LMA  Additional Equipment:   Intra-op Plan:   Post-operative Plan: Extubation in OR  Informed Consent: I have reviewed the patients History and Physical, chart, labs and discussed the procedure including the risks, benefits and alternatives for the proposed anesthesia with the patient or authorized representative who has indicated his/her understanding and acceptance.       Plan Discussed with: CRNA and Surgeon  Anesthesia Plan Comments:         Anesthesia Quick Evaluation

## 2019-07-27 NOTE — Anesthesia Procedure Notes (Signed)
Procedure Name: LMA Insertion Date/Time: 07/27/2019 4:25 PM Performed by: Deangelo Berns D, CRNA Pre-anesthesia Checklist: Patient identified, Emergency Drugs available, Suction available and Patient being monitored Patient Re-evaluated:Patient Re-evaluated prior to induction Oxygen Delivery Method: Circle system utilized Preoxygenation: Pre-oxygenation with 100% oxygen Induction Type: IV induction Ventilation: Mask ventilation without difficulty LMA: LMA inserted LMA Size: 4.0 Tube type: Oral Number of attempts: 1 Airway Equipment and Method: Oral airway Placement Confirmation: positive ETCO2 and breath sounds checked- equal and bilateral Tube secured with: Tape

## 2019-07-27 NOTE — Transfer of Care (Signed)
Immediate Anesthesia Transfer of Care Note  Patient: Lacey Cook  Procedure(s) Performed: Debridement of bilateral knee wounds (Bilateral Knee) placement of Acell (Bilateral Knee)  Patient Location: PACU  Anesthesia Type:General  Level of Consciousness: awake, alert  and oriented  Airway & Oxygen Therapy: Patient Spontanous Breathing and Patient connected to face mask oxygen  Post-op Assessment: Report given to RN and Post -op Vital signs reviewed and stable  Post vital signs: Reviewed and stable  Last Vitals:  Vitals Value Taken Time  BP 123/101 07/27/19 1707  Temp    Pulse 100 07/27/19 1708  Resp 26 07/27/19 1708  SpO2 94 % 07/27/19 1708  Vitals shown include unvalidated device data.  Last Pain:  Vitals:   07/27/19 1503  TempSrc:   PainSc: 0-No pain      Patients Stated Pain Goal: 2 ([id number])  Complications: No apparent anesthesia complications

## 2019-07-27 NOTE — Progress Notes (Signed)
Instructor Vicenta Dunning, MSN, RN cosigning student RN's assessment

## 2019-07-27 NOTE — Discharge Instructions (Signed)
Wound Care with Acell  Guide to Wound Care  Proper wound care may reduce the risk of infection, improve healing rates, and limit scarring.  This is a general guide to help care for and manage wounds treated with ACell MicroMatrix?or Cytal Wound Matrix.   Dressing Changes The frequency of dressing changes can vary based on which product was applied, the size of the wound, or the amount of wound drainage. Dressing inspections are recommended, at least weekly.   If you don't have a Wound VAC, then place KY gel on the wound daily and cover with gauze.  Dressing Types Primary Dressing:  Non-adherent dressing goes directly over wounds being treated with the powder or sheet (MicroMatrix and/or Cytal).  Secondary Dressing:  Secures the primary dressing in place and provides extra protection, compression, and absorption.  1. Wash Hands - To help decrease the risk of infection, caregivers should wash their hands for a minimum of 20 seconds and may use medical gloves.   2. Remove the Dressings - Avoid removing product from the wound by carefully removing the applicable dressing(s) at the time points recommended above, or as recommended by the treating physician.  Expected Color and Odor:  It is entirely normal for the wound to have an unpleasant odor and to form a caramel-colored gel as the product absorbs into the wound. It is  important to leave this gel on the wound site.  3. Clean the Wound - Use clean water or saline to gently rinse around the wound surface and remove any excess discharge that may be present on the wound. Do not wipe off any of the caramel-colored gel on the wound.   What to look out for: . Large or increased amount of drainage  . Surrounding skin has worsening redness or hot to touch  . Increased pain in or around the wound  . Flu-like symptoms, fatigue, decreased appetite, fever  . Hard, crusty wound surface with black or brown coloring  4. Apply New Dressings - Dressings  should cover the entire wound and be suitable for maintaining a moist wound environment.  The non-adherent mesh dressing should be left in place.  New dressing should consist of KY Jelly to keep the wound moist and soft gauze secured with a wrap or tape.   Maintain a Hydrated Wound Area It is important to keep the wound area moist throughout the healing process. If the wound appears to be dry during dressing changes, select a dressing that will hydrate the wound and maintain that ideal moist environment. If you are unsure what to do, ask the treating physician.  Remodeling Process Every patient heals differently, and no two cases are the same. The size and location of the wound, product type and layering configurations, and general patient health all contribute to how quickly a wound will heal.  While many factors can influence the rate at which the product absorbs, the following can be used as a general guide.   THINGS TO DO: Refrain from smoking High protein diet with plenty of vegetables and some fruit  Limit simple processed carbohydrates and sugar Protect the wound from trauma Protect the dressing  Micromatrix powder       Cytal Sheet

## 2019-07-27 NOTE — Interval H&P Note (Signed)
History and Physical Interval Note:  07/27/2019 4:04 PM  Lacey Cook  has presented today for surgery, with the diagnosis of bilateral knee wounds.  The various methods of treatment have been discussed with the patient and family. After consideration of risks, benefits and other options for treatment, the patient has consented to  Procedure(s) with comments: Debridement of bilateral knee wounds (Bilateral) - 1 hour, please placement of Acell (Bilateral) APPLICATION OF WOUND VAC (Bilateral) as a surgical intervention.  The patient's history has been reviewed, patient examined, no change in status, stable for surgery.  I have reviewed the patient's chart and labs.  Questions were answered to the patient's satisfaction.     Loel Lofty Asaf Elmquist

## 2019-07-28 ENCOUNTER — Encounter: Payer: Self-pay | Admitting: *Deleted

## 2019-07-28 DIAGNOSIS — L03119 Cellulitis of unspecified part of limb: Secondary | ICD-10-CM

## 2019-07-28 LAB — BASIC METABOLIC PANEL
Anion gap: 7 (ref 5–15)
BUN: 16 mg/dL (ref 8–23)
CO2: 29 mmol/L (ref 22–32)
Calcium: 8.1 mg/dL — ABNORMAL LOW (ref 8.9–10.3)
Chloride: 98 mmol/L (ref 98–111)
Creatinine, Ser: 0.44 mg/dL (ref 0.44–1.00)
GFR calc Af Amer: >60 mL/min (ref >60)
GFR calc non Af Amer: >60 mL/min (ref >60)
Glucose, Bld: 119 mg/dL — ABNORMAL HIGH (ref 70–99)
Potassium: 3.9 mmol/L (ref 3.5–5.1)
Sodium: 134 mmol/L — ABNORMAL LOW (ref 135–145)

## 2019-07-28 LAB — CBC
HCT: 34.7 % — ABNORMAL LOW (ref 36.0–46.0)
Hemoglobin: 11.5 g/dL — ABNORMAL LOW (ref 12.0–15.0)
MCH: 32.7 pg (ref 26.0–34.0)
MCHC: 33.1 g/dL (ref 30.0–36.0)
MCV: 98.6 fL (ref 80.0–100.0)
Platelets: 300 10*3/uL (ref 150–400)
RBC: 3.52 MIL/uL — ABNORMAL LOW (ref 3.87–5.11)
RDW: 13.2 % (ref 11.5–15.5)
WBC: 7.4 10*3/uL (ref 4.0–10.5)
nRBC: 0 % (ref 0.0–0.2)

## 2019-07-28 LAB — SARS CORONAVIRUS 2 (TAT 6-24 HRS): SARS Coronavirus 2: NEGATIVE

## 2019-07-28 MED ORDER — DIPHENHYDRAMINE HCL 25 MG PO CAPS
25.0000 mg | ORAL_CAPSULE | Freq: Four times a day (QID) | ORAL | Status: DC | PRN
  Administered 2019-07-28: 25 mg via ORAL
  Filled 2019-07-28: qty 1

## 2019-07-28 NOTE — TOC Progression Note (Addendum)
Transition of Care [facility]) - Progression Note    Patient Details  Name: Lacey Cook MRN: [id number] Date of Birth: 02-Sep-1955  Transition of Care [facility]) CM/SW Contact  Leeroy Cha, RN Phone Number: 07/28/2019, 4:05 PM  Clinical Narrative:    Pt okayed tog ot [facility] asap.  Awaiting covid test.  TCT-trscy [facility].  Has bed can come when covid test is back. 1600 covid test still pending.  Expected Discharge Plan: [place] Barriers to Discharge: Continued Medical Work up  Expected Discharge Plan and Services Expected Discharge Plan: [place] In-house Referral: Clinical Social Work   Post Acute Care Choice: [place] Living arrangements for the past 2 months: Single Family Home                                       Social Determinants of Health (SDOH) Interventions    Readmission Risk Interventions No flowsheet data found.

## 2019-07-28 NOTE — Progress Notes (Signed)
   07/28/19 1300  Clinical Encounter Type  Visited With Family;Other (Comment) (Daughter)  Visit Type Initial;Psychological support;Spiritual support  Referral From Social work  Consult/Referral To Chaplain  Spiritual Encounters  Spiritual Needs Emotional;Other (Comment) (Spiritual Care Conversation/Support)  Stress Factors  Family Stress Factors Health changes;Lack of knowledge;Major life changes   I spoke with Ms. Bloom' daughter per referral from social work for an Forensic scientist. I explained that due to Ms. Samaras' mental status that we are unable to complete an Advance Directive at this time. Her daughter was understanding of this. Also, Ms. Morgans' daughter is her next-of-kin and would be her default decision maker by law.  Her daughter's main stress factors at this time are dealing with the rehabilitation facility and not being able to see her mom for a month once she goes there.  I provided support and encouragement.   Please, contact Spiritual Care for further assistance.   Chaplain Shanon Ace M.Div., [facility]

## 2019-07-28 NOTE — Progress Notes (Signed)
Occupational Therapy Treatment Patient Details Name: Lacey Cook MRN: [id number] DOB: 02/13/1956 Today's Date: 07/28/2019    History of present illness 64 y.o. female with medical history significant of Alzheimer's dementia, depression who presents to the ED with bilateral knee pain associated with swelling, redness, and inability to walk.  Pt to have I and D of knee wounds on 3/2.   OT comments  Patient required encouragement for bed mobility (supine to sit). Initially the patient agreed, but then when moving L LE to EOB patient started to verbalize "No, I don't feel well." Patient was educated on the importance of pressure relief and functional mobility but declined to participate. Patient agreeable to grooming at bed level and B UE AROM therapeutic exercises. Patient will require 24 hour total caregiver assistance upon d/c. Will continue to attempt acute care OT skilled services.    Follow Up Recommendations  SNF;Supervision/Assistance - 24 hour    Equipment Recommendations  Other (comment);Tub/shower seat    Recommendations for Other Services      Precautions / Restrictions Precautions Precautions: Fall Restrictions Weight Bearing Restrictions: No RLE Weight Bearing: Weight bearing as tolerated LLE Weight Bearing: Weight bearing as tolerated       Mobility Bed Mobility                  Transfers                      Balance                                           ADL either performed or assessed with clinical judgement   ADL       Grooming: Oral care;Wash/dry face;Wash/dry hands;Brushing hair;Set up;Bed level                                       Vision       Perception     Praxis      Cognition Arousal/Alertness: Awake/alert Behavior During Therapy: Flat affect Overall Cognitive Status: History of cognitive impairments - at baseline                                           Exercises Exercises: General Upper Extremity General Exercises - Upper Extremity Shoulder Flexion: AROM;10 reps Shoulder Extension: AROM;10 reps Shoulder ABduction: AROM;10 reps Shoulder ADduction: AROM;10 reps Shoulder Horizontal ABduction: AROM;10 reps Shoulder Horizontal ADduction: AROM;10 reps Elbow Flexion: AROM;10 reps Elbow Extension: AROM;10 reps Wrist Flexion: AROM;10 reps Wrist Extension: AROM;10 reps   Shoulder Instructions       General Comments      Pertinent Vitals/ Pain       Pain Assessment: (Facial grimaces when attempting to sit EOB with B LE) Pain Intervention(s): Repositioned  Home Living                                          Prior Functioning/Environment              Frequency           Progress Toward Goals  OT Goals(current goals can now be found in the care plan section)  Progress towards OT goals: Not progressing toward goals - comment(decrease motivation for bed mobility, sit EOB, or OOB )     Plan      Co-evaluation                 AM-PAC OT "6 Clicks" Daily Activity     Outcome Measure   Help from another person eating meals?: A Little Help from another person taking care of personal grooming?: A Little Help from another person toileting, which includes using toliet, bedpan, or urinal?: Total Help from another person bathing (including washing, rinsing, drying)?: Total Help from another person to put on and taking off regular upper body clothing?: A Lot Help from another person to put on and taking off regular lower body clothing?: Total 6 Click Score: 11    End of Session        Activity Tolerance Patient tolerated treatment well;Patient limited by pain;Treatment limited secondary to agitation   Patient Left in bed;with call bell/phone within reach;with bed alarm set   Nurse Communication Mobility status        Time: [id number] OT Time Calculation (min): 19 min  Charges: OT General  Charges $OT Visit: 1 Visit OT Treatments $Therapeutic Exercise: 8-22 mins  Lacey Cook OTR/L    Lacey Cook 07/28/2019, 12:11 PM

## 2019-07-28 NOTE — Progress Notes (Signed)
PROGRESS NOTE  Lacey Cook [id number] DOB: 22-Sep-1955 DOA: 07/21/2019 PCP: Doristine Mango L, DO   LOS: 7 days   Brief Narrative / Interim history: 64 year old female with Alzheimer's dementia, depression, who presents to the ED with bilateral knee pain associated with swelling, redness, and inability to walk. Patient is accompanied by her daughter, who gives the majority of their HPI due to her underlying dementia. Apparently patient fell 1 week ago, and has since been crawling on her knees. At baseline she is able to ambulate with the use of a walker, but has not been unable to over the past 1 week. Also, patient has not been eating well over the past day. She has severe osteoarthritis, especially of the right hip. Per daughter's report, she is been told that she is not an operative candidate due to her underlying Alzheimer's dementia. CT head showed no acute intracranial abnormalities, progressive asymmetric atrophy of the left frontal and temporal lobe.  Right hip x-ray with no acute fracture/dislocation with severe degenerative changes of the right hip.  Right and left left knee x-ray with no acute fracture or dislocation.  Patient was admitted for bilateral knee wounds, left knee eschar.  Moderate right knee effusion cellulitis, debility.  Plastic surgery consulted and she is status post debridement and placement of wound matrix on 3/2  Subjective / 24h Interval events: Underlying dementia, denies any specific complaints  Assessment & Plan: Principal Problem Bilateral knee wounds, cellulitis, present on admission -Patient presented from home following a fall a week PTA.  She had been crawling on her knees for mobility, and developed significant eschar formation with surrounding cellulitic changes to bilateral knees, right heel blister. Patient was evaluated by orthopedics, no concern for septic arthritis, recommended ntibiotics and local wound care. Plastic surgery, Dr.  Marla Roe consulted and she is status post surgical debridement on 3/2, plastic surgery evaluated patient today cleared for discharge  Active Problems Mild acute transaminitis -Unclear etiology, resolved, acute hepatitis panel nonreactive, no abdominal symptoms Chronic right hip pain secondary to advanced osteoarthritis, moderate right knee effusion -X-rays bilateral hips and knees with no acute fracture or dislocation, moderate to severe osteoarthritis with right knee effusion. Left hip CT with no fracture/AVN but with moderate subcutaneous inflammation edema fluid versus hematoma. Seen by orthopedics, Dr. Griffin Basil, no concern for septic arthritis Generalized weakness, gait disturbance, debility -Daughter reported that usually at baseline able to ambulate with the use of a walker, however fell 1 week PTA, since had been crawling on the knees and unable to ambulate appropriately. Patient has history of severe osteoarthritis, had been told by her PCP, that she is not a candidate for operative management due to advanced dementia. Currently lives with daughter. Plan to [facility] once bed is available  Advanced Alzheimer's dementia -Supportive care, not on any home medications Depression -Continue Effexor Pressure wounds PTA Left knee POA, right knee unstageable, full-thickness tissue loss, POA right heel samples blister, POA   Scheduled Meds: . bisacodyl  10 mg Rectal Once  . feeding supplement (ENSURE ENLIVE)  237 mL Oral BID BM  . Melatonin  3 mg Oral QHS  . multivitamin with minerals  1 tablet Oral Daily  . mupirocin ointment  1 application Nasal BID  . venlafaxine  75 mg Oral BID   Continuous Infusions: . sodium chloride 500 mL (07/28/19 0813)  . cefTRIAXone (ROCEPHIN)  IV 1 g (07/28/19 0940)   PRN Meds:.sodium chloride, acetaminophen **OR** acetaminophen, acetaminophen, albuterol, HYDROcodone-acetaminophen, Muscle Rub, ondansetron **  OR** ondansetron (ZOFRAN) IV,  polyethylene glycol  DVT prophylaxis: SCDs Code Status: Full code Family Communication: d/w daughter over the phone  Patient admitted from: home Anticipated d/c place: SNF Barriers to d/c: awaiting for a bed, repeat Covid pending   Consultants:  Orthopedic surgery  Plastic surgery   Procedures:  1. Excision of nonviable skin and fat of left knee 4 x 6 cm 2. Excision of nonviable skin and fat of right knee 4 x 4 cm 3. Placement of Acell left knee 4 x 6 cm sheet and 500 mg powder 4. Placement of Acell right knee 4 x 4 cm sheet and 500 mg powder  Microbiology  None   Antimicrobials: Ceftriaxone    Objective: Vitals:   07/27/19 2129 07/28/19 0527 07/28/19 0815 07/28/19 1248  BP: 138/69 131/76 (!) 148/78   Pulse: (!) 108 97 90   Resp: 18 19 18    Temp: 97.8 F (36.6 C) 98.3 F (36.8 C) 98.2 F (36.8 C)   TempSrc: Oral Oral Oral   SpO2: 94% 94% 93% 94%  Weight:      Height:        Intake/Output Summary (Last 24 hours) at 07/28/2019 1323 Last data filed at 07/28/2019 1029 Gross per 24 hour  Intake 1885 ml  Output 2150 ml  Net -265 ml   Filed Weights   07/22/19 1000 07/23/19 2048 07/27/19 1503  Weight: 84.2 kg 84.2 kg 84 kg    Examination:  Constitutional: NAD Eyes: no scleral icterus ENMT: Mucous membranes are moist.  Neck: normal, supple Respiratory: clear to auscultation bilaterally, no wheezing, no crackles. Normal respiratory effort.  Cardiovascular: Regular rate and rhythm, no murmurs / rubs / gallops. No LE edema. Good peripheral pulses Abdomen: non distended, no tenderness. Bowel sounds positive.  Musculoskeletal: no clubbing / cyanosis.  Skin: no rashes Neurologic: non focal   Data Reviewed: I have independently reviewed following labs and imaging studies   CBC: Recent Labs  Lab 07/23/19 0353 07/24/19 0354 07/26/19 0437 07/27/19 0343 07/28/19 0403  WBC 6.1 8.0 7.9 8.1 7.4  HGB 11.5* 12.2 11.0* 11.4* 11.5*  HCT 34.9* 36.2 32.9* 34.8* 34.7*    MCV 98.9 98.4 98.8 100.6* 98.6  PLT 197 228 231 261 [id number]   Basic Metabolic Panel: Recent Labs  Lab 07/22/19 0316 07/22/19 0316 07/23/19 0353 07/24/19 0354 07/26/19 0437 07/27/19 0343 07/28/19 0403  NA 139   < > 138 136 135 135 134*  K 3.4*   < > 4.0 4.3 3.9 3.8 3.9  CL 105   < > 104 99 99 99 98  CO2 24   < > 26 29 29 27 29   GLUCOSE 113*   < > 134* 97 114* 114* 119*  BUN 19   < > 20 17 18 23 16   CREATININE 0.52   < > 0.52 0.53 0.49 0.51 0.44  CALCIUM 8.1*   < > 8.0* 8.2* 8.0* 8.0* 8.1*  MG 2.1  --   --   --   --   --   --    < > = values in this interval not displayed.   Liver Function Tests: Recent Labs  Lab 07/21/19 1425 07/22/19 0316 07/23/19 0353 07/24/19 0354  AST 75* 58* 38 38  ALT 25 25 22 23   ALKPHOS 57 54 50 53  BILITOT 1.4* 1.2 0.5 0.5  PROT 6.3* 6.0* 5.8* 5.9*  ALBUMIN 2.9* 2.7* 2.7* 2.6*   Coagulation Profile: No results for input(s): INR, PROTIME in  the last 168 hours. HbA1C: No results for input(s): HGBA1C in the last 72 hours. CBG: Recent Labs  Lab 07/21/19 2134 07/27/19 1503 07/27/19 1707  GLUCAP 110* 107* 120*    Recent Results (from the past 240 hour(s))  Urine culture     Status: None   Collection Time: 07/21/19  2:31 PM   Specimen: Urine, Clean Catch  Result Value Ref Range Status   Specimen Description   Final    URINE, CLEAN CATCH Performed at [facility], [place] [address] [place], [place] [address]    Special Requests   Final    NONE Performed at [facility], [place] [address], [place], [place] [address]    Culture   Final    NO GROWTH Performed at [facility], [place] [address] [place], [place] [address]    Report Status 07/23/2019 FINAL  Final  SARS CORONAVIRUS 2 (TAT 6-24 HRS) Nasopharyngeal Nasopharyngeal Swab     Status: None   Collection Time: 07/21/19  5:10 PM   Specimen: Nasopharyngeal Swab  Result Value Ref Range Status   SARS Coronavirus 2 NEGATIVE NEGATIVE Final     Comment: (NOTE) SARS-CoV-2 target nucleic acids are NOT DETECTED. The SARS-CoV-2 RNA is generally detectable in upper and lower respiratory specimens during the acute phase of infection. Negative results do not preclude SARS-CoV-2 infection, do not rule out co-infections with other pathogens, and should not be used as the sole basis for treatment or other patient management decisions. Negative results must be combined with clinical observations, patient history, and epidemiological information. The expected result is Negative. Fact Sheet for Patients: SugarRoll.be Fact Sheet for Healthcare Providers: https://www.woods-mathews.com/ This test is not yet approved or cleared by the [place] FDA and  has been authorized for detection and/or diagnosis of SARS-CoV-2 by FDA under an Emergency Use Authorization (EUA). This EUA will remain  in effect (meaning this test can be used) for the duration of the COVID-19 declaration under Section 56 4(b)(1) of the Act, 21 U.S.C. section 360bbb-3(b)(1), unless the authorization is terminated or revoked sooner. Performed at [facility], [place] [address] [place], [place] [address]   Surgical PCR screen     Status: None   Collection Time: 07/27/19  9:08 AM   Specimen: Nasal Mucosa; Nasal Swab  Result Value Ref Range Status   MRSA, PCR NEGATIVE NEGATIVE Final   Staphylococcus aureus NEGATIVE NEGATIVE Final    Comment: (NOTE) The Xpert SA Assay (FDA approved for NASAL specimens in patients 95 years of age and older), is one component of a comprehensive surveillance program. It is not intended to diagnose infection nor to guide or monitor treatment. Performed at [facility], [place] [address] [place], [place] [address]      Radiology Studies: No results found.  Marzetta Board, MD, PhD [facility]  Between 7 am - 7 pm I am available, please contact me via Amion or  Securechat  Between 7 pm - 7 am I am not available, please contact night coverage MD/APP via Amion

## 2019-07-28 NOTE — Progress Notes (Signed)
1 Day Post-Op  Subjective: Patient is 64 yr-old female who underwent bilateral knee wound debridement with placement of ACell yesterday. This morning she is resting in bed watching TV. Most answers limited to yes, no, and I don't know. Denies pain, chest pain, SOB, HA/dizziness, abdominal pain, leg pain, N/V. Reports she has not eaten yet today, but is not hungry.  Knee wounds are covered with mepilex boarder dressing, clean and dry. Area surrounding wounds shows no sign of infection, redness, drainage, hematoma/seroma, or tenderness.  Objective: Vital signs in last 24 hours: Temp:  [97.5 F (36.4 C)-99 F (37.2 C)] 98.3 F (36.8 C) (03/03 0527) Pulse Rate:  [91-108] 97 (03/03 0527) Resp:  [15-23] 19 (03/03 0527) BP: (123-156)/(69-101) 131/76 (03/03 0527) SpO2:  [90 %-95 %] 94 % (03/03 0527) Weight:  [84 kg] 84 kg (03/02 1503) Last BM Date: 07/26/19  Intake/Output from previous day: 03/02 0701 - 03/03 0700 In: 2047.5 [P.O.:60; I.V.:1887.5; IV Piggyback:100] Out: 750 [Urine:750] Intake/Output this shift: No intake/output data recorded.  General appearance: alert, cooperative and no distress Head: Normocephalic, without obvious abnormality, atraumatic Eyes: EOMs intact Resp: normal effort Chest wall: symmetric rise and fall Extremities: bilateral knee wounds dressed with mepilex boarder dressing, clean and dry. Area surrounding wound shos no signs of infection, redness, drainage, seroma/hematoma, or tenderness.   Lab Results:  @LABLAST2 (wbc:2,hgb:2,hct:2,plt:2) BMET Recent Labs    07/27/19 0343 07/28/19 0403  NA 135 134*  K 3.8 3.9  CL 99 98  CO2 27 29  GLUCOSE 114* 119*  BUN 23 16  CREATININE 0.51 0.44  CALCIUM 8.0* 8.1*   PT/INR No results for input(s): LABPROT, INR in the last 72 hours. ABG No results for input(s): PHART, HCO3 in the last 72 hours.  Invalid input(s): PCO2, PO2  Studies/Results: No results found.  Anti-infectives: Anti-infectives (From  admission, onward)   Start     Dose/Rate Route Frequency Ordered Stop   07/28/19 0600  ceFAZolin (ANCEF) IVPB 2g/100 mL premix     2 g 200 mL/hr over 30 Minutes Intravenous On call to O.R. 07/27/19 1508 07/27/19 1627   07/27/19 1509  ceFAZolin (ANCEF) 2-4 GM/100ML-% IVPB    Note to Pharmacy: Nash Dimmer   : cabinet override      07/27/19 1509 07/27/19 1630   07/21/19 1800  cefTRIAXone (ROCEPHIN) 1 g in sodium chloride 0.9 % 100 mL IVPB     1 g 200 mL/hr over 30 Minutes Intravenous Daily 07/21/19 1711        Assessment/Plan: s/p Procedure(s): Debridement of bilateral knee wounds placement of Acell Continue daily dressing changes of bilateral knee wounds: Apply KY jelly to adaptic (mesh sutured to wound), cover with 2x2 gauze and mepilex boarder dressing.  Stable for discharge from plastic surgery perspective. Will follow as outpatient. Follow up in clinic in 3 weeks.    LOS: 7 days   Threasa Heads, PA-C 07/28/2019

## 2019-07-29 DIAGNOSIS — L8990 Pressure ulcer of unspecified site, unspecified stage: Secondary | ICD-10-CM

## 2019-07-29 DIAGNOSIS — M25552 Pain in left hip: Secondary | ICD-10-CM

## 2019-07-29 MED ORDER — HYDROCODONE-ACETAMINOPHEN 5-325 MG PO TABS: 1.0000 | ORAL_TABLET | ORAL | 0 refills | Status: AC | PRN

## 2019-07-29 MED ORDER — DOXYCYCLINE MONOHYDRATE 100 MG PO TABS: 100.0000 mg | ORAL_TABLET | Freq: Two times a day (BID) | ORAL | 0 refills | Status: AC

## 2019-07-29 NOTE — Progress Notes (Signed)
Nutrition Follow-up  DOCUMENTATION CODES:   Not applicable  INTERVENTION:  - continue Ensure Enlive BID and Magic Cup once/day. - continue to encourage PO intakes.    NUTRITION DIAGNOSIS:   Increased nutrient needs related to acute illness as evidenced by estimated needs. -ongoing  GOAL:   Patient will meet greater than or equal to 90% of their needs -minimally met on average   MONITOR:   PO intake, Supplement acceptance, Labs, Weight trends  ASSESSMENT:   64 y.o. female with medical history of Alzheimer's dementia, severe osteoarthritis (especially in the R hip), and depression. She presented to the ED with bilateral knee pain associated with swelling, redness, and inability to walk. Patient was accompanied by her daughter, who provides most of the information. The patient fell 1 week PTA and has since been crawling on her knees.  At baseline she can ambulate with the use of a walker but has not been unable to over the past 1 week. She has not been eating well over the past 1 day.  Patient has mainly been consuming 50-100% at meals. She has been accepting Ensure ~90% of the time offered. Skin documentation updated. She is POD #2 surgical debridement of bilateral knee wounds.   Discharge order and discharge summary in place since late morning with plan for d/c to home.   Labs reviewed; Na: 134 mmol/l, Ca: 8.1 mg/dl. Medications reviewed; 3 mg melatonin/day, daily multivitamin with minerals.     Diet Order:   Diet Order            Diet regular Room service appropriate? Yes; Fluid consistency: Thin  Diet effective now              EDUCATION NEEDS:   No education needs have been identified at this time  Skin:  Skin Assessment: Skin Integrity Issues: Skin Integrity Issues:: Stage I, Unstageable, Incisions, Other (Comment) Stage I: buttocks Unstageable: full thickness to R knee Incisions: bilateral knees (3/2) Other: pressure injury to L knee  Last BM:   3/3  Height:   Ht Readings from Last 1 Encounters:  07/27/19 5' 2"  (1.575 m)    Weight:   Wt Readings from Last 1 Encounters:  07/27/19 84 kg    Ideal Body Weight:  50 kg  BMI:  Body mass index is 33.87 kg/m.  Estimated Nutritional Needs:   Kcal:  1900-2150 kcal  Protein:  95-110 grams  Fluid:  >/= 2 L/day     Jarome Matin, MS, RD, LDN, CNSC Inpatient Clinical Dietitian RD pager # available in AMION  After hours/weekend pager # available in [facility]

## 2019-07-29 NOTE — Discharge Summary (Signed)
Physician Discharge Summary  Pamila Urbain [id number] DOB: Sep 29, 1955 DOA: 07/21/2019  PCP: Richarda Osmond, DO  Admit date: 07/21/2019 Discharge date: 07/29/2019  Admitted From: Home Disposition: Short-term rehab  Recommendations for Outpatient Follow-up:  1. Follow up with PCP in 1-2 weeks 2. Follow-up with plastic surgery in 3 weeks 3. Continue daily dressing changes of bilateral knee wounds: Apply KY jelly to adaptic (mesh sutured to wound), cover with 2x2 gauze and mepilex boarder dressing.   Home Health: None Equipment/Devices: None  Discharge Condition: Stable CODE STATUS: Full code Diet recommendation: Regular  HPI: Per admitting MD, Lacey Cook is a 64 y.o. female with medical history significant of Alzheimer's dementia, depression who presents to the ED with bilateral knee pain associated with swelling, redness, and inability to walk.  Patient is accompanied by her daughter, who gives the majority of their HPI due to her underlying dementia.  Apparently patient fell 1 week ago, and has since been crawling on her knees.  At baseline she is able to ambulate with the use of a walker, but has not been unable to over the past 1 week.  Also, patient has not been eating well over the past day.  She has severe osteoarthritis, especially of the right hip.  Per daughter's report, she is been told that she is not an operative candidate due to her underlying Alzheimer's dementia. ED Course: Temperature 98.2, HR 108, RR 17, BP 120/73, SPO2 100% on room air.  The BC count 8.1, hemoglobin 13.6, platelet count 210.  Sodium 140, potassium 3.5, chloride 104, CO2 28, BUN 17, creatinine 0.58.  Glucose 123.  Albumin 2.9.  AST 75, ALT 25, total bili 1.4.  CT head with no acute intracranial abnormalities but did note progressive asymmetric atrophy of the left frontal and temporal lobe.  Right hip x-ray with no acute fracture/dislocation with severe degenerative changes of the right hip.  Mild  osteoarthritis left hip.  Left knee x-ray with no acute fracture/dislocation, advanced degenerative changes left knee.  Right knee x-ray with no acute displaced fracture/dislocation, small/moderate sized joint effusion, advanced degenerative changes.  Urinalysis and urine culture: Pending CT left hip: Pending.  ED referred for admission given cellulitis and inability to walk.  Hospital Course / Discharge diagnoses: Principal Problem Bilateral knee wounds, cellulitis, present on admission -Patient presented from home following a fall a week PTA. She had been crawling on her knees for mobility, and developed significant eschar formation with surrounding cellulitic changes to bilateral knees, right heel blister. Patient was evaluated by orthopedics, no concern for septic arthritis, recommended ntibiotics and local wound care. Plastic surgery, Dr. Marla Roe consulted and she is status post surgical debridement on 3/2, plastic surgery evaluated patient and cleared for discharge.  She was maintained on ceftriaxone while hospitalized for her cellulitis, improving, will transition to doxycycline for 3 additional days following discharge.  Active Problems Mild acute transaminitis -Unclear etiology, resolved, acute hepatitis panel nonreactive, no abdominal symptoms Chronic right hip pain secondary to advanced osteoarthritis, moderate right knee effusion -X-rays bilateral hips and knees with no acute fracture or dislocation, moderate to severe osteoarthritis with right knee effusion. Left hip CT with no fracture/AVN but with moderate subcutaneous inflammation edema fluid versus hematoma. Seen by orthopedics, Dr. Griffin Basil, no concern for septic arthritis Generalized weakness, gait disturbance, debility -Daughter reported that usually at baseline able to ambulate with the use of a walker, however fell 1 week PTA, since had been crawling on the knees and unable to ambulate appropriately.  Patient has history of severe  osteoarthritis, had been told by her PCP, that she is not a candidate for operative management due to advanced dementia. Currently lives with daughter. Plan to discharge to [facility] SN[facility] for short-term rehab Advanced Alzheimer's dementia -Supportive care, not on any home medications Depression -Continue Effexor Pressure wounds PTA Left knee POA, right knee unstageable, full-thickness tissue loss, POA right heel samples blister, POA  Discharge Instructions   Allergies as of 07/29/2019      Reactions   Shellfish Allergy Anaphylaxis   Throat swells   Sulfa Antibiotics Anaphylaxis   Throat Swelling      Medication List    STOP taking these medications   fluticasone 50 MCG/ACT nasal spray Commonly known as: FLONASE   nystatin cream Commonly known as: MYCOSTATIN     TAKE these medications   acetaminophen 500 MG tablet Commonly known as: TYLENOL Take 1,000 mg by mouth every 6 (six) hours as needed for moderate pain.   albuterol 108 (90 Base) MCG/ACT inhaler Commonly known as: VENTOLIN HFA Inhale 2 puffs into the lungs every 6 (six) hours as needed for wheezing or shortness of breath.   doxycycline 100 MG tablet Commonly known as: ADOXA Take 1 tablet (100 mg total) by mouth 2 (two) times daily for 3 days.   HYDROcodone-acetaminophen 5-325 MG tablet Commonly known as: NORCO/VICODIN Take 1-2 tablets by mouth every 4 (four) hours as needed for moderate pain.   venlafaxine 75 MG tablet Commonly known as: EFFEXOR Take 1 tablet (75 mg total) by mouth 2 (two) times daily.       Contact information for follow-up providers    Dillingham, Loel Lofty, DO In 1 week.   Specialty: Plastic Surgery Contact information: [address] [place] [address] [phone]            Contact information for after-discharge care    Destination    [facility] .   Service: Skilled Nursing Contact information: [address] [place]  [place] [phone]                  Consultations:  Orthopedic surgery  Plastic surgery  Procedures/Studies: PROCEDURE:  1. Excision of nonviable skin and fat of left knee 4 x 6 cm 2. Excision of nonviable skin and fat of right knee 4 x 4 cm 3. Placement of Acell left knee 4 x 6 cm sheet and 500 mg powder 4. Placement of Acell right knee 4 x 4 cm sheet and 500 mg powder  CT Head Wo Contrast  Result Date: 07/21/2019 CLINICAL DATA:  Head trauma. Altered mental status. Failure to thrive. EXAM: CT HEAD WITHOUT CONTRAST TECHNIQUE: Contiguous axial images were obtained from the base of the skull through the vertex without intravenous contrast. COMPARISON:  04/25/2017 FINDINGS: Brain: There is progressive prominent volume loss in the left frontal lobe with milder volume loss in the right frontal lobe. Asymmetric volume loss is also present in the left temporal lobe. Hypodensities in the cerebral white matter bilaterally are similar to the prior study and nonspecific but compatible with mild chronic small vessel ischemic disease. There is no evidence of acute infarct, intracranial hemorrhage, mass, midline shift, or extra-axial fluid collection. Vascular: Calcified atherosclerosis at the skull base. No hyperdense vessel. Skull: No fracture or suspicious osseous lesion. Sinuses/Orbits: Mild mucosal thickening in the maxillary sinuses. Clear mastoid air cells. Unremarkable orbits. Other: None. IMPRESSION: 1. No evidence of acute intracranial abnormality. 2.  Progressive asymmetric atrophy involving the left frontal and temporal lobes. Electronically Signed   By: Logan Bores M.D.   On: 07/21/2019 16:06   CT Hip Left Wo Contrast  Result Date: 07/21/2019 CLINICAL DATA:  Poly trauma. Unable to walk. Left hip pain. EXAM: CT OF THE LEFT HIP WITHOUT CONTRAST TECHNIQUE: Multidetector CT imaging of the left hip was performed according to the standard protocol. Multiplanar CT image  reconstructions were also generated. COMPARISON:  Radiographs 07/21/2019 FINDINGS: The left hip is normally located. No hip fracture or AVN. The acetabulum is intact. No left-sided pelvic fractures are identified. The pubic symphysis and visualized left SI joint are intact. Moderate subcutaneous inflammation/edema/fluid or hematoma likely representing a direct contusion. No obvious intramuscular hematoma to suggest a significant muscle tear. Large amount of stool in the distended rectum. IMPRESSION: 1. No left hip fracture or AVN. 2. Moderate subcutaneous inflammation/edema/fluid or hematoma likely representing a direct contusion. No obvious intramuscular hematoma to suggest a significant muscle tear. 3. Large amount of stool in the distended rectum. Electronically Signed   By: Marijo Sanes M.D.   On: 07/21/2019 17:03   DG Knee Complete 4 Views Left  Result Date: 07/21/2019 CLINICAL DATA:  Pain. EXAM: LEFT KNEE - COMPLETE 4+ VIEW COMPARISON:  None. FINDINGS: There are advanced degenerative changes of the left knee, greatest within the medial and patellofemoral compartments. There is no acute displaced fracture. No dislocation. There is a small suprapatellar joint effusion. IMPRESSION: 1. No acute displaced fracture or dislocation. 2. Advanced degenerative changes of the left knee. Electronically Signed   By: Constance Holster M.D.   On: 07/21/2019 16:07   DG Knee Complete 4 Views Right  Result Date: 07/21/2019 CLINICAL DATA:  Pain EXAM: RIGHT KNEE - COMPLETE 4+ VIEW COMPARISON:  None. FINDINGS: There are advanced degenerative changes of the right knee, greatest within the medial and patellofemoral compartments. There is no acute displaced fracture. No dislocation. There is a small to moderate-sized joint effusion. IMPRESSION: 1. No acute displaced fracture or dislocation. 2. Small to moderate-sized joint effusion. 3. Advanced degenerative changes. Electronically Signed   By: Constance Holster M.D.   On:  07/21/2019 16:10   DG Hip Unilat With Pelvis 2-3 Views Left  Result Date: 07/21/2019 CLINICAL DATA:  Pain status post fall EXAM: DG HIP (WITH OR WITHOUT PELVIS) 2-3V LEFT COMPARISON:  None. FINDINGS: There are end-stage degenerative changes of the right hip. There is mild-to-moderate osteoarthritis of the left hip. No acute displaced fracture or dislocation. There is a moderate to large amount of stool at the level of the rectum. IMPRESSION: 1. No acute displaced fracture or dislocation. 2. Severe degenerative changes of the right hip. 3. Mild-to-moderate osteoarthritis of the left hip. Electronically Signed   By: Constance Holster M.D.   On: 07/21/2019 16:07      Subjective: -no complaints. Underlying dementia, calm, pleasant  Discharge Exam: BP (!) 107/56 (BP Location: Right Arm)   Pulse 91   Temp 98 F (36.7 C) (Oral)   Resp 16   Ht 5\' 2"  (1.575 m)   Wt 84 kg   SpO2 91%   BMI 33.87 kg/m   General: Pt is alert, awake, not in acute distress Cardiovascular: RRR, S1/S2 +, no rubs, no gallops Respiratory: CTA bilaterally, no wheezing, no rhonchi Abdominal: Soft, NT, ND, bowel sounds + Extremities: no edema, no cyanosis    The results of significant diagnostics from this hospitalization (including imaging, microbiology, ancillary and laboratory) are listed below for  reference.     Microbiology: Recent Results (from the past 240 hour(s))  Urine culture     Status: None   Collection Time: 07/21/19  2:31 PM   Specimen: Urine, Clean Catch  Result Value Ref Range Status   Specimen Description   Final    URINE, CLEAN CATCH Performed at [facility], [place] [address], [place], [place] [address]    Special Requests   Final    NONE Performed at [facility], [place] [address] [place], [place] [address]    Culture   Final    NO GROWTH Performed at [facility], [place] [address], [place], [place] [address]    Report Status 07/23/2019 FINAL   Final  SARS CORONAVIRUS 2 (TAT 6-24 HRS) Nasopharyngeal Nasopharyngeal Swab     Status: None   Collection Time: 07/21/19  5:10 PM   Specimen: Nasopharyngeal Swab  Result Value Ref Range Status   SARS Coronavirus 2 NEGATIVE NEGATIVE Final    Comment: (NOTE) SARS-CoV-2 target nucleic acids are NOT DETECTED. The SARS-CoV-2 RNA is generally detectable in upper and lower respiratory specimens during the acute phase of infection. Negative results do not preclude SARS-CoV-2 infection, do not rule out co-infections with other pathogens, and should not be used as the sole basis for treatment or other patient management decisions. Negative results must be combined with clinical observations, patient history, and epidemiological information. The expected result is Negative. Fact Sheet for Patients: SugarRoll.be Fact Sheet for Healthcare Providers: https://www.woods-mathews.com/ This test is not yet approved or cleared by the [place] FDA and  has been authorized for detection and/or diagnosis of SARS-CoV-2 by FDA under an Emergency Use Authorization (EUA). This EUA will remain  in effect (meaning this test can be used) for the duration of the COVID-19 declaration under Section 56 4(b)(1) of the Act, 21 U.S.C. section 360bbb-3(b)(1), unless the authorization is terminated or revoked sooner. Performed at [facility], [place] [address], [place], [place] [address]   Surgical PCR screen     Status: None   Collection Time: 07/27/19  9:08 AM   Specimen: Nasal Mucosa; Nasal Swab  Result Value Ref Range Status   MRSA, PCR NEGATIVE NEGATIVE Final   Staphylococcus aureus NEGATIVE NEGATIVE Final    Comment: (NOTE) The Xpert SA Assay (FDA approved for NASAL specimens in patients 12 years of age and older), is one component of a comprehensive surveillance program. It is not intended to diagnose infection nor to guide or monitor treatment. Performed at  [facility], [place] [address] [place], [place] [address]   SARS CORONAVIRUS 2 (TAT 6-24 HRS) Nasopharyngeal Nasopharyngeal Swab     Status: None   Collection Time: 07/28/19 10:43 AM   Specimen: Nasopharyngeal Swab  Result Value Ref Range Status   SARS Coronavirus 2 NEGATIVE NEGATIVE Final    Comment: (NOTE) SARS-CoV-2 target nucleic acids are NOT DETECTED. The SARS-CoV-2 RNA is generally detectable in upper and lower respiratory specimens during the acute phase of infection. Negative results do not preclude SARS-CoV-2 infection, do not rule out co-infections with other pathogens, and should not be used as the sole basis for treatment or other patient management decisions. Negative results must be combined with clinical observations, patient history, and epidemiological information. The expected result is Negative. Fact Sheet for Patients: SugarRoll.be Fact Sheet for Healthcare Providers: https://www.woods-mathews.com/ This test is not yet approved or cleared by the [place] FDA and  has been authorized for  detection and/or diagnosis of SARS-CoV-2 by FDA under an Emergency Use Authorization (EUA). This EUA will remain  in effect (meaning this test can be used) for the duration of the COVID-19 declaration under Section 56 4(b)(1) of the Act, 21 U.S.C. section 360bbb-3(b)(1), unless the authorization is terminated or revoked sooner. Performed at [facility], [place] [address] [place], [place] [address]      Labs: Basic Metabolic Panel: Recent Labs  Lab 07/23/19 0353 07/24/19 0354 07/26/19 0437 07/27/19 0343 07/28/19 0403  NA 138 136 135 135 134*  K 4.0 4.3 3.9 3.8 3.9  CL 104 99 99 99 98  CO2 26 29 29 27 29   GLUCOSE 134* 97 114* 114* 119*  BUN 20 17 18 23 16   CREATININE 0.52 0.53 0.49 0.51 0.44  CALCIUM 8.0* 8.2* 8.0* 8.0* 8.1*   Liver Function Tests: Recent Labs  Lab 07/23/19 0353 07/24/19 0354    AST 38 38  ALT 22 23  ALKPHOS 50 53  BILITOT 0.5 0.5  PROT 5.8* 5.9*  ALBUMIN 2.7* 2.6*   CBC: Recent Labs  Lab 07/23/19 0353 07/24/19 0354 07/26/19 0437 07/27/19 0343 07/28/19 0403  WBC 6.1 8.0 7.9 8.1 7.4  HGB 11.5* 12.2 11.0* 11.4* 11.5*  HCT 34.9* 36.2 32.9* 34.8* 34.7*  MCV 98.9 98.4 98.8 100.6* 98.6  PLT 197 228 231 261 300   CBG: Recent Labs  Lab 07/27/19 1503 07/27/19 1707  GLUCAP 107* 120*   Hgb A1c No results for input(s): HGBA1C in the last 72 hours. Lipid Profile No results for input(s): CHOL, HDL, LDLCALC, TRIG, CHOLHDL, LDLDIRECT in the last 72 hours. Thyroid function studies No results for input(s): TSH, T4TOTAL, T3FREE, THYROIDAB in the last 72 hours.  Invalid input(s): FREET3 Urinalysis    Component Value Date/Time   COLORURINE YELLOW 07/21/2019 [place] 07/21/2019 1431   LABSPEC 1.010 07/21/2019 1431   PHURINE 6.0 07/21/2019 1431   GLUCOSEU NEGATIVE 07/21/2019 1431   HGBUR NEGATIVE 07/21/2019 1431   BILIRUBINUR NEGATIVE 07/21/2019 1431   KETONESUR NEGATIVE 07/21/2019 1431   PROTEINUR NEGATIVE 07/21/2019 1431   NITRITE NEGATIVE 07/21/2019 1431   LEUKOCYTESUR NEGATIVE 07/21/2019 1431    FURTHER DISCHARGE INSTRUCTIONS:   Get Medicines reviewed and adjusted: Please take all your medications with you for your next visit with your Primary MD   Laboratory/radiological data: Please request your Primary MD to go over all hospital tests and procedure/radiological results at the follow up, please ask your Primary MD to get all Hospital records sent to his/her office.   In some cases, they will be blood work, cultures and biopsy results pending at the time of your discharge. Please request that your primary care M.D. goes through all the records of your hospital data and follows up on these results.   Also Note the following: If you experience worsening of your admission symptoms, develop shortness of breath, life threatening  emergency, suicidal or homicidal thoughts you must seek medical attention immediately by calling 911 or calling your MD immediately  if symptoms less severe.   You must read complete instructions/literature along with all the possible adverse reactions/side effects for all the Medicines you take and that have been prescribed to you. Take any new Medicines after you have completely understood and accpet all the possible adverse reactions/side effects.    Do not drive when taking Pain medications or sleeping medications (Benzodaizepines)   Do not take more than prescribed Pain, Sleep and Anxiety Medications. It is  not advisable to combine anxiety,sleep and pain medications without talking with your primary care practitioner   Special Instructions: If you have smoked or chewed Tobacco  in the last 2 yrs please stop smoking, stop any regular Alcohol  and or any Recreational drug use.   Wear Seat belts while driving.   Please note: You were cared for by a hospitalist during your hospital stay. Once you are discharged, your primary care physician will handle any further medical issues. Please note that NO REFILLS for any discharge medications will be authorized once you are discharged, as it is imperative that you return to your primary care physician (or establish a relationship with a primary care physician if you do not have one) for your post hospital discharge needs so that they can reassess your need for medications and monitor your lab values.  Time coordinating discharge: 35 minutes  SIGNED:  Marzetta Board, MD, PhD 07/29/2019, 9:58 AM

## 2019-07-29 NOTE — Plan of Care (Signed)
Patient to discharge to [facility] for rehab, report called to [place], waiting on PTAR

## 2019-07-29 NOTE — TOC Transition Note (Addendum)
Transition of Care [facility]) - CM/SW Discharge Note   Patient Details  Name: Lacey Cook MRN: [id number] Date of Birth: 1956/05/26  [facility] CM/SW Contact:  Leeroy Cha, RN Phone Number: 07/29/2019, 10:10 AM   Clinical Narrative:    ready to go [facility], snf notified of negative covid repot/DC summary sent to [facility] via the hub. 5 daughter notified of the move states she is walking into the hospital now. 1057/transport packet with prescription placed in chart and floor rn made aware of.  Patient will go to [facility] .  The report number is [id number].  Note vis epic sent to rn with this information at 1058. 1244 ptar called for transport Final next level of care: [place] Barriers to Discharge: No Barriers Identified   Patient Goals and CMS Choice   CMS Medicare.gov Compare Post Acute Care list provided to:: Other (Comment Required)(Daughter) Choice offered to / list presented to : Adult Children  Discharge Placement                       Discharge Plan and Services In-house Referral: Clinical Social Work   Post Acute Care Choice: [place]                               Social Determinants of Health (SDOH) Interventions     Readmission Risk Interventions No flowsheet data found.

## 2019-08-06 ENCOUNTER — Ambulatory Visit (INDEPENDENT_AMBULATORY_CARE_PROVIDER_SITE_OTHER): Payer: Medicaid Other | Admitting: Plastic Surgery

## 2019-08-06 ENCOUNTER — Other Ambulatory Visit: Payer: Self-pay

## 2019-08-06 ENCOUNTER — Encounter: Payer: Self-pay | Admitting: Plastic Surgery

## 2019-08-06 VITALS — BP 125/81 | HR 83 | Temp 97.3°F

## 2019-08-06 DIAGNOSIS — L03119 Cellulitis of unspecified part of limb: Secondary | ICD-10-CM

## 2019-08-06 NOTE — Progress Notes (Signed)
   Subjective:    Patient ID: Lacey Cook, female    DOB: 17-Nov-1955, 64 y.o.   MRN: [id number]  Is a 64 year old female here with her transporter from the nursing facility.  She underwent excision of bilateral leg wounds 3/2 with ACell placement.  Today she has on a pair of sweatpants that are very tight of the ankles.  Fortunately we were able to get them pulled out to see the site.  She appears to be doing very well and the wound is healing.  I left the Adaptic in place and we will plan on removing that at her next visit.  There does not appear to be any cellulitis or malodor.  The patient does seem to be a bit confused and denied having any knee wounds.  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     Review of Systems  Constitutional: Negative for activity change.  Respiratory: Negative.   Cardiovascular: Negative.   Gastrointestinal: Negative.   Genitourinary: Negative.        Objective:   Physical Exam Vitals and nursing note reviewed.  Constitutional:      Appearance: Normal appearance.  Pulmonary:     Effort: Pulmonary effort is normal.  Abdominal:     General: Abdomen is flat.  Musculoskeletal:       Legs:  Skin:    General: Skin is warm.  Neurological:     Mental Status: She is disoriented.  Psychiatric:        Mood and Affect: Mood normal.       Assessment & Plan:     ICD-10-CM   1. Cellulitis of knee  L03.119     Continue with K-Y jelly to the knees.  We will see the patient back in 2 weeks.  She can clean the areas especially the periwound area that will be fine at this time.  There appears to be good incorporation of the ACell.

## 2019-08-17 ENCOUNTER — Encounter: Payer: Medicaid Other | Attending: Physician Assistant | Admitting: Physician Assistant

## 2019-08-17 ENCOUNTER — Other Ambulatory Visit: Payer: Self-pay

## 2019-08-17 DIAGNOSIS — E11622 Type 2 diabetes mellitus with other skin ulcer: Secondary | ICD-10-CM | POA: Diagnosis not present

## 2019-08-17 DIAGNOSIS — M6281 Muscle weakness (generalized): Secondary | ICD-10-CM | POA: Insufficient documentation

## 2019-08-17 DIAGNOSIS — F0280 Dementia in other diseases classified elsewhere without behavioral disturbance: Secondary | ICD-10-CM | POA: Insufficient documentation

## 2019-08-17 DIAGNOSIS — L89153 Pressure ulcer of sacral region, stage 3: Secondary | ICD-10-CM | POA: Diagnosis present

## 2019-08-17 DIAGNOSIS — I120 Hypertensive chronic kidney disease with stage 5 chronic kidney disease or end stage renal disease: Secondary | ICD-10-CM | POA: Diagnosis not present

## 2019-08-17 DIAGNOSIS — E1122 Type 2 diabetes mellitus with diabetic chronic kidney disease: Secondary | ICD-10-CM | POA: Diagnosis not present

## 2019-08-17 DIAGNOSIS — F329 Major depressive disorder, single episode, unspecified: Secondary | ICD-10-CM | POA: Insufficient documentation

## 2019-08-17 DIAGNOSIS — G308 Other Alzheimer's disease: Secondary | ICD-10-CM | POA: Diagnosis not present

## 2019-08-17 DIAGNOSIS — N186 End stage renal disease: Secondary | ICD-10-CM | POA: Diagnosis not present

## 2019-08-17 DIAGNOSIS — I252 Old myocardial infarction: Secondary | ICD-10-CM | POA: Insufficient documentation

## 2019-08-17 DIAGNOSIS — Z8673 Personal history of transient ischemic attack (TIA), and cerebral infarction without residual deficits: Secondary | ICD-10-CM | POA: Diagnosis not present

## 2019-08-17 NOTE — Progress Notes (Signed)
PATRISHA, HAUSMANN ([id number]) Visit Report for 08/17/2019 Abuse/Suicide Risk Screen Details Patient Name: Lacey Cook, Lacey Cook Date of Service: 08/17/2019 2:15 PM Medical Record Number: [id number] Patient Account Number: [id number] Date of Birth/Sex: 04-19-1956 (64 y.o. F) Treating RN: Army Melia Primary Care Asser Lucena: Doristine Mango Other Clinician: Referring Cammi Consalvo: Virgel Bouquet Treating Clayborn Milnes/Extender: Melburn Hake, HOYT Weeks in Treatment: 0 Abuse/Suicide Risk Screen Items Answer Notes Unable to answer dt dementia. Electronic Signature(s) Signed: 08/17/2019 4:13:28 PM By: Army Melia Entered By: Army Melia on 08/17/2019 14:59:44 Lacey Cook ([id number]) -------------------------------------------------------------------------------- Activities of Daily Living Details Patient Name: Lacey Cook Date of Service: 08/17/2019 2:15 PM Medical Record Number: [id number] Patient Account Number: [id number] Date of Birth/Sex: 11/23/1955 (63 y.o. F) Treating RN: Army Melia Primary Care Travious Vanover: Doristine Mango Other Clinician: Referring Zaydn Gutridge: Virgel Bouquet Treating Marnisha Stampley/Extender: Melburn Hake, HOYT Weeks in Treatment: 0 Activities of Daily Living Items Answer Activities of Daily Living (Please select one for each item) Drive Automobile Not Able Take Medications Not Able Use Telephone Not Able Care for Appearance Not Able Use Toilet Not Able Bath / Shower Not Able Dress Self Not Able Feed Self Not Able Walk Not Able Get In / Out Bed Not [place] Not Able Shop for Self Not Able Notes Unable to answer dt dementia. Electronic Signature(s) Signed: 08/17/2019 4:13:28 PM By: Army Melia Entered By: Army Melia on 08/17/2019 15:00:06 Lacey Cook ([id number]) -------------------------------------------------------------------------------- Education Screening Details Patient Name: Lacey Cook Date of Service:  08/17/2019 2:15 PM Medical Record Number: [id number] Patient Account Number: [id number] Date of Birth/Sex: 04/19/1956 (63 y.o. F) Treating RN: Army Melia Primary Care Alishba Naples: Doristine Mango Other Clinician: Referring Leighton Luster: Virgel Bouquet Treating Carrol Hougland/Extender: Sharalyn Ink in Treatment: 0 Learning Preferences/Education Level/Primary Language Preferred Language: English Knowledge/Comprehension Knowledge Level: Low Comprehension Level: Low Ability to understand written instructions: Low Ability to understand verbal instructions: Low Motivation Anxiety Level: Calm Cooperation: Cooperative Interest in Health Problems: Uninterested Perception: Confused Willingness to Engage in Self-Management Low Activities: Readiness to Engage in Self-Management Low Activities: Notes Unable to answer dt dementia. Electronic Signature(s) Signed: 08/17/2019 4:13:28 PM By: Army Melia Entered By: Army Melia on 08/17/2019 15:00:29 Lacey Cook ([id number]) -------------------------------------------------------------------------------- Fall Risk Assessment Details Patient Name: Lacey Cook Date of Service: 08/17/2019 2:15 PM Medical Record Number: [id number] Patient Account Number: [id number] Date of Birth/Sex: 1955-09-11 (63 y.o. F) Treating RN: Army Melia Primary Care Aniya Jolicoeur: Doristine Mango Other Clinician: Referring Trenity Pha: Virgel Bouquet Treating Deetta Siegmann/Extender: Melburn Hake, HOYT Weeks in Treatment: 0 Fall Risk Assessment Items Unable to Perform Assessment: Medical contraindication (finding) Notes Unable to answer dt dementia. Electronic Signature(s) Signed: 08/17/2019 4:13:28 PM By: Army Melia Entered By: Army Melia on 08/17/2019 15:00:43 Lacey Cook ([id number]) -------------------------------------------------------------------------------- Foot Assessment Details Patient Name: Lacey Cook Date of Service: 08/17/2019 2:15 PM Medical  Record Number: [id number] Patient Account Number: [id number] Date of Birth/Sex: 09/14/1955 (63 y.o. F) Treating RN: Army Melia Primary Care Tayshawn Purnell: Doristine Mango Other Clinician: Referring Teasia Zapf: Virgel Bouquet Treating Vannia Pola/Extender: Melburn Hake, HOYT Weeks in Treatment: 0 Foot Assessment Items [x]  Unable to perform due to altered mental status Site Locations + = Sensation present, - = Sensation absent, C = Callus, U = Ulcer R = Redness, W = Warmth, M = Maceration, PU = Pre-ulcerative lesion F = Fissure, S = Swelling, D = Dryness Assessment Right: Left: Other Deformity: No No Prior Foot Ulcer: No No Prior Amputation: No No Charcot Joint: No No Ambulatory Status: Non-ambulatory Assistance Device: Wheelchair Gait: Electronic  Signature(s) Signed: 08/17/2019 4:13:28 PM By: Army Melia Entered By: Army Melia on 08/17/2019 15:01:29 Lacey Cook ([id number]) -------------------------------------------------------------------------------- Nutrition Risk Screening Details Patient Name: Lacey Cook Date of Service: 08/17/2019 2:15 PM Medical Record Number: [id number] Patient Account Number: [id number] Date of Birth/Sex: April 21, 1956 (64 y.o. F) Treating RN: Army Melia Primary Care Curry Dulski: Doristine Mango Other Clinician: Referring Dymond Spreen: Virgel Bouquet Treating Samit Sylve/Extender: Melburn Hake, HOYT Weeks in Treatment: 0 Height (in): 64 Weight (lbs): 200 Body Mass Index (BMI): 34.3 Nutrition Risk Screening Items Score Screening NUTRITION RISK SCREEN: I have an illness or condition that made me change the kind and/or amount of food I eat 0 No I eat fewer than two meals per day 0 No I eat few fruits and vegetables, or milk products 0 No I have three or more drinks of beer, liquor or wine almost every day 0 No I have tooth or mouth problems that make it hard for me to eat 0 No I don't always have enough money to buy the food I need 0 No I eat alone most of the  time 0 No I take three or more different prescribed or over-the-counter drugs a day 1 Yes Without wanting to, I have lost or gained 10 pounds in the last six months 0 No I am not always physically able to shop, cook and/or feed myself 0 No Nutrition Protocols Good Risk Protocol Provide education on elevated Moderate Risk Protocol 0 blood sugars and impact on wound healing, as applicable High Risk Proctocol Risk Level: Good Risk Score: 1 Electronic Signature(s) Signed: 08/17/2019 4:13:28 PM By: Army Melia Entered By: Army Melia on 08/17/2019 15:01:15

## 2019-08-17 NOTE — Progress Notes (Signed)
Lacey Cook ([id number]) Visit Report for 08/17/2019 Allergy List Details Patient Name: Lacey Cook Date of Service: 08/17/2019 2:15 PM Medical Record Number: [id number] Patient Account Number: [id number] Date of Birth/Sex: [birth year] (64 y.o. F) Treating RN: Army Melia Primary Care Jameson Morrow: Doristine Mango Other Clinician: Referring Jakayla Schweppe: Virgel Bouquet Treating Rafeal Skibicki/Extender: Melburn Hake, HOYT Weeks in Treatment: 0 Allergies Active Allergies codeine Sulfa (Sulfonamide Antibiotics) Allergy Notes Electronic Signature(s) Signed: 08/17/2019 4:13:28 PM By: Army Melia Entered By: Army Melia on 08/17/2019 14:53:01 Lacey Cook ([id number]) -------------------------------------------------------------------------------- Arrival Information Details Patient Name: Lacey Cook Date of Service: 08/17/2019 2:15 PM Medical Record Number: [id number] Patient Account Number: [id number] Date of Birth/Sex: [birth year] (64 y.o. F) Treating RN: Army Melia Primary Care Kordel Leavy: Doristine Mango Other Clinician: Referring Yarithza Mink: Virgel Bouquet Treating Yoltzin Ransom/Extender: Sharalyn Ink in Treatment: 0 Visit Information Patient Arrived: Wheel Chair Arrival Time: 14:47 Accompanied By: self Transfer Assistance: Stormy Fabian Patient Identification Verified: Yes Electronic Signature(s) Signed: 08/17/2019 4:13:28 PM By: Army Melia Entered By: Army Melia on 08/17/2019 14:47:25 Lacey Cook ([id number]) -------------------------------------------------------------------------------- Clinic Level of Care Assessment Details Patient Name: Lacey Cook Date of Service: 08/17/2019 2:15 PM Medical Record Number: [id number] Patient Account Number: [id number] Date of Birth/Sex: December 24, 1955 (64 y.o. F) Treating RN: Montey Hora Primary Care Darus Hershman: Doristine Mango Other Clinician: Referring Braxson Hollingsworth: Virgel Bouquet Treating Nolin Grell/Extender: Melburn Hake, HOYT Weeks in  Treatment: 0 Clinic Level of Care Assessment Items TOOL 1 Quantity Score []  - Use when EandM and Procedure is performed on INITIAL visit 0 ASSESSMENTS - Nursing Assessment / Reassessment X - General Physical Exam (combine w/ comprehensive assessment (listed just below) when performed on new pt. 1 20 evals) X- 1 25 Comprehensive Assessment (HX, ROS, Risk Assessments, Wounds Hx, etc.) ASSESSMENTS - Wound and Skin Assessment / Reassessment []  - Dermatologic / Skin Assessment (not related to wound area) 0 ASSESSMENTS - Ostomy and/or Continence Assessment and Care []  - Incontinence Assessment and Management 0 []  - 0 Ostomy Care Assessment and Management (repouching, etc.) PROCESS - Coordination of Care X - Simple Patient / Family Education for ongoing care 1 15 []  - 0 Complex (extensive) Patient / Family Education for ongoing care X- 1 10 Staff obtains Programmer, systems, Records, Test Results / Process Orders []  - 0 Staff telephones HHA, Nursing Homes / Clarify orders / etc []  - 0 Routine Transfer to another Facility (non-emergent condition) []  - 0 Routine Hospital Admission (non-emergent condition) X- 1 15 New Admissions / Biomedical engineer / Ordering NPWT, Apligraf, etc. []  - 0 Emergency Hospital Admission (emergent condition) PROCESS - Special Needs []  - Pediatric / Minor Patient Management 0 []  - 0 Isolation Patient Management []  - 0 Hearing / Language / Visual special needs []  - 0 Assessment of Community assistance (transportation, D/C planning, etc.) X- 1 15 Additional assistance / Altered mentation []  - 0 Support Surface(s) Assessment (bed, cushion, seat, etc.) INTERVENTIONS - Miscellaneous []  - External ear exam 0 X- 1 10 Patient Transfer (multiple staff / Civil Service fast streamer / Similar devices) []  - 0 Simple Staple / Suture removal (25 or less) []  - 0 Complex Staple / Suture removal (26 or more) []  - 0 Hypo/Hyperglycemic Management (do not check if billed  separately) [name], [name] ([id number]) []  - 0 Ankle / Brachial Index (ABI) - do not check if billed separately Has the patient been seen at the hospital within the last three years: Yes Total Score: 110 Level Of Care: New/Established - Level 3 Electronic Signature(s) Signed: 08/17/2019 4:41:39 PM By: Montey Hora Entered  By: Montey Hora on 08/17/2019 15:40:10 Lacey Cook ([id number]) -------------------------------------------------------------------------------- Encounter Discharge Information Details Patient Name: Lacey Cook Date of Service: 08/17/2019 2:15 PM Medical Record Number: [id number] Patient Account Number: [id number] Date of Birth/Sex: Apr 20, 1956 (64 y.o. F) Treating RN: Montey Hora Primary Care Dedric Ethington: Doristine Mango Other Clinician: Referring Deontre Allsup: Virgel Bouquet Treating Signe Tackitt/Extender: Melburn Hake, HOYT Weeks in Treatment: 0 Encounter Discharge Information Items Post Procedure Vitals Discharge Condition: Stable Temperature (F): 98.1 Ambulatory Status: Wheelchair Pulse (bpm): 105 Discharge Destination: [place] Respiratory Rate (breaths/min): 16 Telephoned: No Blood Pressure (mmHg): 159/82 Orders Sent: Yes Transportation: Private Auto Accompanied By: caregiver Schedule Follow-up Appointment: Yes Clinical Summary of Care: Electronic Signature(s) Signed: 08/17/2019 4:41:39 PM By: Montey Hora Entered By: Montey Hora on 08/17/2019 15:41:42 Lacey Cook ([id number]) -------------------------------------------------------------------------------- Lower Extremity Assessment Details Patient Name: Lacey Cook Date of Service: 08/17/2019 2:15 PM Medical Record Number: [id number] Patient Account Number: [id number] Date of Birth/Sex: January 18, 1956 (64 y.o. F) Treating RN: Army Melia Primary Care Jimmey Hengel: Doristine Mango Other Clinician: Referring Quran Vasco: Virgel Bouquet Treating Christabell Loseke/Extender: Melburn Hake,  HOYT Weeks in Treatment: 0 Electronic Signature(s) Signed: 08/17/2019 2:59:38 PM By: Army Melia Entered By: Army Melia on 08/17/2019 14:59:37 Lacey Cook ([id number]) -------------------------------------------------------------------------------- Multi Wound Chart Details Patient Name: Lacey Cook Date of Service: 08/17/2019 2:15 PM Medical Record Number: [id number] Patient Account Number: [id number] Date of Birth/Sex: 05/06/1956 (63 y.o. F) Treating RN: Montey Hora Primary Care Jamesia Linnen: Doristine Mango Other Clinician: Referring Nykia Turko: Virgel Bouquet Treating Indyah Saulnier/Extender: Melburn Hake, HOYT Weeks in Treatment: 0 Vital Signs Height(in): 64 Pulse(bpm): 105 Weight(lbs): 200 Blood Pressure(mmHg): 159/82 Body Mass Index(BMI): 34 Temperature(F): 98.1 Respiratory Rate(breaths/min): 16 Photos: [N/A:N/A] Wound Location: Sacrum - Midline N/A N/A Wounding Event: Gradually Appeared N/A N/A Primary Etiology: Pressure Ulcer N/A N/A Date Acquired: 06/28/2019 N/A N/A Weeks of Treatment: 0 N/A N/A Wound Status: Open N/A N/A Measurements L x W x D (cm) 1.1x0.4x0.2 N/A N/A Area (cm) : 0.346 N/A N/A Volume (cm) : 0.069 N/A N/A Classification: Category/Stage III N/A N/A Exudate Amount: Medium N/A N/A Exudate Type: Serosanguineous N/A N/A Exudate Color: red, brown N/A N/A Granulation Amount: Large (67-100%) N/A N/A Granulation Quality: Red N/A N/A Necrotic Amount: Small (1-33%) N/A N/A Exposed Structures: Fat Layer (Subcutaneous Tissue) N/A N/A Exposed: Yes Fascia: No Tendon: No Muscle: No Joint: No Bone: No Epithelialization: None N/A N/A Treatment Notes Electronic Signature(s) Signed: 08/17/2019 4:41:39 PM By: Montey Hora Entered By: Montey Hora on 08/17/2019 15:11:33 Lacey Cook ([id number]) -------------------------------------------------------------------------------- Multi-Disciplinary Care Plan Details Patient Name: Lacey Cook Date of  Service: 08/17/2019 2:15 PM Medical Record Number: [id number] Patient Account Number: [id number] Date of Birth/Sex: 10/07/1955 (64 y.o. F) Treating RN: Montey Hora Primary Care Christine Morton: Doristine Mango Other Clinician: Referring Audra Bellard: Virgel Bouquet Treating Mani Celestin/Extender: Melburn Hake, HOYT Weeks in Treatment: 0 Active Inactive Abuse / Safety / Falls / Self Care Management Nursing Diagnoses: Potential for falls Goals: Patient will remain injury free related to falls Date Initiated: 08/17/2019 Target Resolution Date: 10/30/2019 Goal Status: Active Interventions: Assess fall risk on admission and as needed Notes: Orientation to the Wound Care Program Nursing Diagnoses: Knowledge deficit related to the wound healing center program Goals: Patient/caregiver will verbalize understanding of the [place] Date Initiated: 08/17/2019 Target Resolution Date: 10/30/2019 Goal Status: Active Interventions: Provide education on orientation to the wound center Notes: Pressure Nursing Diagnoses: Potential for impaired tissue integrity related to pressure, friction, moisture, and shear Goals: Patient will remain free of pressure ulcers Date Initiated: 08/17/2019 Target Resolution Date:  10/30/2019 Goal Status: Active Interventions: Provide education on pressure ulcers Notes: Wound/Skin Impairment Nursing Diagnoses: Impaired tissue integrity Rashid, Allizon ([id number]) Goals: Ulcer/skin breakdown will heal within 14 weeks Date Initiated: 08/17/2019 Target Resolution Date: 10/30/2019 Goal Status: Active Interventions: Assess patient/caregiver ability to obtain necessary supplies Assess patient/caregiver ability to perform ulcer/skin care regimen upon admission and as needed Assess ulceration(s) every visit Notes: Electronic Signature(s) Signed: 08/17/2019 4:41:39 PM By: Montey Hora Entered By: Montey Hora on 08/17/2019 15:11:22 Lacey Cook  ([id number]) -------------------------------------------------------------------------------- Pain Assessment Details Patient Name: Lacey Cook Date of Service: 08/17/2019 2:15 PM Medical Record Number: [id number] Patient Account Number: [id number] Date of Birth/Sex: 03-04-1956 (64 y.o. F) Treating RN: Army Melia Primary Care Jevaun Strick: Doristine Mango Other Clinician: Referring Keene Gilkey: Virgel Bouquet Treating Lucia Mccreadie/Extender: Melburn Hake, HOYT Weeks in Treatment: 0 Active Problems Location of Pain Severity and Description of Pain Patient Has Paino No Site Locations Pain Management and Medication Current Pain Management: Electronic Signature(s) Signed: 08/17/2019 4:13:28 PM By: Army Melia Entered By: Army Melia on 08/17/2019 14:47:41 Lacey Cook ([id number]) -------------------------------------------------------------------------------- Patient/Caregiver Education Details Patient Name: Lacey Cook Date of Service: 08/17/2019 2:15 PM Medical Record Number: [id number] Patient Account Number: [id number] Date of Birth/Gender: 1955-11-15 (63 y.o. F) Treating RN: Montey Hora Primary Care Physician: Doristine Mango Other Clinician: Referring Physician: Virgel Bouquet Treating Physician/Extender: Sharalyn Ink in Treatment: 0 Education Assessment Education Provided To: Caregiver SNF nurses Education Topics Provided Wound/Skin Impairment: Handouts: Other: signed wound care orders Methods: Printed Electronic Signature(s) Signed: 08/17/2019 4:41:39 PM By: Montey Hora Entered By: Montey Hora on 08/17/2019 15:40:34 Lacey Cook ([id number]) -------------------------------------------------------------------------------- Wound Assessment Details Patient Name: Lacey Cook Date of Service: 08/17/2019 2:15 PM Medical Record Number: [id number] Patient Account Number: [id number] Date of Birth/Sex: 1955/07/11 (63 y.o. F) Treating RN: Army Melia Primary  Care Corbitt Cloke: Doristine Mango Other Clinician: Referring Britton Bera: Virgel Bouquet Treating Jaxyn Mestas/Extender: Melburn Hake, HOYT Weeks in Treatment: 0 Wound Status Wound Number: 1 Primary Etiology: Pressure Ulcer Wound Location: Sacrum - Midline Wound Status: Open Wounding Event: Gradually Appeared Date Acquired: 06/28/2019 Weeks Of Treatment: 0 Clustered Wound: No Photos Wound Measurements Length: (cm) 1.1 % Redu Width: (cm) 0.4 % Redu Depth: (cm) 0.2 Epithe Area: (cm) 0.346 [place] Volume: (cm) 0.069 Under ction in Area: ction in Volume: lialization: None ling: No mining: No Wound Description Classification: Category/Stage III Foul O Exudate Amount: Medium Slough Exudate Type: Serosanguineous Exudate Color: red, brown dor After Cleansing: No /Fibrino Yes Wound Bed Granulation Amount: Large (67-100%) Exposed Structure Granulation Quality: Red Fascia Exposed: No Necrotic Amount: Small (1-33%) Fat Layer (Subcutaneous Tissue) Exposed: Yes Necrotic Quality: Adherent Slough Tendon Exposed: No Muscle Exposed: No Joint Exposed: No Bone Exposed: No Treatment Notes Wound #1 (Midline Sacrum) Notes prisma, BFD Electronic Signature(s) Signed: 08/17/2019 2:59:26 PM By: Jerolyn Shin, Ayannah ([id number]) Entered By: Army Melia on 08/17/2019 14:59:25 Lacey Cook ([id number]) -------------------------------------------------------------------------------- Vitals Details Patient Name: Lacey Cook Date of Service: 08/17/2019 2:15 PM Medical Record Number: [id number] Patient Account Number: [id number] Date of Birth/Sex: December 23, 1955 (64 y.o. F) Treating RN: Army Melia Primary Care Suraya Vidrine: Doristine Mango Other Clinician: Referring Dezmond Downie: Virgel Bouquet Treating Lizmarie Witters/Extender: Melburn Hake, HOYT Weeks in Treatment: 0 Vital Signs Time Taken: 14:47 Temperature (F): 98.1 Height (in): 64 Pulse (bpm): 105 Source: Stated Respiratory Rate (breaths/min):  16 Weight (lbs): 200 Blood Pressure (mmHg): 159/82 Source: Stated Reference Range: 80 - 120 mg / dl Body Mass Index (BMI): 34.3 Electronic Signature(s) Signed: 08/17/2019 4:13:28 PM By: Army Melia Entered By: Army Melia on 08/17/2019  14:48:24 

## 2019-08-19 NOTE — Progress Notes (Signed)
TERASA, ORSINI ([id number]) Visit Report for 08/17/2019 Chief Complaint Document Details Patient Name: Lacey Cook, Lacey Cook Date of Service: 08/17/2019 2:15 PM Medical Record Number: [id number] Patient Account Number: [id number] Date of Birth/Sex: 1956-02-26 (64 y.o. F) Treating RN: Montey Hora Primary Care Provider: Doristine Mango Other Clinician: Referring Provider: Virgel Bouquet Treating Provider/Extender: Melburn Hake, Asani Deniston Weeks in Treatment: 0 Information Obtained from: Patient Chief Complaint Sacral pressure ulcer Electronic Signature(s) Signed: 08/17/2019 3:07:57 PM By: Worthy Keeler PA-C Entered By: Worthy Keeler on 08/17/2019 15:07:56 Lacey Cook ([id number]) -------------------------------------------------------------------------------- Debridement Details Patient Name: Lacey Cook Date of Service: 08/17/2019 2:15 PM Medical Record Number: [id number] Patient Account Number: [id number] Date of Birth/Sex: 09/20/1955 (63 y.o. F) Treating RN: Montey Hora Primary Care Provider: Doristine Mango Other Clinician: Referring Provider: Virgel Bouquet Treating Provider/Extender: Melburn Hake, Dacota Devall Weeks in Treatment: 0 Debridement Performed for Wound #1 Midline Sacrum Assessment: Performed By: Physician STONE III, Quetzalli Clos E., PA-C Debridement Type: Debridement Level of Consciousness (Pre- Awake and Alert procedure): Pre-procedure Verification/Time Out Yes - 15:11 Taken: Start Time: 15:11 Pain Control: Lidocaine 4% Topical Solution Total Area Debrided (L x W): 1.1 (cm) x 0.4 (cm) = 0.44 (cm) Tissue and other material debrided: Viable, Non-Viable, Slough, Subcutaneous, Slough Level: Skin/Subcutaneous Tissue Debridement Description: Excisional Instrument: Curette Bleeding: Minimum Hemostasis Achieved: Pressure End Time: 15:13 Procedural Pain: 0 Post Procedural Pain: 0 Response to Treatment: Procedure was tolerated well Level of Consciousness (Post- Awake and  Alert procedure): Post Debridement Measurements of Total Wound Length: (cm) 1.1 Stage: Category/Stage III Width: (cm) 0.4 Depth: (cm) 0.3 Volume: (cm) 0.104 Character of Wound/Ulcer Post Debridement: Improved Post Procedure Diagnosis Same as Pre-procedure Electronic Signature(s) Signed: 08/17/2019 4:41:39 PM By: Montey Hora Signed: 08/18/2019 5:56:47 PM By: Worthy Keeler PA-C Entered By: Montey Hora on 08/17/2019 15:13:33 Lacey Cook ([id number]) -------------------------------------------------------------------------------- HPI Details Patient Name: Lacey Cook Date of Service: 08/17/2019 2:15 PM Medical Record Number: [id number] Patient Account Number: [id number] Date of Birth/Sex: 02-03-1956 (63 y.o. F) Treating RN: Montey Hora Primary Care Provider: Doristine Mango Other Clinician: Referring Provider: Virgel Bouquet Treating Provider/Extender: Melburn Hake, Amberlea Spagnuolo Weeks in Treatment: 0 History of Present Illness HPI Description: 08/17/2019 upon evaluation today patient presents with a midline sacral ulcer. Unfortunately we really do not have a lot of history actually did speak during the point of obtaining consent to see the patient with the patient's daughter by way of telephone Di Kindle was on the phone with me and we obtained verbal consent for treatment and debridement if necessary. With that being said unfortunately when the triage nurse was gathering information from the daughter getting the patient checked again there really was not a lot in the way of medical history that we were able to obtain most of the medical history was obtained from the patient's record from the skilled nursing facility. Her daughter did not even realize that there was a wound on the sacral region which is what we are actually seeing her for. The only thing she knew about where the wounds on her knees which are actually being managed by a separate provider. The patient does have a history  of generalized muscle weakness, diabetes mellitus type 2, and Alzheimer's disease. I really have no idea exactly when the sacral wound began. Electronic Signature(s) Signed: 08/18/2019 5:45:31 PM By: Worthy Keeler PA-C Entered By: Worthy Keeler on 08/18/2019 17:45:31 Lacey Cook ([id number]) -------------------------------------------------------------------------------- Physical Exam Details Patient Name: Lacey Cook Date of Service: 08/17/2019 2:15 PM Medical Record Number: [id number] Patient Account Number: [id number]  Date of Birth/Sex: 1955-09-29 (64 y.o. F) Treating RN: Montey Hora Primary Care Provider: Doristine Mango Other Clinician: Referring Provider: Virgel Bouquet Treating Provider/Extender: Melburn Hake, Shakthi Scipio Weeks in Treatment: 0 Constitutional patient is hypertensive.. pulse regular and within target range for patient.Marland Kitchen respirations regular, non-labored and within target range for patient.Marland Kitchen temperature within target range for patient.. Well-nourished and well-hydrated in no acute distress. Eyes conjunctiva clear no eyelid edema noted. pupils equal round and reactive to light and accommodation. Ears, Nose, Mouth, and Throat no gross abnormality of ear auricles or external auditory canals. normal hearing noted during conversation. mucus membranes moist. Respiratory normal breathing without difficulty. Musculoskeletal Patient unable to walk without assistance. Psychiatric Patient is not able to cooperate in decision making regarding care. Patient has dementia. patient is confused. Notes Upon inspection patient's wound in the sacral region did have some slough noted at this point which I was able to mechanically debride some but further sharp debridement was necessary to clear away the slough and all. This was much more affected and post debridement wound bed appears to be doing excellent which is great news. Electronic Signature(s) Signed: 08/18/2019 5:46:11 PM  By: Worthy Keeler PA-C Entered By: Worthy Keeler on 08/18/2019 17:46:11 Lacey Cook ([id number]) -------------------------------------------------------------------------------- Physician Orders Details Patient Name: Lacey Cook Date of Service: 08/17/2019 2:15 PM Medical Record Number: [id number] Patient Account Number: [id number] Date of Birth/Sex: 06/08/1955 (63 y.o. F) Treating RN: Montey Hora Primary Care Provider: Doristine Mango Other Clinician: Referring Provider: Virgel Bouquet Treating Provider/Extender: Melburn Hake, Tahlia Deamer Weeks in Treatment: 0 Verbal / Phone Orders: No Diagnosis Coding ICD-10 Coding Code Description L89.153 Pressure ulcer of sacral region, stage 3 M62.81 Muscle weakness (generalized) E11.622 Type 2 diabetes mellitus with other skin ulcer G30.8 Other Alzheimer's disease Wound Cleansing Wound #1 Midline Sacrum o Clean wound with Normal Saline. o May Shower, gently pat wound dry prior to applying new dressing. Primary Wound Dressing Wound #1 Midline Sacrum o Silver Collagen Secondary Dressing Wound #1 Midline Sacrum o Boardered Foam Dressing Dressing Change Frequency Wound #1 Midline Sacrum o Change Dressing Monday, Wednesday, Friday - SNF nurses to verify dressing integrity every shift and replace if needed Follow-up Appointments o Return Appointment in 2 weeks. Off-Loading Wound #1 Midline Sacrum o Turn and reposition every 2 hours Electronic Signature(s) Signed: 08/17/2019 4:41:39 PM By: Montey Hora Signed: 08/18/2019 5:56:47 PM By: Worthy Keeler PA-C Entered By: Montey Hora on 08/17/2019 15:14:58 Lacey Cook ([id number]) -------------------------------------------------------------------------------- Problem List Details Patient Name: Lacey Cook Date of Service: 08/17/2019 2:15 PM Medical Record Number: [id number] Patient Account Number: [id number] Date of Birth/Sex: [birth year] (63 y.o. F) Treating RN:  Montey Hora Primary Care Provider: Doristine Mango Other Clinician: Referring Provider: Virgel Bouquet Treating Provider/Extender: Melburn Hake, Jabar Krysiak Weeks in Treatment: 0 Active Problems ICD-10 Evaluated Encounter Code Description Active Date Today Diagnosis L89.153 Pressure ulcer of sacral region, stage 3 08/17/2019 No Yes M62.81 Muscle weakness (generalized) 08/17/2019 No Yes E11.622 Type 2 diabetes mellitus with other skin ulcer 08/17/2019 No Yes G30.8 Other Alzheimer's disease 08/17/2019 No Yes Inactive Problems Resolved Problems Electronic Signature(s) Signed: 08/17/2019 3:07:41 PM By: Worthy Keeler PA-C Entered By: Worthy Keeler on 08/17/2019 15:07:40 Lacey Cook ([id number]) -------------------------------------------------------------------------------- Progress Note Details Patient Name: Lacey Cook Date of Service: 08/17/2019 2:15 PM Medical Record Number: [id number] Patient Account Number: [id number] Date of Birth/Sex: [birth year] (63 y.o. F) Treating RN: Montey Hora Primary Care Provider: Doristine Mango Other Clinician: Referring Provider: Virgel Bouquet Treating Provider/Extender: Melburn Hake, Filippa Yarbough Weeks in  Treatment: 0 Subjective Chief Complaint Information obtained from Patient Sacral pressure ulcer History of Present Illness (HPI) 08/17/2019 upon evaluation today patient presents with a midline sacral ulcer. Unfortunately we really do not have a lot of history actually did speak during the point of obtaining consent to see the patient with the patient's daughter by way of telephone Di Kindle was on the phone with me and we obtained verbal consent for treatment and debridement if necessary. With that being said unfortunately when the triage nurse was gathering information from the daughter getting the patient checked again there really was not a lot in the way of medical history that we were able to obtain most of the medical history was obtained from the  patient's record from the skilled nursing facility. Her daughter did not even realize that there was a wound on the sacral region which is what we are actually seeing her for. The only thing she knew about where the wounds on her knees which are actually being managed by a separate provider. The patient does have a history of generalized muscle weakness, diabetes mellitus type 2, and Alzheimer's disease. I really have no idea exactly when the sacral wound began. Patient History Unable to Obtain Patient History due to Dementia. Allergies codeine, Sulfa (Sulfonamide Antibiotics) Family History No family history of Cancer. 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 Medical And Surgical History Notes Constitutional Symptoms (General Health) 2 strokes and heart attack Type II Diabetes Psychiatric Depression Review of Systems (ROS) Constitutional Symptoms (General Health) Denies complaints or symptoms of Fatigue, Fever, Chills, Marked Weight Change. Eyes Denies complaints or symptoms of Dry Eyes, Vision Changes, Glasses / Contacts. Ear/Nose/Mouth/Throat Denies complaints or symptoms of Difficult clearing ears, Sinusitis. Hematologic/Lymphatic Denies complaints or symptoms of Bleeding / Clotting Disorders, Human Immunodeficiency Virus. Respiratory Denies complaints or symptoms of Chronic or frequent coughs, Shortness of Breath. Neurologic Denies complaints or symptoms of Numbness/parasthesias, Focal/Weakness. Lacey Cook, Lacey Cook ([id number]) Objective Constitutional patient is hypertensive.. pulse regular and within target range for patient.Marland Kitchen respirations regular, non-labored and within target range for patient.Marland Kitchen temperature within target range for patient.. Well-nourished and well-hydrated in no acute  distress. Vitals Time Taken: 2:47 PM, Height: 64 in, Source: Stated, Weight: 200 lbs, Source: Stated, BMI: 34.3, Temperature: 98.1 F, Pulse: 105 bpm, Respiratory Rate: 16 breaths/min, Blood Pressure: 159/82 mmHg. Eyes conjunctiva clear no eyelid edema noted. pupils equal round and reactive to light and accommodation. Ears, Nose, Mouth, and Throat no gross abnormality of ear auricles or external auditory canals. normal hearing noted during conversation. mucus membranes moist. Respiratory normal breathing without difficulty. Musculoskeletal Patient unable to walk without assistance. Psychiatric Patient is not able to cooperate in decision making regarding care. Patient has dementia. patient is confused. General Notes: Upon inspection patient's wound in the sacral region did have some slough noted at this point which I was able to mechanically debride some but further sharp debridement was necessary to clear away the slough and all. This was much more affected and post debridement wound bed appears to be doing excellent which is great news. Integumentary (Hair, Skin) Wound #1 status is Open. Original cause of wound was Gradually Appeared. The wound is located on the Midline Sacrum. The wound measures 1.1cm length x 0.4cm width x 0.2cm depth; 0.346cm^2 area and 0.069cm^3 volume. There is Fat Layer (Subcutaneous Tissue)  Exposed exposed. There is no tunneling or undermining noted. There is a medium amount of serosanguineous drainage noted. There is large (67-100%) red granulation within the wound bed. There is a small (1-33%) amount of necrotic tissue within the wound bed including Adherent Slough. Assessment Active Problems ICD-10 Pressure ulcer of sacral region, stage 3 Muscle weakness (generalized) Type 2 diabetes mellitus with other skin ulcer Other Alzheimer's disease Procedures Wound #1 Pre-procedure diagnosis of Wound #1 is a Pressure Ulcer located on the Midline Sacrum . There was a  Excisional Skin/Subcutaneous Tissue Debridement with a total area of 0.44 sq cm performed by STONE III, Swade Shonka E., PA-C. With the following instrument(s): Curette to remove Viable and Non-Viable tissue/material. Material removed includes Subcutaneous Tissue and Slough and after achieving pain control using Lidocaine 4% Topical Solution. No specimens were taken. A time out was conducted at 15:11, prior to the start of the procedure. A Minimum amount of bleeding was controlled with Pressure. The procedure was tolerated well with a pain level of 0 throughout and a pain level of 0 following the procedure. Post Debridement Measurements: 1.1cm length x 0.4cm width x 0.3cm depth; 0.104cm^3 volume. Post debridement Stage noted as Category/Stage III. Lacey Cook, Lacey Cook ([id number]) Character of Wound/Ulcer Post Debridement is improved. Post procedure Diagnosis Wound #1: Same as Pre-Procedure Plan Wound Cleansing: Wound #1 Midline Sacrum: Clean wound with Normal Saline. May Shower, gently pat wound dry prior to applying new dressing. Primary Wound Dressing: Wound #1 Midline Sacrum: Silver Collagen Secondary Dressing: Wound #1 Midline Sacrum: Boardered Foam Dressing Dressing Change Frequency: Wound #1 Midline Sacrum: Change Dressing Monday, Wednesday, Friday - SNF nurses to verify dressing integrity every shift and replace if needed Follow-up Appointments: Return Appointment in 2 weeks. Off-Loading: Wound #1 Midline Sacrum: Turn and reposition every 2 hours 1. My suggestion at this point is good to be that we go ahead and initiate treatment with a silver collagen dressing to try to see if we can stimulate tissue growth. 2. I am also can recommend a border foam dressing to keep pressure off of the area and hopefully aid in healing as well. 3. I do think turning and repositioning every 2 hours minimal is can be necessary in order to keep from additional pressure injury to this area or any other area for  that matter. 4. I do think a air mattress would be appropriate for the patient in the skilled nursing facility as well. We will see patient back for reevaluation in 2 weeks here in the clinic. If anything worsens or changes patient will contact our office for additional recommendations. Electronic Signature(s) Signed: 08/18/2019 5:47:15 PM By: Worthy Keeler PA-C Entered By: Worthy Keeler on 08/18/2019 17:47:15 Lacey Cook ([id number]) -------------------------------------------------------------------------------- ROS/PFSH Details Patient Name: Lacey Cook Date of Service: 08/17/2019 2:15 PM Medical Record Number: [id number] Patient Account Number: [id number] Date of Birth/Sex: 25-Apr-1956 (63 y.o. F) Treating RN: Army Melia Primary Care Provider: Doristine Mango Other Clinician: Referring Provider: Virgel Bouquet Treating Provider/Extender: Melburn Hake, Zayd Bonet Weeks in Treatment: 0 Unable to Obtain Patient History due to oo Dementia Constitutional Symptoms (General Health) Complaints and Symptoms: Negative for: Fatigue; Fever; Chills; Marked Weight Change Medical History: Past Medical History Notes: 2 strokes and heart attack Type II Diabetes Eyes Complaints and Symptoms: Negative for: Dry Eyes; Vision Changes; Glasses / Contacts Ear/Nose/Mouth/Throat Complaints and Symptoms: Negative for: Difficult clearing ears; Sinusitis Hematologic/Lymphatic Complaints and Symptoms: Negative for: Bleeding / Clotting Disorders; Human Immunodeficiency Virus Respiratory Complaints and Symptoms: Negative for: Chronic  or frequent coughs; Shortness of Breath Neurologic Complaints and Symptoms: Negative for: Numbness/parasthesias; Focal/Weakness Medical History: Positive for: Dementia - Alzheimers Cardiovascular Gastrointestinal Endocrine Medical History: Positive for: Type II Diabetes Treated with: Oral agents Genitourinary Medical History: Positive for: End Stage Renal Disease  - CKD III Musculoskeletal Lacey Cook, Lacey Cook ([id number]) Oncologic Psychiatric Medical History: Past Medical History Notes: Depression Immunizations Implantable Devices None Family and Social History Cancer: No Electronic Signature(s) Signed: 08/17/2019 4:13:28 PM By: Army Melia Signed: 08/18/2019 5:56:47 PM By: Worthy Keeler PA-C Entered By: Army Melia on 08/17/2019 14:59:21 Lacey Cook ([id number]) -------------------------------------------------------------------------------- SuperBill Details Patient Name: Lacey Cook Date of Service: 08/17/2019 Medical Record Number: [id number] Patient Account Number: [id number] Date of Birth/Sex: May 26, 1956 (63 y.o. F) Treating RN: Montey Hora Primary Care Provider: Doristine Mango Other Clinician: Referring Provider: Virgel Bouquet Treating Provider/Extender: Melburn Hake, Eiko Mcgowen Weeks in Treatment: 0 Diagnosis Coding ICD-10 Codes Code Description L89.153 Pressure ulcer of sacral region, stage 3 M62.81 Muscle weakness (generalized) E11.622 Type 2 diabetes mellitus with other skin ulcer G30.8 Other Alzheimer's disease Facility Procedures CPT4 Code: 72094709 Description: 62836 - WOUND CARE VISIT-LEV 3 EST PT Modifier: Quantity: 1 CPT4 Code: [id number] Description: 65035 - DEB SUBQ TISSUE 20 SQ CM/< Modifier: Quantity: 1 CPT4 Code: Description: ICD-10 Diagnosis Description L89.153 Pressure ulcer of sacral region, stage 3 Modifier: Quantity: Physician Procedures CPT4 Code: 4656812 Description: 75170 - WC PHYS LEVEL 4 - NEW PT Modifier: 25 Quantity: 1 CPT4 Code: Description: ICD-10 Diagnosis Description L89.153 Pressure ulcer of sacral region, stage 3 M62.81 Muscle weakness (generalized) E11.622 Type 2 diabetes mellitus with other skin ulcer G30.8 Other Alzheimer's disease Modifier: Quantity: CPT4 Code: [id number] Description: 96759 - WC PHYS SUBQ TISS 20 SQ CM Modifier: Quantity: 1 CPT4 Code: Description: ICD-10  Diagnosis Description L89.153 Pressure ulcer of sacral region, stage 3 Modifier: Quantity: Electronic Signature(s) Signed: 08/18/2019 5:56:47 PM By: Worthy Keeler PA-C Entered By: Worthy Keeler on 08/17/2019 23:55:38

## 2019-08-23 ENCOUNTER — Other Ambulatory Visit: Payer: Self-pay

## 2019-08-23 ENCOUNTER — Ambulatory Visit (INDEPENDENT_AMBULATORY_CARE_PROVIDER_SITE_OTHER): Payer: Medicaid Other | Admitting: Plastic Surgery

## 2019-08-23 ENCOUNTER — Encounter: Payer: Self-pay | Admitting: Plastic Surgery

## 2019-08-23 VITALS — BP 126/86 | HR 106 | Temp 97.7°F

## 2019-08-23 DIAGNOSIS — L03119 Cellulitis of unspecified part of limb: Secondary | ICD-10-CM

## 2019-08-23 NOTE — Progress Notes (Signed)
Patient is a 64 year old female here with her transporter from the nursing facility.  She presents for follow-up after undergoing excision of bilateral leg wounds and placement of ACell on 07/27/2019 with Dr. Marla Roe.  Patient reports she " does not know how her knees are doing".  Denies pain.  Bilateral knee wounds appear to be healing.  ACell is fully incorporated.  Significant scabs are present with some exudate.  No signs of infection, seroma/hematoma.  Adaptec removed and bilateral wounds cleaned.  Covered with Xeroform, gauze, Mepilex border dressing.  Do daily dressing changes of Xeroform, gauze, Mepilex border dressing.  Ensure good protein intake for optimal wound healing.  Follow-up in 2 weeks.  Call office with any questions/concerns.  The [place] was signed into law in 2016 which includes the topic of electronic health records.  This provides immediate access to information in MyChart.  This includes consultation notes, operative notes, office notes, lab results and pathology reports.  If you have any questions about what you read please let us know at your next visit or call us at the office.  We are right here with you.

## 2019-08-31 ENCOUNTER — Encounter: Payer: Medicaid Other | Attending: Physician Assistant | Admitting: Physician Assistant

## 2019-08-31 ENCOUNTER — Other Ambulatory Visit: Payer: Self-pay

## 2019-08-31 DIAGNOSIS — L89153 Pressure ulcer of sacral region, stage 3: Secondary | ICD-10-CM | POA: Diagnosis present

## 2019-08-31 DIAGNOSIS — E11622 Type 2 diabetes mellitus with other skin ulcer: Secondary | ICD-10-CM | POA: Diagnosis not present

## 2019-08-31 DIAGNOSIS — M6281 Muscle weakness (generalized): Secondary | ICD-10-CM | POA: Insufficient documentation

## 2019-08-31 DIAGNOSIS — G308 Other Alzheimer's disease: Secondary | ICD-10-CM | POA: Diagnosis not present

## 2019-09-01 NOTE — Progress Notes (Addendum)
Lacey Cook, Lacey Cook ([id number]) Visit Report for 08/31/2019 Arrival Information Details Patient Name: Lacey Cook, Lacey Cook Date of Service: 08/31/2019 10:30 AM Medical Record Number: [id number] Patient Account Number: [id number] Date of Birth/Sex: [birth year] (64 y.o. F) Treating RN: Army Melia Primary Care Hoyle Barkdull: Doristine Mango Other Clinician: Referring  Antolin: Doristine Mango Treating Corrin Hingle/Extender: Melburn Hake, HOYT Weeks in Treatment: 2 Visit Information History Since Last Visit Added or deleted any medications: No Patient Arrived: Wheel Chair Any new allergies or adverse reactions: No Arrival Time: 10:51 Had a fall or experienced change in No Accompanied By: caregiver activities of daily living that may affect Transfer Assistance: Harrel Lemon Lift risk of falls: Patient Identification Verified: Yes Signs or symptoms of abuse/neglect since last visito No Hospitalized since last visit: No Has Dressing in Place as Prescribed: Yes Pain Present Now: No Electronic Signature(s) Signed: 08/31/2019 3:54:04 PM By: Army Melia Entered By: Army Melia on 08/31/2019 10:51:26 Lacey Cook ([id number]) -------------------------------------------------------------------------------- Clinic Level of Care Assessment Details Patient Name: Lacey Cook Date of Service: 08/31/2019 10:30 AM Medical Record Number: [id number] Patient Account Number: [id number] Date of Birth/Sex: 08/24/1955 (63 y.o. F) Treating RN: Montey Hora Primary Care Dharma Pare: Doristine Mango Other Clinician: Referring Sony Schlarb: Doristine Mango Treating Carlisle Torgeson/Extender: Melburn Hake, HOYT Weeks in Treatment: 2 Clinic Level of Care Assessment Items TOOL 4 Quantity Score []  - Use when only an EandM is performed on FOLLOW-UP visit 0 ASSESSMENTS - Nursing Assessment / Reassessment X - Reassessment of Co-morbidities (includes updates in patient status) 1 10 X- 1 5 Reassessment of Adherence to Treatment Plan ASSESSMENTS -  Wound and Skin Assessment / Reassessment X - Simple Wound Assessment / Reassessment - one wound 1 5 []  - 0 Complex Wound Assessment / Reassessment - multiple wounds []  - 0 Dermatologic / Skin Assessment (not related to wound area) ASSESSMENTS - Focused Assessment []  - Circumferential Edema Measurements - multi extremities 0 []  - 0 Nutritional Assessment / Counseling / Intervention []  - 0 Lower Extremity Assessment (monofilament, tuning fork, pulses) []  - 0 Peripheral Arterial Disease Assessment (using hand held doppler) ASSESSMENTS - Ostomy and/or Continence Assessment and Care []  - Incontinence Assessment and Management 0 []  - 0 Ostomy Care Assessment and Management (repouching, etc.) PROCESS - Coordination of Care X - Simple Patient / Family Education for ongoing care 1 15 []  - 0 Complex (extensive) Patient / Family Education for ongoing care X- 1 10 Staff obtains Programmer, systems, Records, Test Results / Process Orders []  - 0 Staff telephones HHA, Nursing Homes / Clarify orders / etc []  - 0 Routine Transfer to another Facility (non-emergent condition) []  - 0 Routine Hospital Admission (non-emergent condition) []  - 0 New Admissions / Biomedical engineer / Ordering NPWT, Apligraf, etc. []  - 0 Emergency Hospital Admission (emergent condition) X- 1 10 Simple Discharge Coordination []  - 0 Complex (extensive) Discharge Coordination PROCESS - Special Needs []  - Pediatric / Minor Patient Management 0 []  - 0 Isolation Patient Management []  - 0 Hearing / Language / Visual special needs []  - 0 Assessment of Community assistance (transportation, D/C planning, etc.) []  - 0 Additional assistance / Altered mentation []  - 0 Support Surface(s) Assessment (bed, cushion, seat, etc.) INTERVENTIONS - Wound Cleansing / Measurement Bethard, Trent ([id number]) X- 1 5 Simple Wound Cleansing - one wound []  - 0 Complex Wound Cleansing - multiple wounds X- 1 5 Wound Imaging (photographs -  any number of wounds) []  - 0 Wound Tracing (instead of photographs) X- 1 5 Simple Wound Measurement - one wound []  - 0 Complex Wound  Measurement - multiple wounds INTERVENTIONS - Wound Dressings X - Small Wound Dressing one or multiple wounds 1 10 []  - 0 Medium Wound Dressing one or multiple wounds []  - 0 Large Wound Dressing one or multiple wounds []  - 0 Application of Medications - topical []  - 0 Application of Medications - injection INTERVENTIONS - Miscellaneous []  - External ear exam 0 []  - 0 Specimen Collection (cultures, biopsies, blood, body fluids, etc.) []  - 0 Specimen(s) / Culture(s) sent or taken to Lab for analysis X- 1 10 Patient Transfer (multiple staff / Harrel Lemon Lift / Similar devices) []  - 0 Simple Staple / Suture removal (25 or less) []  - 0 Complex Staple / Suture removal (26 or more) []  - 0 Hypo / Hyperglycemic Management (close monitor of Blood Glucose) []  - 0 Ankle / Brachial Index (ABI) - do not check if billed separately X- 1 5 Vital Signs Has the patient been seen at the hospital within the last three years: Yes Total Score: 95 Level Of Care: New/Established - Level 3 Electronic Signature(s) Signed: 08/31/2019 4:31:26 PM By: Montey Hora Entered By: Montey Hora on 08/31/2019 11:19:49 Lacey Cook ([id number]) -------------------------------------------------------------------------------- Encounter Discharge Information Details Patient Name: Lacey Cook Date of Service: 08/31/2019 10:30 AM Medical Record Number: [id number] Patient Account Number: [id number] Date of Birth/Sex: Sep 27, 1955 (64 y.o. F) Treating RN: Montey Hora Primary Care Dushaun Okey: Doristine Mango Other Clinician: Referring Colin Norment: Doristine Mango Treating Vraj Denardo/Extender: Melburn Hake, HOYT Weeks in Treatment: 2 Encounter Discharge Information Items Discharge Condition: Stable Ambulatory Status: Wheelchair Discharge Destination: Skilled Nursing  Facility Telephoned: No Orders Sent: Yes Transportation: Private Auto Accompanied By: caregiver Schedule Follow-up Appointment: Yes Clinical Summary of Care: Electronic Signature(s) Signed: 08/31/2019 4:31:26 PM By: Montey Hora Entered By: Montey Hora on 08/31/2019 11:21:04 Lacey Cook ([id number]) -------------------------------------------------------------------------------- Lower Extremity Assessment Details Patient Name: Lacey Cook Date of Service: 08/31/2019 10:30 AM Medical Record Number: [id number] Patient Account Number: [id number] Date of Birth/Sex: 04/18/1956 (63 y.o. F) Treating RN: Army Melia Primary Care Rendon Howell: Doristine Mango Other Clinician: Referring Vista Sawatzky: Doristine Mango Treating Waver Dibiasio/Extender: Melburn Hake, HOYT Weeks in Treatment: 2 Electronic Signature(s) Signed: 08/31/2019 3:54:04 PM By: Army Melia Entered By: Army Melia on 08/31/2019 10:57:44 Lacey Cook ([id number]) -------------------------------------------------------------------------------- Multi Wound Chart Details Patient Name: Lacey Cook Date of Service: 08/31/2019 10:30 AM Medical Record Number: [id number] Patient Account Number: [id number] Date of Birth/Sex: [birth year] (63 y.o. F) Treating RN: Montey Hora Primary Care Hanya Guerin: Doristine Mango Other Clinician: Referring Michaiah Holsopple: Doristine Mango Treating Apollo Timothy/Extender: Melburn Hake, HOYT Weeks in Treatment: 2 Vital Signs Height(in): 64 Pulse(bpm): 114 Weight(lbs): 200 Blood Pressure(mmHg): 140/93 Body Mass Index(BMI): 34 Temperature(F): 99.2 Respiratory Rate(breaths/min): 16 Photos: [N/A:N/A] Wound Location: Midline Sacrum N/A N/A Wounding Event: Gradually Appeared N/A N/A Primary Etiology: Pressure Ulcer N/A N/A Comorbid History: Type II Diabetes, End Stage Renal N/A N/A Disease, Dementia Date Acquired: 06/28/2019 N/A N/A Weeks of Treatment: 2 N/A N/A Wound Status: Open N/A N/A Measurements L x W  x D (cm) 1.4x0.5x0.2 N/A N/A Area (cm) : 0.55 N/A N/A Volume (cm) : 0.11 N/A N/A % Reduction in Area: -59.00% N/A N/A % Reduction in Volume: -59.40% N/A N/A Classification: Category/Stage III N/A N/A Exudate Amount: Large N/A N/A Exudate Type: Serous N/A N/A Exudate Color: amber N/A N/A Wound Margin: Flat and Intact N/A N/A Granulation Amount: Large (67-100%) N/A N/A Granulation Quality: Red N/A N/A Necrotic Amount: Small (1-33%) N/A N/A Exposed Structures: Fat Layer (Subcutaneous Tissue) N/A N/A Exposed: Yes Fascia: No Tendon: No Muscle: No Joint: No  Bone: No Epithelialization: None N/A N/A Assessment Notes: macerated edges N/A N/A Treatment Notes Electronic Signature(s) Signed: 08/31/2019 4:31:26 PM By: Montey Hora Entered By: Montey Hora on 08/31/2019 11:03:13 Lacey Cook ([id number]) -------------------------------------------------------------------------------- Multi-Disciplinary Care Plan Details Patient Name: Lacey Cook Date of Service: 08/31/2019 10:30 AM Medical Record Number: [id number] Patient Account Number: [id number] Date of Birth/Sex: June 28, 1955 (63 y.o. F) Treating RN: Montey Hora Primary Care Shandiin Eisenbeis: Doristine Mango Other Clinician: Referring Indy Prestwood: Doristine Mango Treating Jyllian Haynie/Extender: Melburn Hake, HOYT Weeks in Treatment: 2 Active Inactive Electronic Signature(s) Signed: 09/30/2019 3:56:10 PM By: Gretta Cool, BSN, RN, CWS, Kim RN, BSN Signed: 11/19/2019 10:49:50 AM By: Montey Hora Previous Signature: 08/31/2019 4:31:26 PM Version By: Montey Hora Entered By: Gretta Cool BSN, RN, CWS, Kim on 09/30/2019 15:56:10 Lacey Cook ([id number]) -------------------------------------------------------------------------------- Pain Assessment Details Patient Name: Lacey Cook Date of Service: 08/31/2019 10:30 AM Medical Record Number: [id number] Patient Account Number: [id number] Date of Birth/Sex: 02/22/1956 (63 y.o. F) Treating RN: Army Melia Primary Care Landyn Buckalew: Doristine Mango Other Clinician: Referring Stephany Poorman: Doristine Mango Treating Rhayne Chatwin/Extender: Melburn Hake, HOYT Weeks in Treatment: 2 Active Problems Location of Pain Severity and Description of Pain Patient Has Paino No Site Locations Pain Management and Medication Current Pain Management: Electronic Signature(s) Signed: 08/31/2019 3:54:04 PM By: Army Melia Entered By: Army Melia on 08/31/2019 10:52:05 Lacey Cook ([id number]) -------------------------------------------------------------------------------- Patient/Caregiver Education Details Patient Name: Lacey Cook Date of Service: 08/31/2019 10:30 AM Medical Record Number: [id number] Patient Account Number: [id number] Date of Birth/Gender: 1956-01-24 (63 y.o. F) Treating RN: Montey Hora Primary Care Physician: Doristine Mango Other Clinician: Referring Physician: Doristine Mango Treating Physician/Extender: Sharalyn Ink in Treatment: 2 Education Assessment Education Provided To: Caregiver SNF nurses Education Topics Provided Wound/Skin Impairment: Handouts: Other: signed wound care orders Methods: Printed Electronic Signature(s) Signed: 08/31/2019 4:31:26 PM By: Montey Hora Entered By: Montey Hora on 08/31/2019 11:20:17 Lacey Cook ([id number]) -------------------------------------------------------------------------------- Wound Assessment Details Patient Name: Lacey Cook Date of Service: 08/31/2019 10:30 AM Medical Record Number: [id number] Patient Account Number: [id number] Date of Birth/Sex: 1956-03-10 (63 y.o. F) Treating RN: Army Melia Primary Care Aron Inge: Doristine Mango Other Clinician: Referring Leeam Cedrone: Doristine Mango Treating Marrah Vanevery/Extender: Melburn Hake, HOYT Weeks in Treatment: 2 Wound Status Wound Number: 1 Primary Etiology: Pressure Ulcer Wound Location: Midline Sacrum Wound Status: Open Wounding Event: Gradually Appeared  Comorbid Type II Diabetes, End Stage Renal Disease, History: Dementia Date Acquired: 06/28/2019 Weeks Of Treatment: 2 Clustered Wound: No Photos Wound Measurements Length: (cm) 1.4 Width: (cm) 0.5 Depth: (cm) 0.2 Area: (cm) 0.55 Volume: (cm) 0.11 % Reduction in Area: -59% % Reduction in Volume: -59.4% Epithelialization: None Tunneling: No Undermining: No Wound Description Classification: Category/Stage III Wound Margin: Flat and Intact Exudate Amount: Large Exudate Type: Serous Exudate Color: amber Foul Odor After Cleansing: No Slough/Fibrino Yes Wound Bed Granulation Amount: Large (67-100%) Exposed Structure Granulation Quality: Red Fascia Exposed: No Necrotic Amount: Small (1-33%) Fat Layer (Subcutaneous Tissue) Exposed: Yes Necrotic Quality: Adherent Slough Tendon Exposed: No Muscle Exposed: No Joint Exposed: No Bone Exposed: No Assessment Notes macerated edges Electronic Signature(s) Signed: 08/31/2019 3:54:04 PM By: Army Melia Entered By: Army Melia on 08/31/2019 10:57:34 Lacey Cook ([id number]) -------------------------------------------------------------------------------- Vitals Details Patient Name: Lacey Cook Date of Service: 08/31/2019 10:30 AM Medical Record Number: [id number] Patient Account Number: [id number] Date of Birth/Sex: 1955/11/28 (64 y.o. F) Treating RN: Army Melia Primary Care Quincey Quesinberry: Doristine Mango Other Clinician: Referring Earon Rivest: Doristine Mango Treating Jeweline Reif/Extender: Melburn Hake, HOYT Weeks in Treatment: 2 Vital Signs Time Taken: 10:51 Temperature (F): 99.2 Height (in):  64 Pulse (bpm): 114 Weight (lbs): 200 Respiratory Rate (breaths/min): 16 Body Mass Index (BMI): 34.3 Blood Pressure (mmHg): 140/93 Reference Range: 80 - 120 mg / dl Electronic Signature(s) Signed: 08/31/2019 3:54:04 PM By: Army Melia Entered By: Army Melia on 08/31/2019 10:51:49

## 2019-09-01 NOTE — Progress Notes (Addendum)
Lacey Cook ([id number]) Visit Report for 08/31/2019 Chief Complaint Document Details Patient Name: Lacey Cook Date of Service: 08/31/2019 10:30 AM Medical Record Number: [id number] Patient Account Number: [id number] Date of Birth/Sex: 1955-06-06 (64 y.o. F) Treating RN: Montey Hora Primary Care Provider: Doristine Mango Other Clinician: Referring Provider: Doristine Mango Treating Provider/Extender: Melburn Hake, Carrell Palmatier Weeks in Treatment: 2 Information Obtained from: Patient Chief Complaint Sacral pressure ulcer Electronic Signature(s) Signed: 08/31/2019 11:00:44 AM By: Worthy Keeler PA-C Entered By: Worthy Keeler on 08/31/2019 11:00:43 Lacey Cook ([id number]) -------------------------------------------------------------------------------- HPI Details Patient Name: Lacey Cook Date of Service: 08/31/2019 10:30 AM Medical Record Number: [id number] Patient Account Number: [id number] Date of Birth/Sex: [birth year] (64 y.o. F) Treating RN: Montey Hora Primary Care Provider: Doristine Mango Other Clinician: Referring Provider: Doristine Mango Treating Provider/Extender: Melburn Hake, Benjimen Kelley Weeks in Treatment: 2 History of Present Illness HPI Description: 08/17/2019 upon evaluation today patient presents with a midline sacral ulcer. Unfortunately we really do not have a lot of history actually did speak during the point of obtaining consent to see the patient with the patient's daughter by way of telephone Di Kindle was on the phone with me and we obtained verbal consent for treatment and debridement if necessary. With that being said unfortunately when the triage nurse was gathering information from the daughter getting the patient checked again there really was not a lot in the way of medical history that we were able to obtain most of the medical history was obtained from the patient's record from the skilled nursing facility. Her daughter did not even realize that there was a  wound on the sacral region which is what we are actually seeing her for. The only thing she knew about where the wounds on her knees which are actually being managed by a separate provider. The patient does have a history of generalized muscle weakness, diabetes mellitus type 2, and Alzheimer's disease. I really have no idea exactly when the sacral wound began. 08/31/2019 on evaluation today patient appears to be doing roughly the same with regard to her wound. She has been tolerating the dressing changes without complication. Fortunately there is no signs of active infection at this time. No fevers, chills, nausea, vomiting, or diarrhea. With that being said I do think the collagen can still be beneficial for her I definitely think this needs to be changed on a regular basis. Also think that an air mattress if she does not have one needs to be initiated I thought we had written that in the orders last week but I am not sure that we did. Nonetheless we definitely need to address that at this point in my opinion. Electronic Signature(s) Signed: 09/03/2019 9:27:41 AM By: Worthy Keeler PA-C Entered By: Worthy Keeler on 09/03/2019 09:27:40 Lacey Cook ([id number]) -------------------------------------------------------------------------------- Physical Exam Details Patient Name: Lacey Cook Date of Service: 08/31/2019 10:30 AM Medical Record Number: [id number] Patient Account Number: [id number] Date of Birth/Sex: Jun 05, 1955 (64 y.o. F) Treating RN: Montey Hora Primary Care Provider: Doristine Mango Other Clinician: Referring Provider: Doristine Mango Treating Provider/Extender: STONE III, Mackensie Pilson Weeks in Treatment: 2 Constitutional Well-nourished and well-hydrated in no acute distress. Respiratory normal breathing without difficulty. Psychiatric this patient is able to make decisions and demonstrates good insight into disease process. Alert and Oriented x 3. pleasant and  cooperative. Notes Inspection patient's wound bed actually showed signs of fairly good granulation at this time. There is no signs of active infection and overall very pleased with where things stand. No fevers,  chills, nausea, vomiting, or diarrhea. Electronic Signature(s) Signed: 09/03/2019 9:28:01 AM By: Worthy Keeler PA-C Entered By: Worthy Keeler on 09/03/2019 09:28:01 Lacey Cook ([id number]) -------------------------------------------------------------------------------- Physician Orders Details Patient Name: Lacey Cook Date of Service: 08/31/2019 10:30 AM Medical Record Number: [id number] Patient Account Number: [id number] Date of Birth/Sex: 1955/10/12 (64 y.o. F) Treating RN: Montey Hora Primary Care Provider: Doristine Mango Other Clinician: Referring Provider: Doristine Mango Treating Provider/Extender: Melburn Hake, Yadier Bramhall Weeks in Treatment: 2 Verbal / Phone Orders: No Diagnosis Coding ICD-10 Coding Code Description L89.153 Pressure ulcer of sacral region, stage 3 M62.81 Muscle weakness (generalized) E11.622 Type 2 diabetes mellitus with other skin ulcer G30.8 Other Alzheimer's disease Wound Cleansing Wound #1 Midline Sacrum o Clean wound with Normal Saline. o May Shower, gently pat wound dry prior to applying new dressing. Primary Wound Dressing Wound #1 Midline Sacrum o Silver Collagen Secondary Dressing Wound #1 Midline Sacrum o Boardered Foam Dressing Dressing Change Frequency Wound #1 Midline Sacrum o Change Dressing Monday, Wednesday, Friday - SNF nurses to verify dressing integrity every shift and replace if needed Follow-up Appointments o Return Appointment in 2 weeks. Off-Loading Wound #1 Midline Sacrum o Mattress - SNF to initiate air mattress for patient o Turn and reposition every 2 hours Notes Patient should follow up with Plastic Surgery for knee wounds. If Plastic Surgery wants to release care of those wounds, they can  send notes to our clinic. Electronic Signature(s) Signed: 08/31/2019 4:31:26 PM By: Montey Hora Signed: 09/03/2019 10:31:16 AM By: Worthy Keeler PA-C Entered By: Montey Hora on 08/31/2019 11:07:11 Lacey Cook ([id number]) -------------------------------------------------------------------------------- Problem List Details Patient Name: Lacey Cook Date of Service: 08/31/2019 10:30 AM Medical Record Number: [id number] Patient Account Number: [id number] Date of Birth/Sex: 1955/09/20 (64 y.o. F) Treating RN: Montey Hora Primary Care Provider: Doristine Mango Other Clinician: Referring Provider: Doristine Mango Treating Provider/Extender: Melburn Hake, Greydis [name] Weeks in Treatment: 2 Active Problems ICD-10 Evaluated Encounter Code Description Active Date Today Diagnosis L89.153 Pressure ulcer of sacral region, stage 3 08/17/2019 No Yes M62.81 Muscle weakness (generalized) 08/17/2019 No Yes E11.622 Type 2 diabetes mellitus with other skin ulcer 08/17/2019 No Yes G30.8 Other Alzheimer's disease 08/17/2019 No Yes Inactive Problems Resolved Problems Electronic Signature(s) Signed: 08/31/2019 11:00:37 AM By: Worthy Keeler PA-C Entered By: Worthy Keeler on 08/31/2019 11:00:36 Lacey Cook ([id number]) -------------------------------------------------------------------------------- Progress Note Details Patient Name: Lacey Cook Date of Service: 08/31/2019 10:30 AM Medical Record Number: [id number] Patient Account Number: [id number] Date of Birth/Sex: 02/25/1956 (64 y.o. F) Treating RN: Montey Hora Primary Care Provider: Doristine Mango Other Clinician: Referring Provider: Doristine Mango Treating Provider/Extender: Melburn Hake, Nandini Bogdanski Weeks in Treatment: 2 Subjective Chief Complaint Information obtained from Patient Sacral pressure ulcer History of Present Illness (HPI) 08/17/2019 upon evaluation today patient presents with a midline sacral ulcer. Unfortunately we really do  not have a lot of history actually did speak during the point of obtaining consent to see the patient with the patient's daughter by way of telephone Di Kindle was on the phone with me and we obtained verbal consent for treatment and debridement if necessary. With that being said unfortunately when the triage nurse was gathering information from the daughter getting the patient checked again there really was not a lot in the way of medical history that we were able to obtain most of the medical history was obtained from the patient's record from the skilled nursing facility. Her daughter did not even realize that there was a wound on the sacral region which is  what we are actually seeing her for. The only thing she knew about where the wounds on her knees which are actually being managed by a separate provider. The patient does have a history of generalized muscle weakness, diabetes mellitus type 2, and Alzheimer's disease. I really have no idea exactly when the sacral wound began. 08/31/2019 on evaluation today patient appears to be doing roughly the same with regard to her wound. She has been tolerating the dressing changes without complication. Fortunately there is no signs of active infection at this time. No fevers, chills, nausea, vomiting, or diarrhea. With that being said I do think the collagen can still be beneficial for her I definitely think this needs to be changed on a regular basis. Also think that an air mattress if she does not have one needs to be initiated I thought we had written that in the orders last week but I am not sure that we did. Nonetheless we definitely need to address that at this point in my opinion. Objective Constitutional Well-nourished and well-hydrated in no acute distress. Vitals Time Taken: 10:51 AM, Height: 64 in, Weight: 200 lbs, BMI: 34.3, Temperature: 99.2 F, Pulse: 114 bpm, Respiratory Rate: 16 breaths/min, Blood Pressure: 140/93 mmHg. Respiratory normal  breathing without difficulty. Psychiatric this patient is able to make decisions and demonstrates good insight into disease process. Alert and Oriented x 3. pleasant and cooperative. General Notes: Inspection patient's wound bed actually showed signs of fairly good granulation at this time. There is no signs of active infection and overall very pleased with where things stand. No fevers, chills, nausea, vomiting, or diarrhea. Integumentary (Hair, Skin) Wound #1 status is Open. Original cause of wound was Gradually Appeared. The wound is located on the Midline Sacrum. The wound measures 1.4cm length x 0.5cm width x 0.2cm depth; 0.55cm^2 area and 0.11cm^3 volume. There is Fat Layer (Subcutaneous Tissue) Exposed exposed. There is no tunneling or undermining noted. There is a large amount of serous drainage noted. The wound margin is flat and intact. There is large (67-100%) red granulation within the wound bed. There is a small (1-33%) amount of necrotic tissue within the wound bed including Adherent Slough. General Notes: macerated edges Manternach, Ellizabeth ([id number]) Assessment Active Problems ICD-10 Pressure ulcer of sacral region, stage 3 Muscle weakness (generalized) Type 2 diabetes mellitus with other skin ulcer Other Alzheimer's disease Plan Wound Cleansing: Wound #1 Midline Sacrum: Clean wound with Normal Saline. May Shower, gently pat wound dry prior to applying new dressing. Primary Wound Dressing: Wound #1 Midline Sacrum: Silver Collagen Secondary Dressing: Wound #1 Midline Sacrum: Boardered Foam Dressing Dressing Change Frequency: Wound #1 Midline Sacrum: Change Dressing Monday, Wednesday, Friday - SNF nurses to verify dressing integrity every shift and replace if needed Follow-up Appointments: Return Appointment in 2 weeks. Off-Loading: Wound #1 Midline Sacrum: Mattress - SNF to initiate air mattress for patient Turn and reposition every 2 hours General Notes: Patient  should follow up with Plastic Surgery for knee wounds. If Plastic Surgery wants to release care of those wounds, they can send notes to our clinic. 1. My suggestion currently is going to be that we go ahead and continue with the silver collagen which I think can still be a good option here. I also think that continue with the bordered foam dressing is good as this provides some protection as well for the patient. 2. My suggestion concerning the offloading is that we do need to have an air mattress for the patient. I also  believe that she needs to be turned and repositioned every 2 hours at minimum. Obviously this is still of utmost importance. 3. With regard to the knees this is something that has been apparently managed by plastic surgery I am not really certain that we need to address this further on our hand or at least we would need to have release in order to do so. We will see patient back for reevaluation in 1 week here in the clinic. If anything worsens or changes patient will contact our office for additional recommendations. Electronic Signature(s) Signed: 09/03/2019 9:30:15 AM By: Worthy Keeler PA-C Entered By: Worthy Keeler on 09/03/2019 09:30:15 Lacey Cook ([id number]) -------------------------------------------------------------------------------- SuperBill Details Patient Name: Lacey Cook Date of Service: 08/31/2019 Medical Record Number: [id number] Patient Account Number: [id number] Date of Birth/Sex: [birth year] (64 y.o. F) Treating RN: Montey Hora Primary Care Provider: Doristine Mango Other Clinician: Referring Provider: Doristine Mango Treating Provider/Extender: Melburn Hake, Buren Havey Weeks in Treatment: 2 Diagnosis Coding ICD-10 Codes Code Description L89.153 Pressure ulcer of sacral region, stage 3 M62.81 Muscle weakness (generalized) E11.622 Type 2 diabetes mellitus with other skin ulcer G30.8 Other Alzheimer's disease Facility Procedures CPT4 Code:  [id number] Description: 99213 - WOUND CARE VISIT-LEV 3 EST PT Modifier: Quantity: 1 Physician Procedures CPT4 Code: [id number] Description: 29518 - WC PHYS LEVEL 3 - EST PT Modifier: Quantity: 1 CPT4 Code: Description: ICD-10 Diagnosis Description L89.153 Pressure ulcer of sacral region, stage 3 M62.81 Muscle weakness (generalized) E11.622 Type 2 diabetes mellitus with other skin ulcer G30.8 Other Alzheimer's disease Modifier: Quantity: Electronic Signature(s) Signed: 09/03/2019 10:31:16 AM By: Worthy Keeler PA-C Entered By: Worthy Keeler on 08/31/2019 23:51:17

## 2019-09-07 NOTE — Progress Notes (Deleted)
Patient is a 64 yr-old female here for follow-up after undergoing excision of bilateral knee wounds and placement of ACell on 07/27/19 with Dr. Marla Roe. She is here today with ***.  Daily dressing changes of Xeroform, gauze, Mepilex border dressing.   Sacral wound being cared for by [facility].

## 2019-09-08 ENCOUNTER — Ambulatory Visit: Payer: Medicaid Other | Admitting: Plastic Surgery

## 2019-09-14 ENCOUNTER — Ambulatory Visit: Payer: Medicaid Other | Admitting: Physician Assistant

## 2019-09-16 ENCOUNTER — Ambulatory Visit: Payer: Medicaid Other | Admitting: Internal Medicine

## 2019-09-21 ENCOUNTER — Ambulatory Visit: Payer: Medicaid Other | Admitting: Student in an Organized Health Care Education/Training Program

## 2019-10-15 ENCOUNTER — Emergency Department (HOSPITAL_COMMUNITY): Payer: Medicaid Other

## 2019-10-15 ENCOUNTER — Other Ambulatory Visit: Payer: Self-pay

## 2019-10-15 ENCOUNTER — Encounter (HOSPITAL_COMMUNITY): Payer: Self-pay | Admitting: Emergency Medicine

## 2019-10-15 ENCOUNTER — Inpatient Hospital Stay (HOSPITAL_COMMUNITY)
Admission: EM | Admit: 2019-10-15 | Discharge: 2019-10-26 | DRG: 871 | Disposition: A | Discharge status: Skilled Nursing Facility | Payer: Medicaid Other | Attending: Internal Medicine | Admitting: Internal Medicine

## 2019-10-15 DIAGNOSIS — M24551 Contracture, right hip: Secondary | ICD-10-CM | POA: Diagnosis present

## 2019-10-15 DIAGNOSIS — E669 Obesity, unspecified: Secondary | ICD-10-CM | POA: Diagnosis not present

## 2019-10-15 DIAGNOSIS — M25551 Pain in right hip: Secondary | ICD-10-CM | POA: Diagnosis not present

## 2019-10-15 DIAGNOSIS — Z20822 Contact with and (suspected) exposure to covid-19: Secondary | ICD-10-CM | POA: Diagnosis present

## 2019-10-15 DIAGNOSIS — E119 Type 2 diabetes mellitus without complications: Secondary | ICD-10-CM | POA: Diagnosis present

## 2019-10-15 DIAGNOSIS — F329 Major depressive disorder, single episode, unspecified: Secondary | ICD-10-CM | POA: Diagnosis present

## 2019-10-15 DIAGNOSIS — G300 Alzheimer's disease with early onset: Secondary | ICD-10-CM | POA: Diagnosis not present

## 2019-10-15 DIAGNOSIS — F0281 Dementia in other diseases classified elsewhere with behavioral disturbance: Secondary | ICD-10-CM | POA: Diagnosis present

## 2019-10-15 DIAGNOSIS — M24561 Contracture, right knee: Secondary | ICD-10-CM | POA: Diagnosis present

## 2019-10-15 DIAGNOSIS — G309 Alzheimer's disease, unspecified: Secondary | ICD-10-CM | POA: Diagnosis present

## 2019-10-15 DIAGNOSIS — R569 Unspecified convulsions: Secondary | ICD-10-CM | POA: Diagnosis present

## 2019-10-15 DIAGNOSIS — M25562 Pain in left knee: Secondary | ICD-10-CM | POA: Diagnosis not present

## 2019-10-15 DIAGNOSIS — L089 Local infection of the skin and subcutaneous tissue, unspecified: Secondary | ICD-10-CM | POA: Diagnosis not present

## 2019-10-15 DIAGNOSIS — M25552 Pain in left hip: Secondary | ICD-10-CM

## 2019-10-15 DIAGNOSIS — E872 Acidosis, unspecified: Secondary | ICD-10-CM

## 2019-10-15 DIAGNOSIS — L98491 Non-pressure chronic ulcer of skin of other sites limited to breakdown of skin: Secondary | ICD-10-CM

## 2019-10-15 DIAGNOSIS — M24552 Contracture, left hip: Secondary | ICD-10-CM | POA: Diagnosis present

## 2019-10-15 DIAGNOSIS — L89320 Pressure ulcer of left buttock, unstageable: Secondary | ICD-10-CM | POA: Diagnosis present

## 2019-10-15 DIAGNOSIS — R638 Other symptoms and signs concerning food and fluid intake: Secondary | ICD-10-CM

## 2019-10-15 DIAGNOSIS — R627 Adult failure to thrive: Secondary | ICD-10-CM | POA: Diagnosis present

## 2019-10-15 DIAGNOSIS — Z882 Allergy status to sulfonamides status: Secondary | ICD-10-CM

## 2019-10-15 DIAGNOSIS — Z515 Encounter for palliative care: Secondary | ICD-10-CM

## 2019-10-15 DIAGNOSIS — M1611 Unilateral primary osteoarthritis, right hip: Secondary | ICD-10-CM | POA: Diagnosis present

## 2019-10-15 DIAGNOSIS — E440 Moderate protein-calorie malnutrition: Secondary | ICD-10-CM | POA: Diagnosis present

## 2019-10-15 DIAGNOSIS — Z6836 Body mass index (BMI) 36.0-36.9, adult: Secondary | ICD-10-CM

## 2019-10-15 DIAGNOSIS — A419 Sepsis, unspecified organism: Principal | ICD-10-CM

## 2019-10-15 DIAGNOSIS — J45909 Unspecified asthma, uncomplicated: Secondary | ICD-10-CM

## 2019-10-15 DIAGNOSIS — L89620 Pressure ulcer of left heel, unstageable: Secondary | ICD-10-CM | POA: Diagnosis present

## 2019-10-15 DIAGNOSIS — J4520 Mild intermittent asthma, uncomplicated: Secondary | ICD-10-CM | POA: Diagnosis not present

## 2019-10-15 DIAGNOSIS — E876 Hypokalemia: Secondary | ICD-10-CM | POA: Diagnosis not present

## 2019-10-15 DIAGNOSIS — R6520 Severe sepsis without septic shock: Secondary | ICD-10-CM | POA: Diagnosis not present

## 2019-10-15 DIAGNOSIS — M47816 Spondylosis without myelopathy or radiculopathy, lumbar region: Secondary | ICD-10-CM | POA: Diagnosis present

## 2019-10-15 DIAGNOSIS — E860 Dehydration: Secondary | ICD-10-CM | POA: Diagnosis present

## 2019-10-15 DIAGNOSIS — M5136 Other intervertebral disc degeneration, lumbar region: Secondary | ICD-10-CM | POA: Diagnosis present

## 2019-10-15 DIAGNOSIS — M25561 Pain in right knee: Secondary | ICD-10-CM

## 2019-10-15 DIAGNOSIS — L8990 Pressure ulcer of unspecified site, unspecified stage: Secondary | ICD-10-CM

## 2019-10-15 DIAGNOSIS — Z7401 Bed confinement status: Secondary | ICD-10-CM

## 2019-10-15 DIAGNOSIS — F0280 Dementia in other diseases classified elsewhere without behavioral disturbance: Secondary | ICD-10-CM | POA: Diagnosis not present

## 2019-10-15 DIAGNOSIS — M24562 Contracture, left knee: Secondary | ICD-10-CM | POA: Diagnosis present

## 2019-10-15 DIAGNOSIS — Z8673 Personal history of transient ischemic attack (TIA), and cerebral infarction without residual deficits: Secondary | ICD-10-CM

## 2019-10-15 DIAGNOSIS — M4317 Spondylolisthesis, lumbosacral region: Secondary | ICD-10-CM | POA: Diagnosis present

## 2019-10-15 DIAGNOSIS — L89154 Pressure ulcer of sacral region, stage 4: Secondary | ICD-10-CM | POA: Diagnosis present

## 2019-10-15 DIAGNOSIS — I10 Essential (primary) hypertension: Secondary | ICD-10-CM | POA: Diagnosis present

## 2019-10-15 DIAGNOSIS — Z781 Physical restraint status: Secondary | ICD-10-CM

## 2019-10-15 DIAGNOSIS — L89896 Pressure-induced deep tissue damage of other site: Secondary | ICD-10-CM | POA: Diagnosis present

## 2019-10-15 DIAGNOSIS — Z91013 Allergy to seafood: Secondary | ICD-10-CM

## 2019-10-15 LAB — CBC WITH DIFFERENTIAL/PLATELET
Abs Immature Granulocytes: 0.11 10*3/uL — ABNORMAL HIGH (ref 0.00–0.07)
Basophils Absolute: 0 10*3/uL (ref 0.0–0.1)
Basophils Relative: 0 %
Eosinophils Absolute: 0.1 10*3/uL (ref 0.0–0.5)
Eosinophils Relative: 1 %
HCT: 48.9 % — ABNORMAL HIGH (ref 36.0–46.0)
Hemoglobin: 15.6 g/dL — ABNORMAL HIGH (ref 12.0–15.0)
Immature Granulocytes: 1 %
Lymphocytes Relative: 10 %
Lymphs Abs: 1.3 10*3/uL (ref 0.7–4.0)
MCH: 30.8 pg (ref 26.0–34.0)
MCHC: 31.9 g/dL (ref 30.0–36.0)
MCV: 96.6 fL (ref 80.0–100.0)
Monocytes Absolute: 0.6 10*3/uL (ref 0.1–1.0)
Monocytes Relative: 5 %
Neutro Abs: 10 10*3/uL — ABNORMAL HIGH (ref 1.7–7.7)
Neutrophils Relative %: 83 %
Platelets: 302 10*3/uL (ref 150–400)
RBC: 5.06 MIL/uL (ref 3.87–5.11)
RDW: 13.9 % (ref 11.5–15.5)
WBC: 12 10*3/uL — ABNORMAL HIGH (ref 4.0–10.5)
nRBC: 0 % (ref 0.0–0.2)

## 2019-10-15 LAB — I-STAT CHEM 8, ED
BUN: 17 mg/dL (ref 8–23)
Calcium, Ion: 1.17 mmol/L (ref 1.15–1.40)
Chloride: 103 mmol/L (ref 98–111)
Creatinine, Ser: 0.4 mg/dL — ABNORMAL LOW (ref 0.44–1.00)
Glucose, Bld: 132 mg/dL — ABNORMAL HIGH (ref 70–99)
HCT: 55 % — ABNORMAL HIGH (ref 36.0–46.0)
Hemoglobin: 18.7 g/dL — ABNORMAL HIGH (ref 12.0–15.0)
Potassium: 3.5 mmol/L (ref 3.5–5.1)
Sodium: 142 mmol/L (ref 135–145)
TCO2: 28 mmol/L (ref 22–32)

## 2019-10-15 LAB — COMPREHENSIVE METABOLIC PANEL
ALT: 21 U/L (ref 0–44)
AST: 30 U/L (ref 15–41)
Albumin: 3 g/dL — ABNORMAL LOW (ref 3.5–5.0)
Alkaline Phosphatase: 97 U/L (ref 38–126)
Anion gap: 13 (ref 5–15)
BUN: 18 mg/dL (ref 8–23)
CO2: 28 mmol/L (ref 22–32)
Calcium: 8.8 mg/dL — ABNORMAL LOW (ref 8.9–10.3)
Chloride: 102 mmol/L (ref 98–111)
Creatinine, Ser: 0.5 mg/dL (ref 0.44–1.00)
GFR calc Af Amer: >60 mL/min (ref >60)
GFR calc non Af Amer: >60 mL/min (ref >60)
Glucose, Bld: 141 mg/dL — ABNORMAL HIGH (ref 70–99)
Potassium: 3.5 mmol/L (ref 3.5–5.1)
Sodium: 143 mmol/L (ref 135–145)
Total Bilirubin: 1.6 mg/dL — ABNORMAL HIGH (ref 0.3–1.2)
Total Protein: 7.3 g/dL (ref 6.5–8.1)

## 2019-10-15 LAB — PROTIME-INR
INR: 1.2 (ref 0.8–1.2)
Prothrombin Time: 14.3 seconds (ref 11.4–15.2)

## 2019-10-15 LAB — LACTIC ACID, PLASMA
Lactic Acid, Venous: 3.3 mmol/L (ref 0.5–1.9)
Lactic Acid, Venous: 1.7 mmol/L (ref 0.5–1.9)

## 2019-10-15 LAB — URINALYSIS, ROUTINE W REFLEX MICROSCOPIC
Bilirubin Urine: NEGATIVE
Glucose, UA: NEGATIVE mg/dL
Hgb urine dipstick: NEGATIVE
Ketones, ur: 5 mg/dL — AB
Nitrite: POSITIVE — AB
Protein, ur: NEGATIVE mg/dL
Specific Gravity, Urine: >1.046 — ABNORMAL HIGH (ref 1.005–1.030)
pH: 8 (ref 5.0–8.0)

## 2019-10-15 LAB — I-STAT BETA HCG BLOOD, ED (NOT ORDERABLE): I-stat hCG, quantitative: <5 m[IU]/mL (ref <5)

## 2019-10-15 LAB — I-STAT BETA HCG BLOOD, ED (MC, WL, AP ONLY): I-stat hCG, quantitative: <5 m[IU]/mL (ref <5)

## 2019-10-15 LAB — MRSA PCR SCREENING: MRSA by PCR: NEGATIVE

## 2019-10-15 LAB — SARS CORONAVIRUS 2 BY RT PCR (HOSPITAL ORDER, PERFORMED IN ~~LOC~~ HOSPITAL LAB): SARS Coronavirus 2: NEGATIVE

## 2019-10-15 LAB — APTT: aPTT: 34 seconds (ref 24–36)

## 2019-10-15 MED ORDER — ALBUTEROL SULFATE (2.5 MG/3ML) 0.083% IN NEBU: 2.5000 mg | INHALATION_SOLUTION | Freq: Four times a day (QID) | RESPIRATORY_TRACT | Status: DC | PRN

## 2019-10-15 MED ORDER — ACETAMINOPHEN 325 MG PO TABS
650.0000 mg | ORAL_TABLET | Freq: Four times a day (QID) | ORAL | Status: DC | PRN
  Administered 2019-10-15 – 2019-10-20 (×2): 650 mg via ORAL
  Filled 2019-10-15 (×2): qty 2

## 2019-10-15 MED ORDER — IOHEXOL 300 MG/ML  SOLN
100.0000 mL | Freq: Once | INTRAMUSCULAR | Status: AC | PRN
  Administered 2019-10-15: 100 mL via INTRAVENOUS

## 2019-10-15 MED ORDER — ENSURE ENLIVE PO LIQD
237.0000 mL | Freq: Three times a day (TID) | ORAL | Status: DC
  Administered 2019-10-15 – 2019-10-26 (×25): 237 mL via ORAL

## 2019-10-15 MED ORDER — SODIUM CHLORIDE (PF) 0.9 % IJ SOLN
INTRAMUSCULAR | Status: AC
  Filled 2019-10-15: qty 50

## 2019-10-15 MED ORDER — LORAZEPAM 2 MG/ML IJ SOLN
1.0000 mg | Freq: Once | INTRAMUSCULAR | Status: AC
  Administered 2019-10-15: 1 mg via INTRAVENOUS
  Filled 2019-10-15: qty 1

## 2019-10-15 MED ORDER — SODIUM CHLORIDE 0.9% FLUSH
3.0000 mL | Freq: Two times a day (BID) | INTRAVENOUS | Status: DC
  Administered 2019-10-15 – 2019-10-26 (×19): 3 mL via INTRAVENOUS

## 2019-10-15 MED ORDER — METRONIDAZOLE IN NACL 5-0.79 MG/ML-% IV SOLN
500.0000 mg | Freq: Once | INTRAVENOUS | Status: AC
  Administered 2019-10-15: 500 mg via INTRAVENOUS
  Filled 2019-10-15: qty 100

## 2019-10-15 MED ORDER — VENLAFAXINE HCL 75 MG PO TABS
75.0000 mg | ORAL_TABLET | Freq: Two times a day (BID) | ORAL | Status: DC
  Administered 2019-10-15 – 2019-10-26 (×22): 75 mg via ORAL
  Filled 2019-10-15 (×25): qty 1

## 2019-10-15 MED ORDER — METRONIDAZOLE IN NACL 5-0.79 MG/ML-% IV SOLN
500.0000 mg | Freq: Three times a day (TID) | INTRAVENOUS | Status: AC
  Administered 2019-10-15 – 2019-10-24 (×27): 500 mg via INTRAVENOUS
  Filled 2019-10-15 (×27): qty 100

## 2019-10-15 MED ORDER — SODIUM CHLORIDE 0.9 % IV BOLUS (SEPSIS)
1000.0000 mL | Freq: Once | INTRAVENOUS | Status: AC
  Administered 2019-10-15: 1000 mL via INTRAVENOUS

## 2019-10-15 MED ORDER — ZINC SULFATE 220 (50 ZN) MG PO CAPS
220.0000 mg | ORAL_CAPSULE | Freq: Every day | ORAL | Status: DC
  Administered 2019-10-15 – 2019-10-26 (×12): 220 mg via ORAL
  Filled 2019-10-15 (×11): qty 1

## 2019-10-15 MED ORDER — MORPHINE SULFATE (PF) 2 MG/ML IV SOLN
1.0000 mg | INTRAVENOUS | Status: DC | PRN
  Administered 2019-10-17 – 2019-10-22 (×4): 1 mg via INTRAVENOUS
  Filled 2019-10-15 (×4): qty 1

## 2019-10-15 MED ORDER — HYDROCODONE-ACETAMINOPHEN 5-325 MG PO TABS
1.0000 | ORAL_TABLET | ORAL | Status: DC | PRN
  Administered 2019-10-21 (×2): 2 via ORAL
  Administered 2019-10-23 – 2019-10-24 (×2): 1 via ORAL
  Administered 2019-10-24 – 2019-10-25 (×2): 2 via ORAL
  Administered 2019-10-25: 1 via ORAL
  Administered 2019-10-25: 2 via ORAL
  Filled 2019-10-15 (×3): qty 2
  Filled 2019-10-15: qty 1
  Filled 2019-10-15 (×2): qty 2
  Filled 2019-10-15 (×2): qty 1

## 2019-10-15 MED ORDER — ENOXAPARIN SODIUM 40 MG/0.4ML ~~LOC~~ SOLN
40.0000 mg | SUBCUTANEOUS | Status: DC
  Administered 2019-10-15 – 2019-10-26 (×12): 40 mg via SUBCUTANEOUS
  Filled 2019-10-15 (×12): qty 0.4

## 2019-10-15 MED ORDER — CHLORHEXIDINE GLUCONATE CLOTH 2 % EX PADS: 6.0000 | MEDICATED_PAD | Freq: Every day | CUTANEOUS | Status: DC

## 2019-10-15 MED ORDER — SODIUM CHLORIDE 0.9 % IV SOLN
INTRAVENOUS | Status: DC | PRN
  Administered 2019-10-15 – 2019-10-20 (×3): 250 mL via INTRAVENOUS
  Administered 2019-10-23: 1000 mL via INTRAVENOUS

## 2019-10-15 MED ORDER — SODIUM CHLORIDE 0.9 % IV SOLN
1000.0000 mL | INTRAVENOUS | Status: DC
  Administered 2019-10-15: 1000 mL via INTRAVENOUS

## 2019-10-15 MED ORDER — PRO-STAT SUGAR FREE PO LIQD
30.0000 mL | Freq: Three times a day (TID) | ORAL | Status: DC
  Administered 2019-10-15 – 2019-10-26 (×23): 30 mL via ORAL
  Filled 2019-10-15 (×23): qty 30

## 2019-10-15 MED ORDER — SODIUM CHLORIDE 0.9 % IV SOLN
2.0000 g | Freq: Once | INTRAVENOUS | Status: AC
  Administered 2019-10-15: 2 g via INTRAVENOUS
  Filled 2019-10-15: qty 2

## 2019-10-15 MED ORDER — ACETAMINOPHEN 650 MG RE SUPP: 650.0000 mg | Freq: Four times a day (QID) | RECTAL | Status: DC | PRN

## 2019-10-15 MED ORDER — SODIUM CHLORIDE 0.9 % IV SOLN: INTRAVENOUS | Status: DC

## 2019-10-15 MED ORDER — VANCOMYCIN HCL 1500 MG/300ML IV SOLN
1500.0000 mg | Freq: Once | INTRAVENOUS | Status: AC
  Administered 2019-10-15: 1500 mg via INTRAVENOUS
  Filled 2019-10-15: qty 300

## 2019-10-15 MED ORDER — VANCOMYCIN HCL IN DEXTROSE 1-5 GM/200ML-% IV SOLN: 1000.0000 mg | Freq: Once | INTRAVENOUS | Status: DC

## 2019-10-15 MED ORDER — VANCOMYCIN HCL IN DEXTROSE 1-5 GM/200ML-% IV SOLN
1000.0000 mg | INTRAVENOUS | Status: DC
  Administered 2019-10-16 – 2019-10-19 (×4): 1000 mg via INTRAVENOUS
  Filled 2019-10-15 (×4): qty 200

## 2019-10-15 MED ORDER — SODIUM CHLORIDE 0.9 % IV SOLN
2.0000 g | Freq: Three times a day (TID) | INTRAVENOUS | Status: DC
  Administered 2019-10-15 – 2019-10-19 (×11): 2 g via INTRAVENOUS
  Filled 2019-10-15 (×13): qty 2

## 2019-10-15 MED ORDER — TAB-A-VITE/IRON PO TABS
1.0000 | ORAL_TABLET | Freq: Every day | ORAL | Status: DC
  Administered 2019-10-15 – 2019-10-26 (×12): 1 via ORAL
  Filled 2019-10-15 (×12): qty 1

## 2019-10-15 MED ORDER — COLLAGENASE 250 UNIT/GM EX OINT
TOPICAL_OINTMENT | Freq: Every day | CUTANEOUS | Status: DC
  Filled 2019-10-15 (×3): qty 30

## 2019-10-15 MED ORDER — VITAMIN C 500 MG/5ML PO SYRP
500.0000 mg | ORAL_SOLUTION | Freq: Two times a day (BID) | ORAL | Status: DC
  Administered 2019-10-15 – 2019-10-26 (×21): 500 mg via ORAL
  Filled 2019-10-15 (×23): qty 5

## 2019-10-15 MED ORDER — CHLORHEXIDINE GLUCONATE CLOTH 2 % EX PADS
6.0000 | MEDICATED_PAD | Freq: Every day | CUTANEOUS | Status: DC
  Administered 2019-10-15 – 2019-10-26 (×12): 6 via TOPICAL

## 2019-10-15 NOTE — Evaluation (Signed)
Physical Therapy Evaluation Patient Details Name: Lacey Cook MRN: [id number] DOB: Dec 09, 1955 Today's Date: 10/15/2019   History of Present Illness  64 y.o. female with medical history significant of dementia. admitted from SNF with multiple wounds/sepsis  Clinical Impression  Patient evaluated by Physical Therapy with no further acute PT needs identified.   See below for any follow-up Physical Therapy or equipment needs. PT is signing off. Thank you for this referral. Pt  Presents with bil LE contractures, she is reaching into the air upon PT arrival as if she is hallucinating.  She is unable to follow commands, is resistant to imposed movement and requires +2 total assist to roll R and L, grimacing as if in pain. Says "ouch" to moderate pressure/touch   Pt is not a rehab candidate at this time.  Would possibly benefit from Palliative Care Consult to assist pt/family with [place].   Will continue to follow for hydrotherapy and if mental status change is noted we can re-eval if indicated.     Follow Up Recommendations SNF;Supervision/Assistance - 24 hour    Equipment Recommendations  None recommended by PT    Recommendations for Other Services       Precautions / Restrictions Precautions Precautions: Fall Restrictions Weight Bearing Restrictions: No      Mobility  Bed Mobility Overal bed mobility: Needs Assistance Bed Mobility: Rolling Rolling: +2 for physical assistance;Total assist;+2 for safety/equipment         General bed mobility comments: pt requires +2 total assist to roll right and left. total assist to scoot up to [facility] in supine  Transfers                    Ambulation/Gait                Stairs            Wheelchair Mobility    Modified Rankin (Stroke Patients Only)       Balance Overall balance assessment: (NT)                                           Pertinent Vitals/Pain Pain Assessment: Faces Faces Pain  Scale: Hurts even more Pain Location: generalized to firm tounc or any movement Pain Intervention(s): Limited activity within patient's tolerance;Monitored during session;Repositioned    Home Living Family/patient expects to be discharged to:: Skilled nursing facility                 Additional Comments: per chart from SNF Unable to obtain home setup and PLOF data from pt.    Prior Function Level of Independence: Needs assistance   Gait / Transfers Assistance Needed: per previous notes pt was amb with [facility] ~3+mos ago -?? no family present. current H&P states pt was "locked up" and not "engaged"  at SNF per pt dtr           Hand Dominance        Extremity/Trunk Assessment   Upper Extremity Assessment Upper Extremity Assessment: Defer to OT evaluation    Lower Extremity Assessment Lower Extremity Assessment: RLE deficits/detail;LLE deficits/detail RLE Deficits / Details: bil LE flexion contractures. ankles at neutral. pt is resistant to imposed movement LLE Deficits / Details: same as above       Communication      Cognition Arousal/Alertness: Awake/alert Behavior During Therapy: Flat affect Overall Cognitive Status: History  of cognitive impairments - at baseline                                 General Comments: unable to follow commands during PT eval and hydro eval      General Comments      Exercises     Assessment/Plan    PT Assessment Patent does not need any further PT services  PT Problem List         PT Treatment Interventions      PT Goals (Current goals can be found in the Care Plan section)  Acute Rehab PT Goals Patient Stated Goal: unable to state PT Goal Formulation: All assessment and education complete, DC therapy    Frequency     Barriers to discharge        Co-evaluation               AM-PAC PT "6 Clicks" Mobility  Outcome Measure Help needed turning from your back to your side while in a flat bed  without using bedrails?: Total Help needed moving from lying on your back to sitting on the side of a flat bed without using bedrails?: Total Help needed moving to and from a bed to a chair (including a wheelchair)?: Total Help needed standing up from a chair using your arms (e.g., wheelchair or bedside chair)?: Total Help needed to walk in hospital room?: Total Help needed climbing 3-5 steps with a railing? : Total 6 Click Score: 6    End of Session   Activity Tolerance: Other (comment)(limited by cognition) Patient left: in bed;with call bell/phone within reach;with bed alarm set   PT Visit Diagnosis: Other abnormalities of gait and mobility (R26.89)    Time: 1638-4536 PT Time Calculation (min) (ACUTE ONLY): 14 min   Charges:   PT Evaluation $PT Eval Low Complexity: 1 Low          Vincen Bejar, PT   Acute Rehab Dept [facility]): [phone]   10/15/2019   [facility] 10/15/2019, 5:19 PM

## 2019-10-15 NOTE — Progress Notes (Signed)
Pharmacy Antibiotic Note  Lacey Cook is a 64 y.o. female admitted on 10/15/2019 with sepsis secondary to pressure ulcers. Pharmacy has been consulted for cefepime and vancomycin dosing.  Plan: Cefepime 2gm IV q8h Vancomycin 1500mg  IV x 1 in ED then 1gm q24h (AUC 450.4, used Scr 0.8) Follow renal function and clinical course  Height: 5\' 1"  (154.9 cm) Weight: 88.7 kg (195 lb 8 oz) IBW/kg (Calculated) : 47.8  Temp (24hrs), Avg:98.2 F (36.8 C), Min:98.1 F (36.7 C), Max:98.3 F (36.8 C)  Recent Labs  Lab 10/15/19 0144 10/15/19 0257 10/15/19 0344  WBC 12.0*  --   --   CREATININE 0.50 0.40*  --   LATICACIDVEN 3.3*  --  1.7    Estimated Creatinine Clearance: 73.9 mL/min (A) (by C-G formula based on SCr of 0.4 mg/dL (L)).    Allergies  Allergen Reactions  . Shellfish Allergy Anaphylaxis    Throat swells  . Sulfa Antibiotics Anaphylaxis    Throat Swelling    Antimicrobials this admission: 5/21 cefepime >> 5/21 vancomycin >> Dose adjustments this admission:   Microbiology results: 5/21 UCx:   5/21 MRSA PCR:   Thank you for allowing pharmacy to be a part of this patient's care.  Dolly Rias RPh 10/15/2019, 10:48 AM

## 2019-10-15 NOTE — Progress Notes (Signed)
Initial Nutrition Assessment  DOCUMENTATION CODES:   Obesity unspecified  INTERVENTION:  - will order Ensure Enlive TID, each supplement provides 350 kcal and 20 grams of protein. - will order 30 ml prostat TID. - will order 1 tablet multivitamin with minerals/day. - will order juven BID once sepsis begins to improve. - recommend 220 mg zinc sulfate x10-14 days and 500 mg ascorbic acid BID to promote wound healing.   NUTRITION DIAGNOSIS:   Increased nutrient needs related to wound healing, acute illness(sepsis) as evidenced by estimated needs.  GOAL:   Patient will meet greater than or equal to 90% of their needs  MONITOR:   PO intake, Supplement acceptance, Labs, Weight trends  REASON FOR ASSESSMENT:   Consult Wound healing  ASSESSMENT:   64 y.o. female with medical history of dementia. Patient was recently at [facility].  She was brought to the ED by her daughter who reported progressive weakness, worsening mentation, decreasing appetite, and a sacral pressure injury that "popped."  Patient noted to be disoriented, unable to provide information. Patient was admitted 13 hours ago and no intakes have been documented since diet advancement from NPO to Soft today at 1120.   Per chart review, weight today is 195 lb and weight on 11/24/18 was 194 lb.   Per notes: - sepsis 2/2 pressure injuries, infection to sacral wound and bilateral knee wounds - lactic acidosis - poor living situation - hx of Alzheimer's dementia - chronic hip and knee pain with contractures - CT abdomen/pelvis showed severe R hip osteoarthritis, diffuse lumbar and lower thoracic spine degenerative disc disease - likely plan for SNF at the time of d/c   Labs reviewed; creatinine: 0.4 mg/dl. Medications reviewed. IVF; NS @ 100 ml/hr.   NUTRITION - FOCUSED PHYSICAL EXAM:  unable to complete at this time.  Diet Order:   Diet Order            DIET SOFT Room service appropriate? Yes; Fluid consistency:  Thin  Diet effective now              EDUCATION NEEDS:   Not appropriate for education at this time  Skin:  Skin Assessment: Skin Integrity Issues: Skin Integrity Issues:: DTI, Stage II, Stage IV, Unstageable DTI: R heel x2; bilateral knees; L pretibial area Stage II: L IT Stage IV: sacrum Unstageable: L heel; L buttocks  Last BM:  PTA/unknown  Height:   Ht Readings from Last 1 Encounters:  10/15/19 5\' 1"  (1.549 m)    Weight:   Wt Readings from Last 1 Encounters:  10/15/19 88.7 kg     Estimated Nutritional Needs:  Kcal:  2500-2700 kcal Protein:  130-140 grams Fluid:  >/= 2.5 L/day     Jarome Matin, MS, RD, LDN, CNSC Inpatient Clinical Dietitian RD pager # available in [facility]  After hours/weekend pager # available in [facility]

## 2019-10-15 NOTE — ED Notes (Signed)
Patient taken to CT by CT staff.

## 2019-10-15 NOTE — ED Triage Notes (Signed)
Pt BIB EMS from home with c/c of multiple pressure ulcers that have progressively gotten worse. EMS reports pt is cared for by her family. Pt alert, but disoriented x 4 which is reported to be baseline.

## 2019-10-15 NOTE — ED Provider Notes (Signed)
[facility] DEPT Provider Note  CSN: [id number] Arrival date & time: 10/15/19 4174  Chief Complaint(s) Skin Ulcer  HPI Lacey Cook is a 64 y.o. female with a past medical history listed below including Alzheimer's dementia who presents to the emergency department from home for worsening pressure ulcers.  Patient is accompanied by the daughter who reports worsening wounds despite their best efforts to manage.  The patient's daughter has dwarfism and has difficulty maneuvering the patient.  Daughter reports that the patient frequently picks at her wounds.  She also reports that she plays with her feces and at times eats it as well.  Over the last several days the patient has had decreased oral intake though she has been drinking Ensure.  The patient was sent home from a skilled nursing facility/rehab 1 month ago.  Remainder of history, ROS, and physical exam limited due to patient's condition (dementia). Additional information was obtained from family.   Level V Caveat.    HPI  Past Medical History Past Medical History:  Diagnosis Date  . Chronic right hip pain   . Diabetes mellitus without complication ([place])   . Family history of malignant hyperthermia   . Hypertension   . Renal disorder   . Seizures ([place])   . Stroke [facility])    Patient Active Problem List   Diagnosis Date Noted  . Infected pressure ulcer 10/15/2019  . Pressure injury of skin 07/27/2019  . Cellulitis of knee 07/21/2019  . Effusion of right knee 07/21/2019  . Alzheimer's dementia ([place]) 07/21/2019  . Elevated LFTs 07/21/2019  . Encounter for medication review 12/17/2018  . Arthritis 11/30/2018  . Intertriginous dermatitis associated with moisture 11/30/2018  . Asthma 11/30/2018  . Depression, recurrent ([place]) 11/30/2018  . Adjustment disorder with disturbance of emotion 12/07/2017   Home Medication(s) Prior to Admission medications   Medication Sig Start Date End Date Taking?  Authorizing Provider  acetaminophen (TYLENOL) 500 MG tablet Take 1,000 mg by mouth every 6 (six) hours as needed for moderate pain.   Yes [provider]  albuterol (VENTOLIN HFA) 108 (90 Base) MCG/ACT inhaler Inhale 2 puffs into the lungs every 6 (six) hours as needed for wheezing or shortness of breath. 11/24/18  Yes Anderson, Chelsey L, DO  ascorbic acid (VITAMIN C) 500 MG tablet Take 500 mg by mouth daily.   Yes [provider]  Multiple Vitamins-Minerals (MULTIVITAMIN WITH MINERALS) tablet Take 1 tablet by mouth daily.   Yes [provider]  venlafaxine (EFFEXOR) 75 MG tablet Take 1 tablet (75 mg total) by mouth 2 (two) times daily. 12/14/18  Yes Anderson, Chelsey L, DO  Amino Acids-Protein Hydrolys (FEEDING SUPPLEMENT, PRO-STAT SUGAR FREE 64,) LIQD Take 30 mLs by mouth daily.    [provider]  HYDROcodone-acetaminophen (NORCO/VICODIN) 5-325 MG tablet Take 1-2 tablets by mouth every 4 (four) hours as needed for moderate pain. 07/29/19   Caren Griffins, MD  Past Surgical History Past Surgical History:  Procedure Laterality Date  . APPLICATION OF A-CELL OF EXTREMITY Bilateral 07/27/2019   Procedure: placement of Acell;  Surgeon: Wallace Going, DO;  Location: [facility]S;  Service: Plastics;  Laterality: Bilateral;  . I & D EXTREMITY Bilateral 07/27/2019   Procedure: Debridement of bilateral knee wounds;  Surgeon: Wallace Going, DO;  Location: [facility]S;  Service: Plastics;  Laterality: Bilateral;  1 hour, please   Family History History reviewed. No pertinent family history.  Social History Social History   Tobacco Use  . Smoking status: Never Smoker  . Smokeless tobacco: Never Used  Substance Use Topics  . Alcohol use: No  . Drug use: Never   Allergies Shellfish allergy and Sulfa antibiotics  Review of  Systems Review of Systems  Unable to perform ROS: Dementia    Physical Exam Vital Signs  I have reviewed the triage vital signs BP 125/72 (BP Location: Right Arm)   Pulse (!) 134   Temp 98.3 F (36.8 C) (Rectal)   Resp 16   Ht 5\' 1"  (1.549 m)   Wt 88.7 kg   SpO2 97%   BMI 36.94 kg/m   Physical Exam Vitals reviewed.  Constitutional:      General: She is not in acute distress.    Appearance: She is well-developed. She is not diaphoretic.  HENT:     Head: Normocephalic and atraumatic.     Nose: Nose normal.     Mouth/Throat:     Mouth: Mucous membranes are dry.     Dentition: Abnormal dentition. Dental caries present.  Eyes:     General: No scleral icterus.       Right eye: No discharge.        Left eye: No discharge.     Conjunctiva/sclera: Conjunctivae normal.     Pupils: Pupils are equal, round, and reactive to light.  Cardiovascular:     Rate and Rhythm: Regular rhythm. Tachycardia present.     Heart sounds: No murmur. No friction rub. No gallop.   Pulmonary:     Effort: Pulmonary effort is normal. No respiratory distress.     Breath sounds: Normal breath sounds. No stridor. No rales.  Abdominal:     General: There is no distension.     Palpations: Abdomen is soft.     Tenderness: There is no abdominal tenderness.  Genitourinary:   Musculoskeletal:        General: No tenderness.     Cervical back: Normal range of motion and neck supple.  Skin:    General: Skin is warm and dry.     Findings: No erythema or rash.       Neurological:     Mental Status: She is alert and oriented to person, place, and time.     ED Results and Treatments Labs (all labs ordered are listed, but only abnormal results are displayed) Labs Reviewed  LACTIC ACID, PLASMA - Abnormal; Notable for the following components:      Result Value   Lactic Acid, Venous 3.3 (*)    All other components within normal limits  COMPREHENSIVE METABOLIC PANEL - Abnormal; Notable for the  following components:   Glucose, Bld 141 (*)    Calcium 8.8 (*)    Albumin 3.0 (*)    Total Bilirubin 1.6 (*)    All other components within normal limits  CBC WITH DIFFERENTIAL/PLATELET - Abnormal; Notable for the following components:   WBC 12.0 (*)  Hemoglobin 15.6 (*)    HCT 48.9 (*)    Neutro Abs 10.0 (*)    Abs Immature Granulocytes 0.11 (*)    All other components within normal limits  I-STAT CHEM 8, ED - Abnormal; Notable for the following components:   Creatinine, Ser 0.40 (*)    Glucose, Bld 132 (*)    Hemoglobin 18.7 (*)    HCT 55.0 (*)    All other components within normal limits  SARS CORONAVIRUS 2 BY RT PCR (HOSPITAL ORDER, [facility])  CULTURE, BLOOD (ROUTINE X 2)  CULTURE, BLOOD (ROUTINE X 2)  URINE CULTURE  LACTIC ACID, PLASMA  APTT  PROTIME-INR  URINALYSIS, ROUTINE W REFLEX MICROSCOPIC  I-STAT BETA HCG BLOOD, ED ([facility], [facility], [facility] ONLY)                                                                                                                         EKG  EKG Interpretation  Date/Time:  Friday Oct 15 2019 02:21:26 EDT Ventricular Rate:  126 PR Interval:    QRS Duration: 82 QT Interval:  331 QTC Calculation: 480 R Axis:   -64 Text Interpretation: Sinus tachycardia Left anterior fascicular block Abnormal R-wave progression, late transition Since last tracing rate faster Otherwise no significant change Confirmed by Addison Lank [phone]) on 10/15/2019 3:41:04 AM      Radiology CT ABDOMEN PELVIS W CONTRAST  Result Date: 10/15/2019 CLINICAL DATA:  Assess extent of sacral wound EXAM: CT ABDOMEN AND PELVIS WITH CONTRAST TECHNIQUE: Multidetector CT imaging of the abdomen and pelvis was performed using the standard protocol following bolus administration of intravenous contrast. CONTRAST:  130mL OMNIPAQUE IOHEXOL 300 MG/ML  SOLN COMPARISON:  None. FINDINGS: Lower chest:  No contributory findings. Hepatobiliary: No focal liver  abnormality.No evidence of biliary obstruction or stone. Pancreas: Unremarkable pancreas. Small duodenal diverticulum at the pancreatic head. Spleen: Unremarkable. Adrenals/Urinary Tract: Negative adrenals. No hydronephrosis or stone. Unremarkable bladder. Stomach/Bowel:  No obstruction. No appendicitis. Vascular/Lymphatic: No acute vascular abnormality. No mass or adenopathy. Reproductive:Unremarkable for age Other: No ascites or pneumoperitoneum. Musculoskeletal: Sacral decubitus ulcer is seen over the lower sacrum with tracking leftward in the subcutaneous fat by 2 cm. No collection or bony erosion seen. Severe right hip osteoarthritis. Diffuse lumbar and lower thoracic degenerative disc narrowing with lumbar facet arthritis and L4-5, L5-S1 anterolisthesis. IMPRESSION: 1. Lower sacral decubitus ulcer with leftward subcutaneous tracking by 2 cm. No collection or visible osteomyelitis. 2. Advanced right hip and lumbar spine degeneration. Electronically Signed   By: Monte Fantasia M.D.   On: 10/15/2019 04:49   DG Chest Port 1 View  Result Date: 10/15/2019 CLINICAL DATA:  Tachycardia EXAM: PORTABLE CHEST 1 VIEW COMPARISON:  07/20/2018 FINDINGS: The patient's chin obscures the apices. Hazy left lung opacity possibly due to rotation and soft tissue. No consolidation, pleural effusion or pneumothorax. IMPRESSION: Mild asymmetric hazy left thoracic opacity, suspected to be secondary to rotation and overlying soft tissues. No consolidative  airspace disease. Electronically Signed   By: Donavan Foil M.D.   On: 10/15/2019 02:39    Pertinent labs & imaging results that were available during my care of the patient were reviewed by me and considered in my medical decision making (see chart for details).  Medications Ordered in ED Medications  sodium chloride 0.9 % bolus 1,000 mL (0 mLs Intravenous Stopped 10/15/19 0416)    Followed by  sodium chloride 0.9 % bolus 1,000 mL (0 mLs Intravenous Stopped 10/15/19 0416)     Followed by  0.9 %  sodium chloride infusion (1,000 mLs Intravenous New Bag/Given 10/15/19 0218)  sodium chloride (PF) 0.9 % injection (has no administration in time range)  vancomycin (VANCOREADY) IVPB 1500 mg/300 mL (1,500 mg Intravenous New Bag/Given 10/15/19 0459)  LORazepam (ATIVAN) injection 1 mg (1 mg Intravenous Given 10/15/19 0322)  ceFEPIme (MAXIPIME) 2 g in sodium chloride 0.9 % 100 mL IVPB (0 g Intravenous Stopped 10/15/19 0534)  metroNIDAZOLE (FLAGYL) IVPB 500 mg (0 mg Intravenous Stopped 10/15/19 0533)  iohexol (OMNIPAQUE) 300 MG/ML solution 100 mL (100 mLs Intravenous Contrast Given 10/15/19 0341)                                                                                                                                    Procedures .Critical Care Performed by: Fatima Blank, MD Authorized by: Fatima Blank, MD    CRITICAL CARE Performed by: Grayce Sessions Miking Usrey Total critical care time: 60 minutes Critical care time was exclusive of separately billable procedures and treating other patients. Critical care was necessary to treat or prevent imminent or life-threatening deterioration. Critical care was time spent personally by me on the following activities: development of treatment plan with patient and/or surrogate as well as nursing, discussions with consultants, evaluation of patient's response to treatment, examination of patient, obtaining history from patient or surrogate, ordering and performing treatments and interventions, ordering and review of laboratory studies, ordering and review of radiographic studies, pulse oximetry and re-evaluation of patient's condition.    (including critical care time)  Medical Decision Making / ED Course I have reviewed the nursing notes for this encounter and the patient's prior records (if available in EHR or on provided paperwork).   Lacey Cook was evaluated in Emergency Department on 10/15/2019 for the  symptoms described in the history of present illness. She was evaluated in the context of the global COVID-19 pandemic, which necessitated consideration that the patient might be at risk for infection with the SARS-CoV-2 virus that causes COVID-19. Institutional protocols and algorithms that pertain to the evaluation of patients at risk for COVID-19 are in a state of rapid change based on information released by regulatory bodies including the CDC and federal and state organizations. These policies and algorithms were followed during the patient's care in the ED.  Patient presents for deterioration at home and worsening of pressure ulcers. Patient is tachycardic but  afebrile normotensive. Deep sacral wound with surrounding erythema multiple pressure ulcers throughout. Patient is got leukocytosis and elevated lactic acid. Code sepsis was initiated patient started on empiric antibiotics.  Provided with IV fluids which cleared lactic acid Chest x-ray without evidence of pneumonia.  CT abdomen without intra-abdominal inflammatory/infectious process.  Sacral wound noted without evidence of osteomyelitis. Still awaiting UA.  Admitted to medicine for further work up and management       Final Clinical Impression(s) / ED Diagnoses Final diagnoses:  Sepsis without acute organ dysfunction, due to unspecified organism ([place])  Pressure ulcer of sacral region, stage 4 ([place])  Non-healing ulcer of multiple sites, limited to breakdown of skin [facility])      This chart was dictated using voice recognition software.  Despite best efforts to proofread,  errors can occur which can change the documentation meaning.   Fatima Blank, MD 10/15/19 [phone]

## 2019-10-15 NOTE — Progress Notes (Signed)

## 2019-10-15 NOTE — Consult Note (Addendum)
WOC Nurse Consult Note: Reason for Consult: pressure injuries Patient from SNF; contracted; dementia at baseline No family in the room; reported to have been taken home for care Wound type: 1. Stage 4 Pressure injury; sacrum: 4cm x 3cm x 4cm   2. Unstageable Pressure injury; left buttock: 3.0cm x 2.0cm x 0.2cm  3. Stage 2 Pressure injury; left ischium: 0.5cm x 0.5cm x 0.1cm  4. Partial thickness wound; right hip; 0.5cm x 0.5cm x 0.1cm  5. Partial thickness wound; right hip; 0.5cm x 0.5cm x 0.1cm  6. Resolving Deep Tissue Pressure injury; right lateral heel distal: 1.5cm x 1.5cm x 0cm  7. Resolving Deep Tissue Pressure Injury: right lateral foot; 2.5cm x 2.0cm x 0cm  8. Unstageable Pressure Injury: left plantar heel surface; 5cm x4cm x 0.1cm (at medial aspect) 9. Deep Tissue Pressure Injury: left medial knee; 6cm x 5cm x 0.1cm 10. Deep Tissue Pressure Injury: right medial knee; 5.5cm x 4.5cm x 0.1cm  11. Deep Tissue Pressure Injury: left medial pretibial area; 6cm x 3cm x 0cm  Pressure Injury POA: Yes x 11 Measurement:see above  Wound bed:  Stable eschar to the left heel 100% Resolving DTPIs x of the right lateral foot; 100% intact Sacrum:80% dark ruddy/20% yellow/grey slough Left buttock: 90% yellow slough/10% pink Left ischial; 100% pink with epithelial buds Right hip; both 100% pink Left and right medial knees; 100% dark purple with superficial skin loss Left medial pretibial: 95% pink non blanchable tissue/5% dark purple non blanchable   Drainage (amount, consistency, odor) minimal from the bilateral knee wounds; heavy drainage from the sacral wound with foul odor MRI neg.  Periwound: Redness noted on the left hip but blanches; sacral wound does not tunnel; otherwise intact Dressing procedure/placement/frequency: 1. Low air loss mattress in place in the ICU for pressure redistribution and moisture management 2. Urine collection device in place to manage incontinence 3. RD  consultation for wound healing supplementation 4. Foam dressings to the pressure injuries of the LEs including heels 5. Offload bilateral heels if possible with Prevalon boots (she is contracted) 6. Santyl to the sacral and left buttock wound daily 7. Hydrotherapy to the sacral and left buttock wound M-Thursday Top/pack both sites with saline moist gauze daily.  8. Foam to the left ischial wound 9. Inspect under foam dressings daily for evolution of DTPIs.   [facility] Nurse team will follow with you and see patient within 10 days for wound assessments.  Please notify [facility] nurses of any acute changes in the wounds or any new areas of concern [place] MSN, [place], [place], [place]

## 2019-10-15 NOTE — H&P (Addendum)
.  History and Physical    Lacey Cook XKG:[id number] DOB: 1956-04-24 DOA: 10/15/2019  PCP: Richarda Osmond, DO  Patient coming from: Home   Chief Complaint: Sacral ulcers  HPI: Lacey Cook is a 64 y.o. female with medical history significant of dementia. History is largely from her daughter at bedside. She reports that the patient was returned home from SNF in a deplorable state. She was immobile and "locked up" at her hips and knees. Since that time her engagement has been low. Her daughter states it has been a struggle to keep her cleaned and attended to. She states that the assistance she was promise never came; and thus, her mother has languished. The daughter became concerned after a sacral ulcer appeared and "popped". Her mother appeared to be getting weaker, mentation getting worse, and appetite getting worse. She became concerned and brought her to the ED.   ED Course: She was examined by ED stable. Multiple ulcerations noted. She was tachy, tachypneic with an elevated white count and lactic acidosis. She was started on sepsis protocol. TRH was called for admission.   Review of Systems: Unable to obtain d/y mentation.    Past Medical History:  Diagnosis Date  . Chronic right hip pain   . Diabetes mellitus without complication ([place])   . Family history of malignant hyperthermia   . Hypertension   . Renal disorder   . Seizures ([place])   . Stroke [facility])     Past Surgical History:  Procedure Laterality Date  . APPLICATION OF A-CELL OF EXTREMITY Bilateral 07/27/2019   Procedure: placement of Acell;  Surgeon: Wallace Going, DO;  Location: [facility];  Service: Plastics;  Laterality: Bilateral;  . I & D EXTREMITY Bilateral 07/27/2019   Procedure: Debridement of bilateral knee wounds;  Surgeon: Wallace Going, DO;  Location: [facility];  Service: Plastics;  Laterality: Bilateral;  1 hour, please     reports that she has never smoked. She has never used smokeless tobacco. She  reports that she does not drink alcohol or use drugs.  Allergies  Allergen Reactions  . Shellfish Allergy Anaphylaxis    Throat swells  . Sulfa Antibiotics Anaphylaxis    Throat Swelling    History reviewed. No pertinent family history.  Prior to Admission medications   Medication Sig Start Date End Date Taking? Authorizing Provider  acetaminophen (TYLENOL) 500 MG tablet Take 1,000 mg by mouth every 6 (six) hours as needed for moderate pain.   Yes [provider]  albuterol (VENTOLIN HFA) 108 (90 Base) MCG/ACT inhaler Inhale 2 puffs into the lungs every 6 (six) hours as needed for wheezing or shortness of breath. 11/24/18  Yes Anderson, Chelsey L, DO  ascorbic acid (VITAMIN C) 500 MG tablet Take 500 mg by mouth daily.   Yes [provider]  Multiple Vitamins-Minerals (MULTIVITAMIN WITH MINERALS) tablet Take 1 tablet by mouth daily.   Yes [provider]  venlafaxine (EFFEXOR) 75 MG tablet Take 1 tablet (75 mg total) by mouth 2 (two) times daily. 12/14/18  Yes Anderson, Chelsey L, DO  Amino Acids-Protein Hydrolys (FEEDING SUPPLEMENT, PRO-STAT SUGAR FREE 64,) LIQD Take 30 mLs by mouth daily.    [provider]  HYDROcodone-acetaminophen (NORCO/VICODIN) 5-325 MG tablet Take 1-2 tablets by mouth every 4 (four) hours as needed for moderate pain. 07/29/19   Caren Griffins, MD    Physical Exam: Vitals:   10/15/19 0330 10/15/19 0414 10/15/19 0501 10/15/19 0700  BP:  (!) 140/111 Marland Kitchen)  141/83 (!) 154/86  Pulse: (!) 121 (!) 122 (!) 138 (!) 125  Resp: (!) 21 17 (!) 24 20  Temp:      TempSrc:      SpO2: 98% 96% 96% 95%  Weight:      Height:        Constitutional: 64 y.o. female NAD, calm, comfortable Vitals:   10/15/19 0330 10/15/19 0414 10/15/19 0501 10/15/19 0700  BP:  (!) 140/111 (!) 141/83 (!) 154/86  Pulse: (!) 121 (!) 122 (!) 138 (!) 125  Resp: (!) 21 17 (!) 24 20  Temp:      TempSrc:      SpO2: 98% 96% 96% 95%  Weight:      Height:        General: 64 y.o. female resting in bed in NAD Eyes: PERRL, normal sclera ENMT: Nares patent w/o discharge, ears w/o discharge/lesions/ulcers Cardiovascular: tachy, +S1, S2, no m/g/r, equal pulses throughout Respiratory: CTABL, no w/r/r, normal WOB GI: BS+, NDNT, no masses noted, no organomegaly noted MSK: contracture noted w/ limited ROM at both hips and knees Skin: sacral ulceration/left superior buttock noted; ulcerations of just inferior to medial knees, right hip wound Neuro: she does not follow commands, she only says "I don't know"; family reports Alzheimer's dementia  Labs on Admission: I have personally reviewed following labs and imaging studies  CBC: Recent Labs  Lab 10/15/19 0144 10/15/19 0257  WBC 12.0*  --   NEUTROABS 10.0*  --   HGB 15.6* 18.7*  HCT 48.9* 55.0*  MCV 96.6  --   PLT 302  --    Basic Metabolic Panel: Recent Labs  Lab 10/15/19 0144 10/15/19 0257  NA 143 142  K 3.5 3.5  CL 102 103  CO2 28  --   GLUCOSE 141* 132*  BUN 18 17  CREATININE 0.50 0.40*  CALCIUM 8.8*  --    GFR: Estimated Creatinine Clearance: 73.9 mL/min (A) (by C-G formula based on SCr of 0.4 mg/dL (L)). Liver Function Tests: Recent Labs  Lab 10/15/19 0144  AST 30  ALT 21  ALKPHOS 97  BILITOT 1.6*  PROT 7.3  ALBUMIN 3.0*   No results for input(s): LIPASE, AMYLASE in the last 168 hours. No results for input(s): AMMONIA in the last 168 hours. Coagulation Profile: Recent Labs  Lab 10/15/19 0144  INR 1.2   Cardiac Enzymes: No results for input(s): CKTOTAL, CKMB, CKMBINDEX, TROPONINI in the last 168 hours. BNP (last 3 results) No results for input(s): PROBNP in the last 8760 hours. HbA1C: No results for input(s): HGBA1C in the last 72 hours. CBG: No results for input(s): GLUCAP in the last 168 hours. Lipid Profile: No results for input(s): CHOL, HDL, LDLCALC, TRIG, CHOLHDL, LDLDIRECT in the last 72 hours. Thyroid Function Tests: No results for input(s): TSH,  T4TOTAL, FREET4, T3FREE, THYROIDAB in the last 72 hours. Anemia Panel: No results for input(s): VITAMINB12, FOLATE, FERRITIN, TIBC, IRON, RETICCTPCT in the last 72 hours. Urine analysis:    Component Value Date/Time   COLORURINE YELLOW 10/15/2019 0616   APPEARANCEUR CLEAR 10/15/2019 0616   LABSPEC >1.046 (H) 10/15/2019 0616   PHURINE 8.0 10/15/2019 0616   GLUCOSEU NEGATIVE 10/15/2019 0616   HGBUR NEGATIVE 10/15/2019 0616   BILIRUBINUR NEGATIVE 10/15/2019 0616   KETONESUR 5 (A) 10/15/2019 0616   PROTEINUR NEGATIVE 10/15/2019 0616   NITRITE POSITIVE (A) 10/15/2019 0616   LEUKOCYTESUR SMALL (A) 10/15/2019 0616    Radiological Exams on Admission: CT ABDOMEN PELVIS W CONTRAST  Result Date: 10/15/2019 CLINICAL DATA:  Assess extent of sacral wound EXAM: CT ABDOMEN AND PELVIS WITH CONTRAST TECHNIQUE: Multidetector CT imaging of the abdomen and pelvis was performed using the standard protocol following bolus administration of intravenous contrast. CONTRAST:  122mL OMNIPAQUE IOHEXOL 300 MG/ML  SOLN COMPARISON:  None. FINDINGS: Lower chest:  No contributory findings. Hepatobiliary: No focal liver abnormality.No evidence of biliary obstruction or stone. Pancreas: Unremarkable pancreas. Small duodenal diverticulum at the pancreatic head. Spleen: Unremarkable. Adrenals/Urinary Tract: Negative adrenals. No hydronephrosis or stone. Unremarkable bladder. Stomach/Bowel:  No obstruction. No appendicitis. Vascular/Lymphatic: No acute vascular abnormality. No mass or adenopathy. Reproductive:Unremarkable for age Other: No ascites or pneumoperitoneum. Musculoskeletal: Sacral decubitus ulcer is seen over the lower sacrum with tracking leftward in the subcutaneous fat by 2 cm. No collection or bony erosion seen. Severe right hip osteoarthritis. Diffuse lumbar and lower thoracic degenerative disc narrowing with lumbar facet arthritis and L4-5, L5-S1 anterolisthesis. IMPRESSION: 1. Lower sacral decubitus ulcer with  leftward subcutaneous tracking by 2 cm. No collection or visible osteomyelitis. 2. Advanced right hip and lumbar spine degeneration. Electronically Signed   By: Monte Fantasia M.D.   On: 10/15/2019 04:49   DG Chest Port 1 View  Result Date: 10/15/2019 CLINICAL DATA:  Tachycardia EXAM: PORTABLE CHEST 1 VIEW COMPARISON:  07/20/2018 FINDINGS: The patient's chin obscures the apices. Hazy left lung opacity possibly due to rotation and soft tissue. No consolidation, pleural effusion or pneumothorax. IMPRESSION: Mild asymmetric hazy left thoracic opacity, suspected to be secondary to rotation and overlying soft tissues. No consolidative airspace disease. Electronically Signed   By: Donavan Foil M.D.   On: 10/15/2019 02:39    EKG: Independently reviewed. Sinus Tach  Assessment/Plan Active Problems:   Asthma   Alzheimer's dementia (HCC)   Infected pressure ulcer   Sepsis (HCC)   Lactic acidosis   Inadequate oral intake   Hip pain, bilateral   Bilateral knee pain   Sepsis secondary to pressure ulcers Infected pressure ulcer of sacrum and bilateral medial knees Lactic acidosis     - admit to inpt tele     - sepsis protocol: abx w/ vanc/cefepime/flagyl for now w/ pharm dosing consult     - continue IVF     - WOCN for ulcers     - TOC consult  Asthma, type unknown     - continue PRN albuterol  Poor living situation Poor PO intake     - nutrition consult     - TOC consult     - PT/OT consult  Alzhiemer's dementia Depression     - continue effexor  Hip pain/contracture Knee pain/contracture Limited mobility     - CT ab/pelvis shows: Severe right hip osteoarthritis. Diffuse lumbar and lower thoracic degenerative disc narrowing with lumbar facet arthritis and L4-5, L5-S1 anterolisthesis.     - XR knee once more stable     - PT/OT consult  DVT prophylaxis: lovenox  Code Status: FULL, confirmed w/ dtr at bedside  Family Communication: With dtr at bedside   Consults called: None    Admission status: Inpatient d/t sepsis and need for ongoing IV abx and w/u.  Status is: Inpatient  Remains inpatient appropriate because:IV treatments appropriate due to intensity of illness or inability to take PO   Dispo: The patient is from: Home              Anticipated d/c is to: SNF  Anticipated d/c date is: > 3 days              Patient currently is not medically stable to d/c.   Jonnie Finner DO [facility]  If 7PM-7AM, please contact night-coverage [email or web address]  10/15/2019, 8:00 AM

## 2019-10-15 NOTE — Progress Notes (Signed)
PT - HYDROTHERAPY EVAL AND TREATMENT NOTE   10/15/19 1700  Subjective Assessment  Subjective pt spontaneously reaching up with UEs as if trying to get something  Patient and Family Stated Goals pt unable, no family present   Date of Onset  (present on admission)  Evaluation and Treatment  Evaluation and Treatment Procedures Explained to Patient/Family Yes  Evaluation and Treatment Procedures Patient unable to consent due to mental status  Pressure Injury 10/15/19 Sacrum Mid Unstageable - Full thickness tissue loss in which the base of the injury is covered by slough (yellow, tan, gray, green or brown) and/or eschar (tan, brown or black) in the wound bed. *PT* sacral pressure injury, bone   Date First Assessed/Time First Assessed: 10/15/19 1510   Location: Sacrum  Location Orientation: Mid  Staging: Unstageable - Full thickness tissue loss in which the base of the injury is covered by slough (yellow, tan, gray, green or brown) and/or esc...  Dressing Type Foam - Lift dressing to assess site every shift;Moist to dry;Other (Comment) (santyl)  Dressing Changed  State of Healing Eschar  Site / Wound Assessment Painful;Red  % Wound base Red or Granulating 0%  % Wound base Yellow/Fibrinous Exudate 10%  % Wound base Black/Eschar 90%  Peri-wound Assessment Excoriated;Pink;Erythema (non-blanchable)  Wound Length (cm) 3 cm  Wound Width (cm) 3.5 cm  Wound Depth (cm) 1.5 cm  Wound Surface Area (cm^2) 10.5 cm^2  Wound Volume (cm^3) 15.75 cm^3  Undermining (cm)  (beginning undermining wound circumference )  Margins Unattached edges (unapproximated)  Drainage Amount Copious  Drainage Description Purulent;Serosanguineous;Odor  Treatment Debridement (Selective);Hydrotherapy (Pulse lavage);Packing (Saline gauze);Other (Comment) (to have santyl, unavailable today but ordered)  Hydrotherapy  Pulsed lavage therapy - wound location sacrum  Pulsed Lavage with Suction (psi) 8 psi (to 12)  Pulsed Lavage  with Suction - Normal Saline Used 1000 mL  Pulsed Lavage Tip Tip with splash shield  Selective Debridement  Selective Debridement - Location sacrum  Selective Debridement - Tools Used Forceps;Scissors  Selective Debridement - Tissue Removed black/grey necrotic tissue  Wound Therapy - Assess/Plan/Recommendations  Wound Therapy - Clinical Statement pt will benefit from hydrotherapy to facilitate wound healing and decr bioburden  Wound Therapy - Functional Problem List grossly bed bound/immobile  Factors Delaying/Impairing Wound Healing Immobility;Multiple medical problems;Infection - systemic/local (decr cognition)  Hydrotherapy Plan Debridement;Dressing change;Patient/family education;Pulsatile lavage with suction  Wound Therapy - Frequency Other (comment) (4x per wk per WOC RN M-Th)  Wound Therapy - Follow Up Recommendations Skilled nursing facility  Wound Plan continue hydrotherapy per orders M-Th for cleansing/debridement  Wound Therapy Goals - Improve the function of patient's integumentary system by progressing the wound(s) through the phases of wound healing by:  Decrease Necrotic Tissue to 70  Decrease Necrotic Tissue - Progress Goal set today  Increase Granulation Tissue to 30  Increase Granulation Tissue - Progress Goal set today  Improve Drainage Characteristics Mod  Improve Drainage Characteristics - Progress Goal set today  Goals/treatment plan/discharge plan were made with and agreed upon by patient/family No, Patient unable to participate in goals/treatment/discharge plan and family unavailable  Time For Goal Achievement 2 weeks  Wound Therapy - Potential for Goals Poor  Baxter Flattery, PT Acute Rehab Dept [facility]): [phone]   10/15/2019

## 2019-10-16 DIAGNOSIS — J4520 Mild intermittent asthma, uncomplicated: Secondary | ICD-10-CM

## 2019-10-16 LAB — GLUCOSE, CAPILLARY
Glucose-Capillary: 136 mg/dL — ABNORMAL HIGH (ref 70–99)
Glucose-Capillary: 133 mg/dL — ABNORMAL HIGH (ref 70–99)

## 2019-10-16 LAB — COMPREHENSIVE METABOLIC PANEL
ALT: 18 U/L (ref 0–44)
AST: 21 U/L (ref 15–41)
Albumin: 2 g/dL — ABNORMAL LOW (ref 3.5–5.0)
Alkaline Phosphatase: 64 U/L (ref 38–126)
Anion gap: 4 — ABNORMAL LOW (ref 5–15)
BUN: 16 mg/dL (ref 8–23)
CO2: 22 mmol/L (ref 22–32)
Calcium: 7.9 mg/dL — ABNORMAL LOW (ref 8.9–10.3)
Chloride: 118 mmol/L — ABNORMAL HIGH (ref 98–111)
Creatinine, Ser: <0.3 mg/dL — ABNORMAL LOW (ref 0.44–1.00)
Glucose, Bld: 99 mg/dL (ref 70–99)
Potassium: 2.5 mmol/L — CL (ref 3.5–5.1)
Sodium: 144 mmol/L (ref 135–145)
Total Bilirubin: 0.6 mg/dL (ref 0.3–1.2)
Total Protein: 5 g/dL — ABNORMAL LOW (ref 6.5–8.1)

## 2019-10-16 LAB — CBC
HCT: 37.8 % (ref 36.0–46.0)
Hemoglobin: 11.9 g/dL — ABNORMAL LOW (ref 12.0–15.0)
MCH: 30.7 pg (ref 26.0–34.0)
MCHC: 31.5 g/dL (ref 30.0–36.0)
MCV: 97.4 fL (ref 80.0–100.0)
Platelets: 221 10*3/uL (ref 150–400)
RBC: 3.88 MIL/uL (ref 3.87–5.11)
RDW: 14.3 % (ref 11.5–15.5)
WBC: 8.5 10*3/uL (ref 4.0–10.5)
nRBC: 0 % (ref 0.0–0.2)

## 2019-10-16 LAB — PROTIME-INR
INR: 1.3 — ABNORMAL HIGH (ref 0.8–1.2)
Prothrombin Time: 16.1 seconds — ABNORMAL HIGH (ref 11.4–15.2)

## 2019-10-16 LAB — URINE CULTURE: Culture: NO GROWTH

## 2019-10-16 LAB — POTASSIUM: Potassium: 2.9 mmol/L — ABNORMAL LOW (ref 3.5–5.1)

## 2019-10-16 LAB — PROCALCITONIN: Procalcitonin: <0.1 ng/mL

## 2019-10-16 LAB — CORTISOL-AM, BLOOD: Cortisol - AM: 18.6 ug/dL (ref 6.7–22.6)

## 2019-10-16 LAB — MAGNESIUM: Magnesium: 1.9 mg/dL (ref 1.7–2.4)

## 2019-10-16 MED ORDER — POTASSIUM CHLORIDE CRYS ER 20 MEQ PO TBCR
40.0000 meq | EXTENDED_RELEASE_TABLET | Freq: Once | ORAL | Status: DC
  Filled 2019-10-16: qty 2

## 2019-10-16 MED ORDER — POTASSIUM CHLORIDE CRYS ER 20 MEQ PO TBCR
40.0000 meq | EXTENDED_RELEASE_TABLET | ORAL | Status: AC
  Administered 2019-10-16 (×2): 40 meq via ORAL
  Filled 2019-10-16 (×2): qty 2

## 2019-10-16 NOTE — Progress Notes (Signed)
CRITICAL VALUE ALERT  Critical Value: Potassium= 2.5   Date & Time Notied: 10/16/19; 5:38  Provider Notified: [facility]/ B.Randol Kern  Orders Received/Actions taken: MD ordered for Klor-Con 12mEq PO and given to patient

## 2019-10-16 NOTE — Evaluation (Signed)
Occupational Therapy Evaluation Patient Details Name: Lacey Cook MRN: [id number] DOB: [birth year] Today's Date: 10/16/2019    History of Present Illness Lacey Cook is a 64 y.o. female with a past medical history listed below including Alzheimer's dementia who presents to the emergency department from home for worsening pressure ulcers.  Patient is accompanied by the daughter who reports worsening wounds despite their best efforts to manage.  The patient's daughter has dwarfism and has difficulty maneuvering the patient.  Daughter reports that the patient frequently picks at her wounds.  She also reports that she plays with her feces and at times eats it as well.  Over the last several days the patient has had decreased oral intake though she has been drinking Ensure. The patient was sent home from a skilled nursing facility/rehab 1 month ago.   Clinical Impression   Ms. Lacey Cook is a 64 year old woman with advanced dementia admitting to the hospital with worsening pressure ulcers and sepsis. On evaluation patient presents impaired cognition secondary to dementia - with limited ability to converse and follow directions. Patient's bilateral lower extremities contracted and patient resistant and crying out with pain with any attempts at movement or touching. Patient demonstrates functional use of right upper extremity demonstrating ability to grossly brush her teeth, hold a cup and feed herself with set up, visual cue and verbal cues. Patient demonstrates grossly functional movement of left upper extremity - but neglects it and only actually moves it when therapist initiates the movement and cues her to left arm. Patient is bed bound from contractures and requires total care for dressing, bathing, toileting. Patient able to assist with grooming and feeding depending on level of participation which fluctuates due to dementia. Patient appears to be at baseline in regards to abilities and without rehab  potential. Recommend long term care or 24/7 assistance at home.    Follow Up Recommendations  (long term care or 24/7 assistance at home.)    Equipment Recommendations  None recommended by OT    Recommendations for Other Services       Precautions / Restrictions Precautions Precautions: Fall Restrictions Weight Bearing Restrictions: No      Mobility Bed Mobility               General bed mobility comments: Total assist for bed mobility. Patient resistant to movement secondary to pain.  Transfers                      Balance                                           ADL either performed or assessed with clinical judgement   ADL Overall ADL's : Needs assistance/impaired Eating/Feeding: Set up Eating/Feeding Details (indicate cue type and reason): Patient able to hold cup and sip from straw and spoon food into mouth with right hand. Grooming: Set up Grooming Details (indicate cue type and reason): Therapist provided set up to perform washing face - but patient wouldn't perform. Has the physical ability to perform but limited by dementia. Upper Body Bathing: Maximal assistance;Bed level   Lower Body Bathing: Total assistance;Bed level   Upper Body Dressing : Maximal assistance;Bed level   Lower Body Dressing: Total assistance;Bed level     Toilet Transfer Details (indicate cue type and reason): unable Toileting- Clothing Manipulation and Hygiene: Bed level;Total  assistance     Tub/Shower Transfer Details (indicate cue type and reason): unable Functional mobility during ADLs: Total assistance       Vision   Vision Assessment?: No apparent visual deficits     Perception     Praxis      Pertinent Vitals/Pain Pain Assessment: Faces Faces Pain Scale: Hurts whole lot Pain Location: with any touching of lower extremities, left arm. Pain Descriptors / Indicators: Grimacing;Other (Comment)(cries out when touched.) Pain  Intervention(s): Monitored during session     Hand Dominance     Extremity/Trunk Assessment Upper Extremity Assessment Upper Extremity Assessment: RUE deficits/detail;LUE deficits/detail RUE Deficits / Details: Functional ROM of right upper extremity. Unable to strength test. Patient able to use right arm/hand functionally. LUE Deficits / Details: Demonstrates functional ROM of LUE but has to be cued to use. LUE: Unable to fully assess due to pain   Lower Extremity Assessment Lower Extremity Assessment: Defer to PT evaluation RLE Deficits / Details: bil LE flexion contractures. ankles at neutral. pt is resistant to imposed movement       Communication     Cognition Arousal/Alertness: Awake/alert Behavior During Therapy: Flat affect Overall Cognitive Status: History of cognitive impairments - at baseline                                     General Comments       Exercises     Shoulder Instructions      Home Living                                   Additional Comments: Unable to obtain PLOF information from patient. Family not present. Patient had been at [facility] and recently discharged home with daughter for approx 1 month.      Prior Functioning/Environment Level of Independence: Needs assistance  Gait / Transfers Assistance Needed: Unable to ambulate. Lower extremities contracted. ADL's / Homemaking Assistance Needed: Predominantly total assist for ADLs.            OT Problem List:        OT Treatment/Interventions:      OT Goals(Current goals can be found in the care plan section) Acute Rehab OT Goals OT Goal Formulation: Patient unable to participate in goal setting  OT Frequency:     Barriers to D/C:            Co-evaluation              AM-PAC OT "6 Clicks" Daily Activity     Outcome Measure Help from another person eating meals?: A Little Help from another person taking care of personal grooming?: A Little Help  from another person toileting, which includes using toliet, bedpan, or urinal?: Total Help from another person bathing (including washing, rinsing, drying)?: Total Help from another person to put on and taking off regular upper body clothing?: Total Help from another person to put on and taking off regular lower body clothing?: Total 6 Click Score: 10   End of Session    Activity Tolerance: Other (comment)(limited by dementia and pain.) Patient left:    OT Visit Diagnosis: Muscle weakness (generalized) (M62.81);Pain                Time: 1250-1302 OT Time Calculation (min): 12 min Charges:  OT Evaluation $OT Eval Low Complexity: 1  Low  Derl Barrow, OTR/L Acute Care Rehab Services  Office [phone]   Lenward Chancellor 10/16/2019, 1:53 PM

## 2019-10-16 NOTE — Progress Notes (Signed)
[facility]                                                                              Patient Demographics  Lacey Cook, is a 64 y.o. female, DOB - [birth year], WCB:[id number]  Admit date - 10/15/2019   Admitting Physician Vianne Bulls, MD  Outpatient Primary MD for the patient is Richarda Osmond, DO  Outpatient specialists:   LOS - 1  days   Medical records reviewed and are as summarized below:    Chief Complaint  Patient presents with  . Skin Ulcer       Brief summary   Patient is a 64 year old female with dementia, hypertension, history of stroke, seizures, diabetes mellitus, chronic right hip pain, returned home from SNF.  History obtained from the daughter who reported that she was immobile and locked up at her knees and hips, it had been a struggle to keep her cleaned and attended to.  Patient's daughter became concerned after the sacral ulcer.  She felt that the patient was getting more weaker, worse mentation, appetite getting worse, hence brought to ED.. In ED, patient was noted to have multiple ulcerations, sepsis possibly from infected pressure ulcers.  ED Course: She was examined by ED stable. Multiple ulcerations noted. She was tachy, tachypneic with an elevated white count and lactic acidosis. She was started on sepsis protocol. [facility] was called for admission.    Assessment & Plan    Sepsis secondary to multiple pressure ulcers as listed below, infected pressure ulcer of sacrum and bilateral medial knees -Patient met sepsis criteria at the time of admission, with the lactic acidosis, tachycardia, tachypnea, leukocytosis -Continue IV fluids, broad-spectrum antibiotics with vancomycin cefepime and Flagyl -Wound care consulted, likely will need hydrotherapy -Appreciate recommendations, dietitian consult, low air loss mattress urine collection device, Santyl and hydrotherapy Monday to Thursday, Prevalon boots -Social work  consulted  Asthma -Currently stable, no wheezing, continue as needed albuterol  Dehydration, poor p.o. intake, moderate protein calorie malnutrition -will add prostat and zinc for wound healing, nutritional supplements -Continue IV fluid, encourage diet -PT OT consult  Alzheimer's dementia, depression -Continue Effexor, given poor functional status, requested palliative consult for goals of care and support for the daughter, currently full code  Chronic hip pain, contractures, knee pain, contractures, severe debility -CT abdomen pelvis showed severe right hip osteoarthritis, diffuse lumbar and lower thoracic degenerative disc narrowing with lumbar facet arthritis, L4-L5 L5-S1  Anterolisthesis. -PT OT consult, x-rays of the knees once more stable  Pressure injury documentation below: All present on admission  Pressure Injury 07/21/19 Knee Left (Active)  07/21/19   Location: Knee  Location Orientation: Left  Staging:   Wound Description (Comments):   Present on Admission: Yes     Pressure Injury 07/21/19 Knee Right Unstageable - Full thickness tissue loss in which the base of the injury is covered by slough (yellow, tan, gray, green or brown) and/or eschar (tan, brown or black) in the wound bed. Dry stable eschar (Active)  07/21/19   Location: Knee  Location Orientation: Right  Staging: Unstageable - Full thickness tissue loss in which the base of the  injury is covered by slough (yellow, tan, gray, green or brown) and/or eschar (tan, brown or black) in the wound bed.  Wound Description (Comments): Dry stable eschar  Present on Admission: Yes     Pressure Injury 07/21/19 Heel Right serum filled blister (Active)  07/21/19   Location: Heel  Location Orientation: Right  Staging:   Wound Description (Comments): serum filled blister  Present on Admission: Yes     Pressure Injury 07/26/19 Buttocks Posterior;Mid Stage 1 -  Intact skin with non-blanchable redness of a localized area  usually over a bony prominence. (Active)  07/26/19 2301  Location: Buttocks  Location Orientation: Posterior;Mid  Staging: Stage 1 -  Intact skin with non-blanchable redness of a localized area usually over a bony prominence.  Wound Description (Comments):   Present on Admission:      Pressure Injury 10/15/19 Heel Left;Lateral Unstageable - Full thickness tissue loss in which the base of the injury is covered by slough (yellow, tan, gray, green or brown) and/or eschar (tan, brown or black) in the wound bed. (Active)  10/15/19 1234  Location: Heel  Location Orientation: Left;Lateral  Staging: Unstageable - Full thickness tissue loss in which the base of the injury is covered by slough (yellow, tan, gray, green or brown) and/or eschar (tan, brown or black) in the wound bed.  Wound Description (Comments):   Present on Admission: Yes     Pressure Injury 10/15/19 Heel Right;Lateral;Distal Deep Tissue Pressure Injury - Purple or maroon localized area of discolored intact skin or blood-filled blister due to damage of underlying soft tissue from pressure and/or shear. resolving (Active)  10/15/19 1235  Location: Heel  Location Orientation: Right;Lateral;Distal  Staging: Deep Tissue Pressure Injury - Purple or maroon localized area of discolored intact skin or blood-filled blister due to damage of underlying soft tissue from pressure and/or shear.  Wound Description (Comments): resolving  Present on Admission: Yes     Pressure Injury 10/15/19 Heel Posterior;Right;Lateral Deep Tissue Pressure Injury - Purple or maroon localized area of discolored intact skin or blood-filled blister due to damage of underlying soft tissue from pressure and/or shear. resolving (Active)  10/15/19 1235  Location: Heel  Location Orientation: Posterior;Right;Lateral  Staging: Deep Tissue Pressure Injury - Purple or maroon localized area of discolored intact skin or blood-filled blister due to damage of underlying soft  tissue from pressure and/or shear.  Wound Description (Comments): resolving  Present on Admission: Yes     Pressure Injury 10/15/19 Knee Left;Medial Deep Tissue Pressure Injury - Purple or maroon localized area of discolored intact skin or blood-filled blister due to damage of underlying soft tissue from pressure and/or shear. (Active)  10/15/19 1236  Location: Knee  Location Orientation: Left;Medial  Staging: Deep Tissue Pressure Injury - Purple or maroon localized area of discolored intact skin or blood-filled blister due to damage of underlying soft tissue from pressure and/or shear.  Wound Description (Comments):   Present on Admission: Yes     Pressure Injury 10/15/19 Knee Right;Medial Deep Tissue Pressure Injury - Purple or maroon localized area of discolored intact skin or blood-filled blister due to damage of underlying soft tissue from pressure and/or shear. (Active)  10/15/19 1236  Location: Knee  Location Orientation: Right;Medial  Staging: Deep Tissue Pressure Injury - Purple or maroon localized area of discolored intact skin or blood-filled blister due to damage of underlying soft tissue from pressure and/or shear.  Wound Description (Comments):   Present on Admission: Yes     Pressure Injury 10/15/19  Pretibial Left;Distal;Medial Deep Tissue Pressure Injury - Purple or maroon localized area of discolored intact skin or blood-filled blister due to damage of underlying soft tissue from pressure and/or shear. (Active)  10/15/19 1237  Location: Pretibial  Location Orientation: Left;Distal;Medial  Staging: Deep Tissue Pressure Injury - Purple or maroon localized area of discolored intact skin or blood-filled blister due to damage of underlying soft tissue from pressure and/or shear.  Wound Description (Comments):   Present on Admission: Yes     Pressure Injury 10/15/19 Sacrum Medial Stage 4 - Full thickness tissue loss with exposed bone, tendon or muscle. (Active)  10/15/19 1237   Location: Sacrum  Location Orientation: Medial  Staging: Stage 4 - Full thickness tissue loss with exposed bone, tendon or muscle.  Wound Description (Comments):   Present on Admission: Yes     Pressure Injury 10/15/19 Buttocks Left;Upper Unstageable - Full thickness tissue loss in which the base of the injury is covered by slough (yellow, tan, gray, green or brown) and/or eschar (tan, brown or black) in the wound bed. (Active)  10/15/19 1238  Location: Buttocks  Location Orientation: Left;Upper  Staging: Unstageable - Full thickness tissue loss in which the base of the injury is covered by slough (yellow, tan, gray, green or brown) and/or eschar (tan, brown or black) in the wound bed.  Wound Description (Comments):   Present on Admission: Yes     Pressure Injury 10/15/19 Ischial tuberosity Left;Distal Stage 2 -  Partial thickness loss of dermis presenting as a shallow open injury with a red, pink wound bed without slough. (Active)  10/15/19 1238  Location: Ischial tuberosity  Location Orientation: Left;Distal  Staging: Stage 2 -  Partial thickness loss of dermis presenting as a shallow open injury with a red, pink wound bed without slough.  Wound Description (Comments):   Present on Admission: Yes     Pressure Injury 10/15/19 Sacrum Mid Unstageable - Full thickness tissue loss in which the base of the injury is covered by slough (yellow, tan, gray, green or brown) and/or eschar (tan, brown or black) in the wound bed. *PT* sacral pressure injury, bone  (Active)  10/15/19 1510  Location: Sacrum  Location Orientation: Mid  Staging: Unstageable - Full thickness tissue loss in which the base of the injury is covered by slough (yellow, tan, gray, green or brown) and/or eschar (tan, brown or black) in the wound bed.  Wound Description (Comments): *PT* sacral pressure injury, bone palpable, not able to visualize base  Present on Admission: Yes    Obesity with protein calorie  malnutrition Estimated body mass index is 36.94 kg/m as calculated from the following:   Height as of this encounter: _0  (1.549 m).   Weight as of this encounter: 88.7 kg.  Code Status: Full CODE STATUS DVT Prophylaxis:  Lovenox  Family Communication: Discussed all imaging results, lab results, explained to the patient's daughter on the phone   Disposition Plan:     Status is: Inpatient  Remains inpatient appropriate because:Inpatient level of care appropriate due to severity of illness, dementia, multiple pressure sores, infected, sepsis, on IV antibiotics   Dispo: The patient is from: Home              Anticipated d/c is to: SNF              Anticipated d/c date is: > 3 days              Patient currently is not medically stable to  d/c.       Time Spent in minutes 35 minutes Procedures:  None  Consultants:   Wound care  Antimicrobials:   Anti-infectives (From admission, onward)   Start     Dose/Rate Route Frequency Ordered Stop   10/16/19 0400  vancomycin (VANCOCIN) IVPB 1000 mg/200 mL premix     1,000 mg 200 mL/hr over 60 Minutes Intravenous Every 24 hours 10/15/19 1049     10/15/19 1200  metroNIDAZOLE (FLAGYL) IVPB 500 mg     500 mg 100 mL/hr over 60 Minutes Intravenous Every 8 hours 10/15/19 1119     10/15/19 1200  ceFEPIme (MAXIPIME) 2 g in sodium chloride 0.9 % 100 mL IVPB     2 g 200 mL/hr over 30 Minutes Intravenous Every 8 hours 10/15/19 1037     10/15/19 0315  ceFEPIme (MAXIPIME) 2 g in sodium chloride 0.9 % 100 mL IVPB     2 g 200 mL/hr over 30 Minutes Intravenous  Once 10/15/19 0305 10/15/19 0534   10/15/19 0315  metroNIDAZOLE (FLAGYL) IVPB 500 mg     500 mg 100 mL/hr over 60 Minutes Intravenous  Once 10/15/19 0305 10/15/19 0533   10/15/19 0315  vancomycin (VANCOCIN) IVPB 1000 mg/200 mL premix  Status:  Discontinued     1,000 mg 200 mL/hr over 60 Minutes Intravenous  Once 10/15/19 0305 10/15/19 0309   10/15/19 0315  vancomycin (VANCOREADY) IVPB  1500 mg/300 mL     1,500 mg 150 mL/hr over 120 Minutes Intravenous  Once 10/15/19 0310 10/15/19 0715          Medications  Scheduled Meds: . ascorbic acid  500 mg Oral BID  . Chlorhexidine Gluconate Cloth  6 each Topical Daily  . collagenase   Topical Daily  . enoxaparin (LOVENOX) injection  40 mg Subcutaneous Q24H  . feeding supplement (ENSURE ENLIVE)  237 mL Oral TID BM  . feeding supplement (PRO-STAT SUGAR FREE 64)  30 mL Oral TID BM  . multivitamins with iron  1 tablet Oral Daily  . sodium chloride flush  3 mL Intravenous Q12H  . venlafaxine  75 mg Oral BID  . zinc sulfate  220 mg Oral Daily   Continuous Infusions: . sodium chloride 100 mL/hr at 10/16/19 0800  . sodium chloride 10 mL/hr at 10/16/19 0800  . ceFEPime (MAXIPIME) IV Stopped (10/16/19 0514)  . metronidazole Stopped (10/16/19 0017)  . vancomycin Stopped (10/16/19 0543)   PRN Meds:.sodium chloride, acetaminophen **OR** acetaminophen, albuterol, HYDROcodone-acetaminophen, morphine injection      Subjective:   Lacey Cook was seen and examined today.  Dementia, unable to obtain a review of system from the patient.  Overnight no acute issues.  No acute fevers or chills, pain.  No chest pain or shortness of breath or diarrhea.  Confused  Objective:   Vitals:   10/16/19 0400 10/16/19 0600 10/16/19 0800 10/16/19 0819  BP: 100/61 120/60 (!) 138/94   Pulse: (!) 102 98 (!) 109   Resp: (!) _0 Temp: 98.5 F (36.9 C)   97.8 F (36.6 C)  TempSrc: Axillary   Axillary  SpO2: 100% 100% 100%   Weight:      Height:        Intake/Output Summary (Last 24 hours) at 10/16/2019 1034 Last data filed at 10/16/2019 0800 Gross per 24 hour  Intake 3495.77 ml  Output 100 ml  Net 3395.77 ml     Wt Readings from Last 3 Encounters:  10/15/19 88.7 kg  07/27/19 84 kg  11/24/18 88.3 kg     Exam  General: Alert, confused, dementia  Cardiovascular: S1 S2 auscultated, no murmurs, RRR  Respiratory: Clear  to auscultation bilaterally, no wheezing, rales or rhonchi  Gastrointestinal: Soft, nontender, nondistended, + bowel sounds  Ext: no pedal edema bilaterally  Neuro: Contracted, lying on 1 side, does not follow commands  Musculoskeletal: No digital cyanosis, clubbing  Skin: Multiple pressure sores, as above  Psych: Alert and pleasant, confused   Data Reviewed:  I have personally reviewed following labs and imaging studies  Micro Results Recent Results (from the past 240 hour(s))  Blood Culture (routine x 2)     Status: None (Preliminary result)   Collection Time: 10/15/19  1:44 AM   Specimen: BLOOD  Result Value Ref Range Status   Specimen Description   Final    BLOOD RIGHT ANTECUBITAL Performed at [facility], [address], [place], [place] [address]    Special Requests   Final    BOTTLES DRAWN AEROBIC AND ANAEROBIC Blood Culture adequate volume Performed at [facility] [address], [place], [place] [address]    Culture   Final    NO GROWTH 1 DAY Performed at [facility], [place] [address], [place], [place] [address]    Report Status PENDING  Incomplete  Blood Culture (routine x 2)     Status: None (Preliminary result)   Collection Time: 10/15/19  1:49 AM   Specimen: BLOOD  Result Value Ref Range Status   Specimen Description   Final    BLOOD BLOOD LEFT HAND Performed at [facility] [address], [place], [place] [address]    Special Requests   Final    BOTTLES DRAWN AEROBIC ONLY Blood Culture results may not be optimal due to an inadequate volume of blood received in culture bottles Performed at [facility] [address] [place], [place] [address]    Culture   Final    NO GROWTH 1 DAY Performed at [facility], [place] [address] [place], [place] [address]    Report Status PENDING  Incomplete  SARS Coronavirus 2 by RT PCR (hospital order, performed in [facility]  hospital lab) Nasopharyngeal Nasopharyngeal Swab     Status: None   Collection Time: 10/15/19  5:30 AM   Specimen: Nasopharyngeal Swab  Result Value Ref Range Status   SARS Coronavirus 2 NEGATIVE NEGATIVE Final    Comment: (NOTE) SARS-CoV-2 target nucleic acids are NOT DETECTED. The SARS-CoV-2 RNA is generally detectable in upper and lower respiratory specimens during the acute phase of infection. The lowest concentration of SARS-CoV-2 viral copies this assay can detect is 250 copies / mL. A negative result does not preclude SARS-CoV-2 infection and should not be used as the sole basis for treatment or other patient management decisions.  A negative result may occur with improper specimen collection / handling, submission of specimen other than nasopharyngeal swab, presence of viral mutation(s) within the areas targeted by this assay, and inadequate number of viral copies (<250 copies / mL). A negative result must be combined with clinical observations, patient history, and epidemiological information. Fact Sheet for Patients:   StrictlyIdeas.no Fact Sheet for Healthcare Providers: BankingDealers.co.za This test is not yet approved or cleared  by the [place] FDA and has been authorized for detection and/or diagnosis of SARS-CoV-2 by FDA under an Emergency Use Authorization (EUA).  This EUA will remain in effect (meaning  this test can be used) for the duration of the COVID-19 declaration under Section 564(b)(1) of the Act, 21 U.S.C. section 360bbb-3(b)(1), unless the authorization is terminated or revoked sooner. Performed at [facility], [place] [address], [place], [place] [address]   Urine culture     Status: None   Collection Time: 10/15/19  6:16 AM   Specimen: In/Out Cath Urine  Result Value Ref Range Status   Specimen Description   Final    IN/OUT CATH URINE Performed at [facility] [address] [place], [place] [address]    Special Requests   Final    NONE Performed at [facility], [place] [address], [place], [place] [address]    Culture   Final    NO GROWTH Performed at [facility], [place] [address], [place], [place] [address]    Report Status 10/16/2019 FINAL  Final  MRSA PCR Screening     Status: None   Collection Time: 10/15/19 10:34 AM   Specimen: Nasal Mucosa; Nasopharyngeal  Result Value Ref Range Status   MRSA by PCR NEGATIVE NEGATIVE Final    Comment:        The GeneXpert MRSA Assay (FDA approved for NASAL specimens only), is one component of a comprehensive MRSA colonization surveillance program. It is not intended to diagnose MRSA infection nor to guide or monitor treatment for MRSA infections. Performed at [facility], [place] [address] [place], [place] [address]     Radiology Reports CT ABDOMEN PELVIS W CONTRAST  Result Date: 10/15/2019 CLINICAL DATA:  Assess extent of sacral wound EXAM: CT ABDOMEN AND PELVIS WITH CONTRAST TECHNIQUE: Multidetector CT imaging of the abdomen and pelvis was performed using the standard protocol following bolus administration of intravenous contrast. CONTRAST:  12m OMNIPAQUE IOHEXOL 300 MG/ML  SOLN COMPARISON:  None. FINDINGS: Lower chest:  No contributory findings. Hepatobiliary: No focal liver abnormality.No evidence of biliary obstruction or stone. Pancreas: Unremarkable pancreas. Small duodenal diverticulum at the pancreatic head. Spleen: Unremarkable. Adrenals/Urinary Tract: Negative adrenals. No hydronephrosis or stone. Unremarkable bladder. Stomach/Bowel:  No obstruction. No appendicitis. Vascular/Lymphatic: No acute vascular abnormality. No mass or adenopathy. Reproductive:Unremarkable for age Other: No ascites or pneumoperitoneum. Musculoskeletal: Sacral decubitus ulcer is seen over the lower sacrum with tracking leftward in the subcutaneous fat by 2 cm. No collection  or bony erosion seen. Severe right hip osteoarthritis. Diffuse lumbar and lower thoracic degenerative disc narrowing with lumbar facet arthritis and L4-5, L5-S1 anterolisthesis. IMPRESSION: 1. Lower sacral decubitus ulcer with leftward subcutaneous tracking by 2 cm. No collection or visible osteomyelitis. 2. Advanced right hip and lumbar spine degeneration. Electronically Signed   By: JMonte FantasiaM.D.   On: 10/15/2019 04:49   DG Chest Port 1 View  Result Date: 10/15/2019 CLINICAL DATA:  Tachycardia EXAM: PORTABLE CHEST 1 VIEW COMPARISON:  07/20/2018 FINDINGS: The patient's chin obscures the apices. Hazy left lung opacity possibly due to rotation and soft tissue. No consolidation, pleural effusion or pneumothorax. IMPRESSION: Mild asymmetric hazy left thoracic opacity, suspected to be secondary to rotation and overlying soft tissues. No consolidative airspace disease. Electronically Signed   By: KDonavan FoilM.D.   On: 10/15/2019 02:39    Lab Data:  CBC: Recent Labs  Lab 10/15/19 0144 10/15/19 0257 10/16/19 0231  WBC 12.0*  --  8.5  NEUTROABS 10.0*  --   --   HGB 15.6* 18.7* 11.9*  HCT 48.9* 55.0* 37.8  MCV 96.6  --  97.4  PLT 302  --  301   Basic Metabolic Panel: Recent Labs  Lab 10/15/19 0144 10/15/19 0257 10/16/19 0231  NA 143 142 144  K 3.5 3.5 2.5*  CL 102 103 118*  CO2 28  --  22  GLUCOSE 141* 132* 99  BUN _0 CREATININE 0.50 0.40* <0.30*  CALCIUM 8.8*  --  7.9*   GFR: CrCl cannot be calculated (This lab value cannot be used to calculate CrCl because it is not a number: <0.30). Liver Function Tests: Recent Labs  Lab 10/15/19 0144 10/16/19 0231  AST 30 21  ALT 21 18  ALKPHOS 97 64  BILITOT 1.6* 0.6  PROT 7.3 5.0*  ALBUMIN 3.0* 2.0*   No results for input(s): LIPASE, AMYLASE in the last 168 hours. No results for input(s): AMMONIA in the last 168 hours. Coagulation Profile: Recent Labs  Lab 10/15/19 0144 10/16/19 0231  INR 1.2 1.3*   Cardiac  Enzymes: No results for input(s): CKTOTAL, CKMB, CKMBINDEX, TROPONINI in the last 168 hours. BNP (last 3 results) No results for input(s): PROBNP in the last 8760 hours. HbA1C: No results for input(s): HGBA1C in the last 72 hours. CBG: Recent Labs  Lab 10/16/19 0757  GLUCAP 136*   Lipid Profile: No results for input(s): CHOL, HDL, LDLCALC, TRIG, CHOLHDL, LDLDIRECT in the last 72 hours. Thyroid Function Tests: No results for input(s): TSH, T4TOTAL, FREET4, T3FREE, THYROIDAB in the last 72 hours. Anemia Panel: No results for input(s): VITAMINB12, FOLATE, FERRITIN, TIBC, IRON, RETICCTPCT in the last 72 hours. Urine analysis:    Component Value Date/Time   COLORURINE YELLOW 10/15/2019 0616   APPEARANCEUR CLEAR 10/15/2019 0616   LABSPEC >1.046 (H) 10/15/2019 0616   PHURINE 8.0 10/15/2019 0616   GLUCOSEU NEGATIVE 10/15/2019 0616   HGBUR NEGATIVE 10/15/2019 0616   BILIRUBINUR NEGATIVE 10/15/2019 0616   KETONESUR 5 (A) 10/15/2019 0616   PROTEINUR NEGATIVE 10/15/2019 0616   NITRITE POSITIVE (A) 10/15/2019 0616   LEUKOCYTESUR SMALL (A) 10/15/2019 7209     [place]ville Dani M.D. [facility] 10/16/2019, 10:34 AM   Call night coverage person covering after 7pm

## 2019-10-17 DIAGNOSIS — Z515 Encounter for palliative care: Secondary | ICD-10-CM

## 2019-10-17 DIAGNOSIS — F0280 Dementia in other diseases classified elsewhere without behavioral disturbance: Secondary | ICD-10-CM

## 2019-10-17 DIAGNOSIS — G300 Alzheimer's disease with early onset: Secondary | ICD-10-CM

## 2019-10-17 LAB — COMPREHENSIVE METABOLIC PANEL
ALT: 15 U/L (ref 0–44)
AST: 18 U/L (ref 15–41)
Albumin: 2 g/dL — ABNORMAL LOW (ref 3.5–5.0)
Alkaline Phosphatase: 59 U/L (ref 38–126)
Anion gap: 7 (ref 5–15)
BUN: 12 mg/dL (ref 8–23)
CO2: 21 mmol/L — ABNORMAL LOW (ref 22–32)
Calcium: 7.7 mg/dL — ABNORMAL LOW (ref 8.9–10.3)
Chloride: 113 mmol/L — ABNORMAL HIGH (ref 98–111)
Creatinine, Ser: 0.3 mg/dL — ABNORMAL LOW (ref 0.44–1.00)
GFR calc Af Amer: >60 mL/min (ref >60)
GFR calc non Af Amer: >60 mL/min (ref >60)
Glucose, Bld: 114 mg/dL — ABNORMAL HIGH (ref 70–99)
Potassium: 3.2 mmol/L — ABNORMAL LOW (ref 3.5–5.1)
Sodium: 141 mmol/L (ref 135–145)
Total Bilirubin: 0.8 mg/dL (ref 0.3–1.2)
Total Protein: 5.1 g/dL — ABNORMAL LOW (ref 6.5–8.1)

## 2019-10-17 MED ORDER — POTASSIUM CHLORIDE 10 MEQ/100ML IV SOLN
10.0000 meq | Freq: Once | INTRAVENOUS | Status: AC
  Administered 2019-10-17: 10 meq via INTRAVENOUS
  Filled 2019-10-17: qty 100

## 2019-10-17 MED ORDER — POTASSIUM CHLORIDE CRYS ER 20 MEQ PO TBCR: 40.0000 meq | EXTENDED_RELEASE_TABLET | Freq: Once | ORAL | Status: DC

## 2019-10-17 MED ORDER — POTASSIUM CHLORIDE 10 MEQ/100ML IV SOLN
10.0000 meq | INTRAVENOUS | Status: AC
  Administered 2019-10-17 (×2): 10 meq via INTRAVENOUS
  Filled 2019-10-17 (×2): qty 100

## 2019-10-17 NOTE — Consult Note (Signed)
Palliative Medicine   Name: Lacey Cook Date: 10/17/2019 MRN: [id number]  DOB: 08/02/1955  Patient Care Team: Richarda Osmond, DO as PCP - General (Family Medicine)    REASON FOR CONSULTATION: Lacey Cook is a 64 y.o. female with multiple medical problems including advanced Alzheimer's dementia, history of CVAs and seizures, who was hospitalized 07/21/2019-07/29/2019 with cellulitis and bilateral knee wounds after crawling on her knees following a fall.  Patient underwent surgical debridement by plastic surgery.  She was ultimately discharged to SNF at [facility] but it appears that patient eventually was discharged home from SNF under daughter's care.  Patient is now admitted to the hospital on 10/15/2019 with sepsis from an extensive sacral decubiti.  Patient is total care at baseline, contracted, and bedbound.  She has had declining oral intake and has been pocketing food.  Palliative care was consulted to help address goals.  SOCIAL HISTORY:     reports that she has never smoked. She has never used smokeless tobacco. She reports that she does not drink alcohol or use drugs.   Patient lives at home with her daughter.  ADVANCE DIRECTIVES:  None on file  CODE STATUS: Full code  PAST MEDICAL HISTORY: Past Medical History:  Diagnosis Date  . Chronic right hip pain   . Diabetes mellitus without complication ([place])   . Family history of malignant hyperthermia   . Hypertension   . Renal disorder   . Seizures ([place])   . Stroke [facility])     PAST SURGICAL HISTORY:  Past Surgical History:  Procedure Laterality Date  . APPLICATION OF A-CELL OF EXTREMITY Bilateral 07/27/2019   Procedure: placement of Acell;  Surgeon: Wallace Going, DO;  Location: [facility] ORS;  Service: Plastics;  Laterality: Bilateral;  . I & D EXTREMITY Bilateral 07/27/2019   Procedure: Debridement of bilateral knee wounds;  Surgeon: Wallace Going, DO;  Location: [facility];  Service: Plastics;  Laterality:  Bilateral;  1 hour, please    HEMATOLOGY/ONCOLOGY HISTORY:  Oncology History   No history exists.    ALLERGIES:  is allergic to shellfish allergy and sulfa antibiotics.  MEDICATIONS:  Current Facility-Administered Medications  Medication Dose Route Frequency Provider Last Rate Last Admin  . 0.9 %  sodium chloride infusion   Intravenous PRN Cherylann Ratel A, DO   Stopped at 10/17/19 [phone]  . acetaminophen (TYLENOL) tablet 650 mg  650 mg Oral Q6H PRN Marylyn Ishihara, Tyrone A, DO   650 mg at 10/15/19 2010   Or  . acetaminophen (TYLENOL) suppository 650 mg  650 mg Rectal Q6H PRN Marylyn Ishihara, Tyrone A, DO      . albuterol (PROVENTIL) (2.5 MG/3ML) 0.083% nebulizer solution 2.5 mg  2.5 mg Nebulization Q6H PRN Marylyn Ishihara, Tyrone A, DO      . ascorbic acid (VITAMIN C) 500 MG/5ML syrup 500 mg  500 mg Oral BID Marylyn Ishihara, Tyrone A, DO   500 mg at 10/17/19 0917  . ceFEPIme (MAXIPIME) 2 g in sodium chloride 0.9 % 100 mL IVPB  2 g Intravenous Q8H Angela Adam, [facility]   Stopped at 10/17/19 0345  . Chlorhexidine Gluconate Cloth 2 % PADS 6 each  6 each Topical Daily Kyle, Tyrone A, DO   6 each at 10/16/19 1604  . collagenase (SANTYL) ointment   Topical Daily Cherylann Ratel A, DO   Given at 10/17/19 [phone]  . enoxaparin (LOVENOX) injection 40 mg  40 mg Subcutaneous Q24H Kyle, Tyrone A, DO   40 mg at 10/16/19  1145  . feeding supplement (ENSURE ENLIVE) (ENSURE ENLIVE) liquid 237 mL  237 mL Oral TID BM Kyle, Tyrone A, DO   237 mL at 10/17/19 0918  . feeding supplement (PRO-STAT SUGAR FREE 64) liquid 30 mL  30 mL Oral TID BM Kyle, Tyrone A, DO   30 mL at 10/17/19 0917  . HYDROcodone-acetaminophen (NORCO/VICODIN) 5-325 MG per tablet 1-2 tablet  1-2 tablet Oral Q4H PRN Marylyn Ishihara, Tyrone A, DO      . metroNIDAZOLE (FLAGYL) IVPB 500 mg  500 mg Intravenous Q8H Kyle, Tyrone A, DO   Stopped at 10/17/19 0511  . morphine 2 MG/ML injection 1 mg  1 mg Intravenous Q4H PRN Marylyn Ishihara, Tyrone A, DO      . multivitamins with iron tablet 1 tablet  1 tablet Oral Daily  Kyle, Tyrone A, DO   1 tablet at 10/17/19 0918  . sodium chloride flush (NS) 0.9 % injection 3 mL  3 mL Intravenous Q12H Kyle, Tyrone A, DO   3 mL at 10/16/19 2125  . vancomycin (VANCOCIN) IVPB 1000 mg/200 mL premix  1,000 mg Intravenous Q24H Angela Adam, [facility]   Stopped at 10/17/19 0416  . venlafaxine (EFFEXOR) tablet 75 mg  75 mg Oral BID Marylyn Ishihara, Tyrone A, DO   75 mg at 10/17/19 0918  . zinc sulfate capsule 220 mg  220 mg Oral Daily Kyle, Tyrone A, DO   220 mg at 10/17/19 0918    VITAL SIGNS: BP (!) 105/59 (BP Location: Left Arm)   Pulse (!) 116   Temp 98.2 F (36.8 C) (Axillary)   Resp (!) 23   Ht 5\' 1"  (1.549 m)   Wt 195 lb 8 oz (88.7 kg)   SpO2 96%   BMI 36.94 kg/m  Filed Weights   10/15/19 0221  Weight: 195 lb 8 oz (88.7 kg)    Estimated body mass index is 36.94 kg/m as calculated from the following:   Height as of this encounter: 5\' 1"  (1.549 m).   Weight as of this encounter: 195 lb 8 oz (88.7 kg).  LABS: CBC:    Component Value Date/Time   WBC 8.5 10/16/2019 0231   HGB 11.9 (L) 10/16/2019 0231   HGB 12.8 11/24/2018 1552   HCT 37.8 10/16/2019 0231   HCT 37.9 11/24/2018 1552   PLT 221 10/16/2019 0231   PLT 258 11/24/2018 1552   MCV 97.4 10/16/2019 0231   MCV 95 11/24/2018 1552   NEUTROABS 10.0 (H) 10/15/2019 0144   LYMPHSABS 1.3 10/15/2019 0144   MONOABS 0.6 10/15/2019 0144   EOSABS 0.1 10/15/2019 0144   BASOSABS 0.0 10/15/2019 0144   Comprehensive Metabolic Panel:    Component Value Date/Time   NA 141 10/17/2019 0519   NA 143 11/24/2018 1552   K 3.2 (L) 10/17/2019 0519   CL 113 (H) 10/17/2019 0519   CO2 21 (L) 10/17/2019 0519   BUN 12 10/17/2019 0519   BUN 16 11/24/2018 1552   CREATININE 0.30 (L) 10/17/2019 0519   GLUCOSE 114 (H) 10/17/2019 0519   CALCIUM 7.7 (L) 10/17/2019 0519   AST 18 10/17/2019 0519   ALT 15 10/17/2019 0519   ALKPHOS 59 10/17/2019 0519   BILITOT 0.8 10/17/2019 0519   BILITOT 0.4 11/24/2018 1552   PROT 5.1 (L) 10/17/2019  0519   PROT 6.6 11/24/2018 1552   ALBUMIN 2.0 (L) 10/17/2019 0519   ALBUMIN 4.0 11/24/2018 1552    RADIOGRAPHIC STUDIES: CT ABDOMEN PELVIS W CONTRAST  Result Date: 10/15/2019 CLINICAL  DATA:  Assess extent of sacral wound EXAM: CT ABDOMEN AND PELVIS WITH CONTRAST TECHNIQUE: Multidetector CT imaging of the abdomen and pelvis was performed using the standard protocol following bolus administration of intravenous contrast. CONTRAST:  131mL OMNIPAQUE IOHEXOL 300 MG/ML  SOLN COMPARISON:  None. FINDINGS: Lower chest:  No contributory findings. Hepatobiliary: No focal liver abnormality.No evidence of biliary obstruction or stone. Pancreas: Unremarkable pancreas. Small duodenal diverticulum at the pancreatic head. Spleen: Unremarkable. Adrenals/Urinary Tract: Negative adrenals. No hydronephrosis or stone. Unremarkable bladder. Stomach/Bowel:  No obstruction. No appendicitis. Vascular/Lymphatic: No acute vascular abnormality. No mass or adenopathy. Reproductive:Unremarkable for age Other: No ascites or pneumoperitoneum. Musculoskeletal: Sacral decubitus ulcer is seen over the lower sacrum with tracking leftward in the subcutaneous fat by 2 cm. No collection or bony erosion seen. Severe right hip osteoarthritis. Diffuse lumbar and lower thoracic degenerative disc narrowing with lumbar facet arthritis and L4-5, L5-S1 anterolisthesis. IMPRESSION: 1. Lower sacral decubitus ulcer with leftward subcutaneous tracking by 2 cm. No collection or visible osteomyelitis. 2. Advanced right hip and lumbar spine degeneration. Electronically Signed   By: Monte Fantasia M.D.   On: 10/15/2019 04:49   DG Chest Port 1 View  Result Date: 10/15/2019 CLINICAL DATA:  Tachycardia EXAM: PORTABLE CHEST 1 VIEW COMPARISON:  07/20/2018 FINDINGS: The patient's chin obscures the apices. Hazy left lung opacity possibly due to rotation and soft tissue. No consolidation, pleural effusion or pneumothorax. IMPRESSION: Mild asymmetric hazy left  thoracic opacity, suspected to be secondary to rotation and overlying soft tissues. No consolidative airspace disease. Electronically Signed   By: Donavan Foil M.D.   On: 10/15/2019 02:39    PERFORMANCE STATUS (ECOG) : 4 - Bedbound  Review of Systems Unless otherwise noted, a complete review of systems is negative.  Physical Exam General: NAD, frail appearing Pulmonary: Unlabored Extremities: no edema, contractures Skin: Multiple ulcers noted but not visualized Neurological: Alert, nods head to questions is nonverbal  IMPRESSION: Patient is nonverbal and unable to engage me in a conversation regarding her goals.  No family is at bedside.  Patient appears to have sequelae from end-stage dementia including contractures, extensive wounds, and poor oral intake with pocketing of food.  Patient is currently a full code.  Will need to have an in-depth conversation with family regarding goals of care and future decision making including the role of hospitalization, procedures (PEG, surgeries, etc), code status, and hospice.  I called and spoke with patient's daughter by phone.  She did not have availability to talk to me over the phone as she was at work.  She did say that she could come to the hospital tomorrow afternoon around 3 PM to meet with palliative care if anyone was available.  Will try to coordinate family meeting.  PLAN: -Continue current scope of treatment -We will try to coordinate family meeting for tomorrow   Time Total: 30 minutes  Visit consisted of counseling and education dealing with the complex and emotionally intense issues of symptom management and palliative care in the setting of serious and potentially life-threatening illness.Greater than 50%  of this time was spent counseling and coordinating care related to the above assessment and plan.  Signed by: Altha Harm, PhD, NP-C

## 2019-10-17 NOTE — Progress Notes (Signed)
Patient unable to take PO meds this evening. This RN attempted to crush pills and mix with applesauce to no avail. Patient seemed to already had old meds in mouth when assessing. TR[facility] notified of meds not given. No new orders.

## 2019-10-17 NOTE — Progress Notes (Signed)
[facility]                                                                              Patient Demographics  Lacey Cook, is a 64 y.o. female, DOB - 07/24/1955, EFE:[id number]  Admit date - 10/15/2019   Admitting Physician Vianne Bulls, MD  Outpatient Primary MD for the patient is Richarda Osmond, DO  Outpatient specialists:   LOS - 2  days   Medical records reviewed and are as summarized below:    Chief Complaint  Patient presents with  . Skin Ulcer       Brief summary   Patient is a 64 year old female with dementia, hypertension, history of stroke, seizures, diabetes mellitus, chronic right hip pain, returned home from SNF.  History obtained from the daughter who reported that she was immobile and locked up at her knees and hips, it had been a struggle to keep her cleaned and attended to.  Patient's daughter became concerned after the sacral ulcer.  She felt that the patient was getting more weaker, worse mentation, appetite getting worse, hence brought to ED.. In ED, patient was noted to have multiple ulcerations, sepsis possibly from infected pressure ulcers.  ED Course: She was examined by ED stable. Multiple ulcerations noted. She was tachy, tachypneic with an elevated white count and lactic acidosis. She was started on sepsis protocol. [facility] was called for admission.    Assessment & Plan    Sepsis secondary to multiple pressure ulcers as listed below, infected pressure ulcer of sacrum and bilateral medial knees -Patient met sepsis criteria at the time of admission, with the lactic acidosis, tachycardia, tachypnea, leukocytosis -I's and O's with 8.9 L positive.  Continue broad-spectrum antibiotics with vancomycin, cefepime and Flagyl.   -Wound care consulted, likely will need hydrotherapy, appreciate recommendations -Discussed with RN, will place Foley tomorrow at the time of hydrotherapy when the patient will need to be moved in bed, currently  difficult with contractures.  Asthma -Currently stable, no wheezing, continue as needed albuterol  Dehydration, poor p.o. intake, moderate protein calorie malnutrition -will add prostat and zinc for wound healing, nutritional supplements -Encourage diet, DC IV fluids -PT OT consult  Alzheimer's dementia, depression -Continue Effexor, poor functional status, has been pocketing medications, concern for aspiration -Palliative medicine consulted for goals of care.   Chronic hip pain, contractures, knee pain, contractures, severe debility -CT abdomen pelvis showed severe right hip osteoarthritis, diffuse lumbar and lower thoracic degenerative disc narrowing with lumbar facet arthritis, L4-L5 L5-S1  Anterolisthesis. -PT OT consult, x-rays of the knees once more stable  Pressure injury documentation below: All present on admission  Pressure Injury 07/21/19 Knee Left (Active)  07/21/19   Location: Knee  Location Orientation: Left  Staging:   Wound Description (Comments):   Present on Admission: Yes     Pressure Injury 07/21/19 Knee Right Unstageable - Full thickness tissue loss in which the base of the injury is covered by slough (yellow, tan, gray, green or brown) and/or eschar (tan, brown or black) in the wound bed. Dry stable eschar (Active)  07/21/19   Location: Knee  Location Orientation: Right  Staging: Unstageable - Full thickness tissue loss in which the base of the injury is covered by slough (yellow, tan, gray, green or brown) and/or eschar (tan, brown or black) in the wound bed.  Wound Description (Comments): Dry stable eschar  Present on Admission: Yes     Pressure Injury 07/21/19 Heel Right serum filled blister (Active)  07/21/19   Location: Heel  Location Orientation: Right  Staging:   Wound Description (Comments): serum filled blister  Present on Admission: Yes     Pressure Injury 07/26/19 Buttocks Posterior;Mid Stage 1 -  Intact skin with non-blanchable redness of  a localized area usually over a bony prominence. (Active)  07/26/19 2301  Location: Buttocks  Location Orientation: Posterior;Mid  Staging: Stage 1 -  Intact skin with non-blanchable redness of a localized area usually over a bony prominence.  Wound Description (Comments):   Present on Admission:      Pressure Injury 10/15/19 Heel Left;Lateral Unstageable - Full thickness tissue loss in which the base of the injury is covered by slough (yellow, tan, gray, green or brown) and/or eschar (tan, brown or black) in the wound bed. (Active)  10/15/19 1234  Location: Heel  Location Orientation: Left;Lateral  Staging: Unstageable - Full thickness tissue loss in which the base of the injury is covered by slough (yellow, tan, gray, green or brown) and/or eschar (tan, brown or black) in the wound bed.  Wound Description (Comments):   Present on Admission: Yes     Pressure Injury 10/15/19 Heel Right;Lateral;Distal Deep Tissue Pressure Injury - Purple or maroon localized area of discolored intact skin or blood-filled blister due to damage of underlying soft tissue from pressure and/or shear. resolving (Active)  10/15/19 1235  Location: Heel  Location Orientation: Right;Lateral;Distal  Staging: Deep Tissue Pressure Injury - Purple or maroon localized area of discolored intact skin or blood-filled blister due to damage of underlying soft tissue from pressure and/or shear.  Wound Description (Comments): resolving  Present on Admission: Yes     Pressure Injury 10/15/19 Heel Posterior;Right;Lateral Deep Tissue Pressure Injury - Purple or maroon localized area of discolored intact skin or blood-filled blister due to damage of underlying soft tissue from pressure and/or shear. resolving (Active)  10/15/19 1235  Location: Heel  Location Orientation: Posterior;Right;Lateral  Staging: Deep Tissue Pressure Injury - Purple or maroon localized area of discolored intact skin or blood-filled blister due to damage of  underlying soft tissue from pressure and/or shear.  Wound Description (Comments): resolving  Present on Admission: Yes     Pressure Injury 10/15/19 Knee Left;Medial Deep Tissue Pressure Injury - Purple or maroon localized area of discolored intact skin or blood-filled blister due to damage of underlying soft tissue from pressure and/or shear. (Active)  10/15/19 1236  Location: Knee  Location Orientation: Left;Medial  Staging: Deep Tissue Pressure Injury - Purple or maroon localized area of discolored intact skin or blood-filled blister due to damage of underlying soft tissue from pressure and/or shear.  Wound Description (Comments):   Present on Admission: Yes     Pressure Injury 10/15/19 Knee Right;Medial Deep Tissue Pressure Injury - Purple or maroon localized area of discolored intact skin or blood-filled blister due to damage of underlying soft tissue from pressure and/or shear. (Active)  10/15/19 1236  Location: Knee  Location Orientation: Right;Medial  Staging: Deep Tissue Pressure Injury - Purple or maroon localized area of discolored intact skin or blood-filled blister due to damage of underlying soft tissue from pressure and/or shear.  Wound Description (Comments):  Present on Admission: Yes     Pressure Injury 10/15/19 Pretibial Left;Distal;Medial Deep Tissue Pressure Injury - Purple or maroon localized area of discolored intact skin or blood-filled blister due to damage of underlying soft tissue from pressure and/or shear. (Active)  10/15/19 1237  Location: Pretibial  Location Orientation: Left;Distal;Medial  Staging: Deep Tissue Pressure Injury - Purple or maroon localized area of discolored intact skin or blood-filled blister due to damage of underlying soft tissue from pressure and/or shear.  Wound Description (Comments):   Present on Admission: Yes     Pressure Injury 10/15/19 Sacrum Medial Stage 4 - Full thickness tissue loss with exposed bone, tendon or muscle. (Active)    10/15/19 1237  Location: Sacrum  Location Orientation: Medial  Staging: Stage 4 - Full thickness tissue loss with exposed bone, tendon or muscle.  Wound Description (Comments):   Present on Admission: Yes     Pressure Injury 10/15/19 Buttocks Left;Upper Unstageable - Full thickness tissue loss in which the base of the injury is covered by slough (yellow, tan, gray, green or brown) and/or eschar (tan, brown or black) in the wound bed. (Active)  10/15/19 1238  Location: Buttocks  Location Orientation: Left;Upper  Staging: Unstageable - Full thickness tissue loss in which the base of the injury is covered by slough (yellow, tan, gray, green or brown) and/or eschar (tan, brown or black) in the wound bed.  Wound Description (Comments):   Present on Admission: Yes     Pressure Injury 10/15/19 Ischial tuberosity Left;Distal Stage 2 -  Partial thickness loss of dermis presenting as a shallow open injury with a red, pink wound bed without slough. (Active)  10/15/19 1238  Location: Ischial tuberosity  Location Orientation: Left;Distal  Staging: Stage 2 -  Partial thickness loss of dermis presenting as a shallow open injury with a red, pink wound bed without slough.  Wound Description (Comments):   Present on Admission: Yes     Pressure Injury 10/15/19 Sacrum Mid Unstageable - Full thickness tissue loss in which the base of the injury is covered by slough (yellow, tan, gray, green or brown) and/or eschar (tan, brown or black) in the wound bed. *PT* sacral pressure injury, bone  (Active)  10/15/19 1510  Location: Sacrum  Location Orientation: Mid  Staging: Unstageable - Full thickness tissue loss in which the base of the injury is covered by slough (yellow, tan, gray, green or brown) and/or eschar (tan, brown or black) in the wound bed.  Wound Description (Comments): *PT* sacral pressure injury, bone palpable, not able to visualize base  Present on Admission: Yes    Obesity with protein calorie  malnutrition Estimated body mass index is 36.94 kg/m as calculated from the following:   Height as of this encounter: 5' 1"  (1.549 m).   Weight as of this encounter: 88.7 kg.  Code Status: Full CODE STATUS DVT Prophylaxis:  Lovenox  Family Communication: Discussed all imaging results, lab results, explained to the patient's daughter on the phone on 5/22   Disposition Plan:     Status is: Inpatient  Remains inpatient appropriate because:Inpatient level of care appropriate due to severity of illness, dementia, multiple pressure sores, infected, sepsis, on IV antibiotics   Dispo: The patient is from: Home              Anticipated d/c is to: SNF              Anticipated d/c date is: > 3 days  Patient currently is not medically stable to d/c.       Time Spent in minutes 35 minutes Procedures:  None  Consultants:   Wound care Palliative medicine  Antimicrobials:   Anti-infectives (From admission, onward)   Start     Dose/Rate Route Frequency Ordered Stop   10/16/19 0400  vancomycin (VANCOCIN) IVPB 1000 mg/200 mL premix     1,000 mg 200 mL/hr over 60 Minutes Intravenous Every 24 hours 10/15/19 1049     10/15/19 1200  metroNIDAZOLE (FLAGYL) IVPB 500 mg     500 mg 100 mL/hr over 60 Minutes Intravenous Every 8 hours 10/15/19 1119     10/15/19 1200  ceFEPIme (MAXIPIME) 2 g in sodium chloride 0.9 % 100 mL IVPB     2 g 200 mL/hr over 30 Minutes Intravenous Every 8 hours 10/15/19 1037     10/15/19 0315  ceFEPIme (MAXIPIME) 2 g in sodium chloride 0.9 % 100 mL IVPB     2 g 200 mL/hr over 30 Minutes Intravenous  Once 10/15/19 0305 10/15/19 0534   10/15/19 0315  metroNIDAZOLE (FLAGYL) IVPB 500 mg     500 mg 100 mL/hr over 60 Minutes Intravenous  Once 10/15/19 0305 10/15/19 0533   10/15/19 0315  vancomycin (VANCOCIN) IVPB 1000 mg/200 mL premix  Status:  Discontinued     1,000 mg 200 mL/hr over 60 Minutes Intravenous  Once 10/15/19 0305 10/15/19 0309   10/15/19 0315   vancomycin (VANCOREADY) IVPB 1500 mg/300 mL     1,500 mg 150 mL/hr over 120 Minutes Intravenous  Once 10/15/19 0310 10/15/19 0715         Medications  Scheduled Meds: . ascorbic acid  500 mg Oral BID  . Chlorhexidine Gluconate Cloth  6 each Topical Daily  . collagenase   Topical Daily  . enoxaparin (LOVENOX) injection  40 mg Subcutaneous Q24H  . feeding supplement (ENSURE ENLIVE)  237 mL Oral TID BM  . feeding supplement (PRO-STAT SUGAR FREE 64)  30 mL Oral TID BM  . multivitamins with iron  1 tablet Oral Daily  . sodium chloride flush  3 mL Intravenous Q12H  . venlafaxine  75 mg Oral BID  . zinc sulfate  220 mg Oral Daily   Continuous Infusions: . sodium chloride Stopped (10/17/19 0917)  . ceFEPime (MAXIPIME) IV Stopped (10/17/19 0345)  . metronidazole Stopped (10/17/19 0511)  . vancomycin Stopped (10/17/19 0416)   PRN Meds:.sodium chloride, acetaminophen **OR** acetaminophen, albuterol, HYDROcodone-acetaminophen, morphine injection      Subjective:   Lacey Cook was seen and examined today.  Alert awake and pleasant, however has dementia unable to obtain review of system from the patient.  Appears to be comfortable and stable.  Was pocketing medications, concern for aspiration.  No acute fevers or chills.    Objective:   Vitals:   10/17/19 0600 10/17/19 0700 10/17/19 0806 10/17/19 1000  BP: 122/60   (!) 105/59  Pulse: (!) 106 (!) 109  (!) 116  Resp: (!) 21 20  (!) 23  Temp:   98.2 F (36.8 C)   TempSrc:   Axillary   SpO2: 94% 94%  96%  Weight:      Height:        Intake/Output Summary (Last 24 hours) at 10/17/2019 1203 Last data filed at 10/17/2019 1000 Gross per 24 hour  Intake 3489.82 ml  Output 700 ml  Net 2789.82 ml     Wt Readings from Last 3 Encounters:  10/15/19 88.7 kg  07/27/19 84 kg  11/24/18 88.3 kg    Physical Exam  General: Alert, awake, pleasant, dementia  Cardiovascular: S1 S2 clear, RRR. No pedal edema b/l  Respiratory:  CTAB, no wheezing, rales or rhonchi  Gastrointestinal: Soft, nontender, nondistended, NBS  Ext: no pedal edema bilaterally  Neuro: Unable to assess,does not follow commands  Musculoskeletal: No cyanosis, clubbing  Skin: Multiple pressure sores as above  Psych: Dementia, alert and pleasant  Data Reviewed:  I have personally reviewed following labs and imaging studies  Micro Results Recent Results (from the past 240 hour(s))  Blood Culture (routine x 2)     Status: None (Preliminary result)   Collection Time: 10/15/19  1:44 AM   Specimen: BLOOD  Result Value Ref Range Status   Specimen Description   Final    BLOOD RIGHT ANTECUBITAL Performed at [facility] [address], [place], [place] [address]    Special Requests   Final    BOTTLES DRAWN AEROBIC AND ANAEROBIC Blood Culture adequate volume Performed at [facility] [address] [place], [place] [address]    Culture   Final    NO GROWTH 1 DAY Performed at [facility], [place] [address], [place], [place] [address]    Report Status PENDING  Incomplete  Blood Culture (routine x 2)     Status: None (Preliminary result)   Collection Time: 10/15/19  1:49 AM   Specimen: BLOOD  Result Value Ref Range Status   Specimen Description   Final    BLOOD BLOOD LEFT HAND Performed at [facility] [address] [place], [place] [address]    Special Requests   Final    BOTTLES DRAWN AEROBIC ONLY Blood Culture results may not be optimal due to an inadequate volume of blood received in culture bottles Performed at [facility] [address], [place], [place] [address]    Culture   Final    NO GROWTH 1 DAY Performed at [facility], [place] [address] [place], [place] [address]    Report Status PENDING  Incomplete  SARS Coronavirus 2 by RT PCR (hospital order, performed in [facility] hospital lab) Nasopharyngeal Nasopharyngeal Swab      Status: None   Collection Time: 10/15/19  5:30 AM   Specimen: Nasopharyngeal Swab  Result Value Ref Range Status   SARS Coronavirus 2 NEGATIVE NEGATIVE Final    Comment: (NOTE) SARS-CoV-2 target nucleic acids are NOT DETECTED. The SARS-CoV-2 RNA is generally detectable in upper and lower respiratory specimens during the acute phase of infection. The lowest concentration of SARS-CoV-2 viral copies this assay can detect is 250 copies / mL. A negative result does not preclude SARS-CoV-2 infection and should not be used as the sole basis for treatment or other patient management decisions.  A negative result may occur with improper specimen collection / handling, submission of specimen other than nasopharyngeal swab, presence of viral mutation(s) within the areas targeted by this assay, and inadequate number of viral copies (<250 copies / mL). A negative result must be combined with clinical observations, patient history, and epidemiological information. Fact Sheet for Patients:   StrictlyIdeas.no Fact Sheet for Healthcare Providers: BankingDealers.co.za This test is not yet approved or cleared  by the [place] FDA and has been authorized for detection and/or diagnosis of SARS-CoV-2 by FDA under an Emergency Use Authorization (EUA).  This EUA will remain in effect (meaning this test can be used) for the  duration of the COVID-19 declaration under Section 564(b)(1) of the Act, 21 U.S.C. section 360bbb-3(b)(1), unless the authorization is terminated or revoked sooner. Performed at [facility] [place] [address], [place], [place] [address]   Urine culture     Status: None   Collection Time: 10/15/19  6:16 AM   Specimen: In/Out Cath Urine  Result Value Ref Range Status   Specimen Description   Final    IN/OUT CATH URINE Performed at [facility] [address], [place], [place] [address]    Special  Requests   Final    NONE Performed at [facility], [place] [address] [place], [place] [address]    Culture   Final    NO GROWTH Performed at [facility], [place] [address] [place], [place] [address]    Report Status 10/16/2019 FINAL  Final  MRSA PCR Screening     Status: None   Collection Time: 10/15/19 10:34 AM   Specimen: Nasal Mucosa; Nasopharyngeal  Result Value Ref Range Status   MRSA by PCR NEGATIVE NEGATIVE Final    Comment:        The GeneXpert MRSA Assay (FDA approved for NASAL specimens only), is one component of a comprehensive MRSA colonization surveillance program. It is not intended to diagnose MRSA infection nor to guide or monitor treatment for MRSA infections. Performed at [facility], [place] [address], [place],  [address]     Radiology Reports CT ABDOMEN PELVIS W CONTRAST  Result Date: 10/15/2019 CLINICAL DATA:  Assess extent of sacral wound EXAM: CT ABDOMEN AND PELVIS WITH CONTRAST TECHNIQUE: Multidetector CT imaging of the abdomen and pelvis was performed using the standard protocol following bolus administration of intravenous contrast. CONTRAST:  126m OMNIPAQUE IOHEXOL 300 MG/ML  SOLN COMPARISON:  None. FINDINGS: Lower chest:  No contributory findings. Hepatobiliary: No focal liver abnormality.No evidence of biliary obstruction or stone. Pancreas: Unremarkable pancreas. Small duodenal diverticulum at the pancreatic head. Spleen: Unremarkable. Adrenals/Urinary Tract: Negative adrenals. No hydronephrosis or stone. Unremarkable bladder. Stomach/Bowel:  No obstruction. No appendicitis. Vascular/Lymphatic: No acute vascular abnormality. No mass or adenopathy. Reproductive:Unremarkable for age Other: No ascites or pneumoperitoneum. Musculoskeletal: Sacral decubitus ulcer is seen over the lower sacrum with tracking leftward in the subcutaneous fat by 2 cm. No collection or bony erosion seen. Severe right hip  osteoarthritis. Diffuse lumbar and lower thoracic degenerative disc narrowing with lumbar facet arthritis and L4-5, L5-S1 anterolisthesis. IMPRESSION: 1. Lower sacral decubitus ulcer with leftward subcutaneous tracking by 2 cm. No collection or visible osteomyelitis. 2. Advanced right hip and lumbar spine degeneration. Electronically Signed   By: JMonte FantasiaM.D.   On: 10/15/2019 04:49   DG Chest Port 1 View  Result Date: 10/15/2019 CLINICAL DATA:  Tachycardia EXAM: PORTABLE CHEST 1 VIEW COMPARISON:  07/20/2018 FINDINGS: The patient's chin obscures the apices. Hazy left lung opacity possibly due to rotation and soft tissue. No consolidation, pleural effusion or pneumothorax. IMPRESSION: Mild asymmetric hazy left thoracic opacity, suspected to be secondary to rotation and overlying soft tissues. No consolidative airspace disease. Electronically Signed   By: KDonavan FoilM.D.   On: 10/15/2019 02:39    Lab Data:  CBC: Recent Labs  Lab 10/15/19 0144 10/15/19 0257 10/16/19 0231  WBC 12.0*  --  8.5  NEUTROABS 10.0*  --   --   HGB 15.6* 18.7* 11.9*  HCT 48.9* 55.0* 37.8  MCV 96.6  --  97.4  PLT 302  --  582   Basic Metabolic Panel: Recent Labs  Lab 10/15/19 0144 10/15/19 0257 10/16/19 0231 10/16/19 1224 10/17/19 0519  NA 143 142 144  --  141  K 3.5 3.5 2.5* 2.9* 3.2*  CL 102 103 118*  --  113*  CO2 28  --  22  --  21*  GLUCOSE 141* 132* 99  --  114*  BUN 18 17 16   --  12  CREATININE 0.50 0.40* <0.30*  --  0.30*  CALCIUM 8.8*  --  7.9*  --  7.7*  MG  --   --  1.9  --   --    GFR: Estimated Creatinine Clearance: 73.9 mL/min (A) (by C-G formula based on SCr of 0.3 mg/dL (L)). Liver Function Tests: Recent Labs  Lab 10/15/19 0144 10/16/19 0231 10/17/19 0519  AST 30 21 18   ALT 21 18 15   ALKPHOS 97 64 59  BILITOT 1.6* 0.6 0.8  PROT 7.3 5.0* 5.1*  ALBUMIN 3.0* 2.0* 2.0*   No results for input(s): LIPASE, AMYLASE in the last 168 hours. No results for input(s): AMMONIA in  the last 168 hours. Coagulation Profile: Recent Labs  Lab 10/15/19 0144 10/16/19 0231  INR 1.2 1.3*   Cardiac Enzymes: No results for input(s): CKTOTAL, CKMB, CKMBINDEX, TROPONINI in the last 168 hours. BNP (last 3 results) No results for input(s): PROBNP in the last 8760 hours. HbA1C: No results for input(s): HGBA1C in the last 72 hours. CBG: Recent Labs  Lab 10/16/19 0757 10/16/19 1207  GLUCAP 136* 133*   Lipid Profile: No results for input(s): CHOL, HDL, LDLCALC, TRIG, CHOLHDL, LDLDIRECT in the last 72 hours. Thyroid Function Tests: No results for input(s): TSH, T4TOTAL, FREET4, T3FREE, THYROIDAB in the last 72 hours. Anemia Panel: No results for input(s): VITAMINB12, FOLATE, FERRITIN, TIBC, IRON, RETICCTPCT in the last 72 hours. Urine analysis:    Component Value Date/Time   COLORURINE YELLOW 10/15/2019 0616   APPEARANCEUR CLEAR 10/15/2019 0616   LABSPEC >1.046 (H) 10/15/2019 0616   PHURINE 8.0 10/15/2019 0616   GLUCOSEU NEGATIVE 10/15/2019 0616   HGBUR NEGATIVE 10/15/2019 0616   BILIRUBINUR NEGATIVE 10/15/2019 0616   KETONESUR 5 (A) 10/15/2019 0616   PROTEINUR NEGATIVE 10/15/2019 0616   NITRITE POSITIVE (A) 10/15/2019 0616   LEUKOCYTESUR SMALL (A) 10/15/2019 5189     Lothar Prehn M.D. [facility] 10/17/2019, 12:03 PM   Call night coverage person covering after 7pm

## 2019-10-18 NOTE — Progress Notes (Signed)
Informed daughter before she left at 2030 that physician has given transfer orders to move to floor bed tonight. Advised when bed assigned, would call her to inform.

## 2019-10-18 NOTE — Progress Notes (Signed)
Palliative care progress note  I saw Ms. Gulledge briefly today.  I was planning to meet with her daughter today at 1500, but she is not present.   I spoke with RN who will let me know if family arrives and I will attempt to see them I at that time if possible (it may need to be another day as I have other meetings scheduled for this afternoon).  Micheline Rough, MD [facility]iative Medicine Team [phone]  NO CHARGE NOTE

## 2019-10-18 NOTE — Progress Notes (Signed)
Report given to [employer]. Awaiting transport.

## 2019-10-18 NOTE — Progress Notes (Signed)
   10/18/19 2309  Assess: MEWS Score  Temp 98.1 F (36.7 C)  BP 109/65  Pulse Rate (!) 125  Resp 18  Level of Consciousness Alert  SpO2 96 %  O2 Device Room Air  Assess: MEWS Score  MEWS Temp 0  MEWS Systolic 0  MEWS Pulse 2  MEWS RR 0  MEWS LOC 0  MEWS Score 2  MEWS Score Color Yellow  Assess: if the MEWS score is Yellow or Red  Were vital signs taken at a resting state? Yes  Focused Assessment Documented focused assessment  MEWS guidelines implemented *See Row Information* No, previously yellow, continue vital signs every 4 hours  Treat  MEWS Interventions Other (Comment) (informed charge nurse)  Escalate  MEWS: Escalate Yellow: discuss with charge nurse/RN and consider discussing with provider and RRT  Notify: Charge Nurse/RN  Name of Charge Nurse/RN Notified Myriam Jacobson RN  Date Charge Nurse/RN Notified 10/18/19  Time Charge Nurse/RN Notified 2313  Document  Progress note created (see row info) Yes   Will take vitals q4 per unit protocol. Will continue to monitor pt for any changes.

## 2019-10-18 NOTE — Progress Notes (Signed)
   10/18/19 1340  Hydrotherapy treatment [phone]   Subjective Assessment  Subjective grabs tightly rails, person near her.  Patient and Family Stated Goals pt unable, no family present   Date of Onset  (present on admit)  Prior Treatments unsure (u)  Evaluation and Treatment  Evaluation and Treatment Procedures Explained to Patient/Family Yes  Evaluation and Treatment Procedures Patient unable to consent due to mental status  Pressure Injury 10/15/19 Sacrum Mid Unstageable - Full thickness tissue loss in which the base of the injury is covered by slough (yellow, tan, gray, green or brown) and/or eschar (tan, brown or black) in the wound bed. *PT* sacral pressure injury, bone   Date First Assessed/Time First Assessed: 10/15/19 1510   Location: Sacrum  Location Orientation: Mid  Staging: Unstageable - Full thickness tissue loss in which the base of the injury is covered by slough (yellow, tan, gray, green or brown) and/or esc...  Dressing Type Foam - Lift dressing to assess site every shift;Moist to dry;Other (Comment)  Dressing Changed  Dressing Change Frequency Daily  State of Healing Non-healing  Site / Wound Assessment Pink;Yellow;Friable  % Wound base Red or Granulating 20%  % Wound base Yellow/Fibrinous Exudate 25%  % Wound base Black/Eschar 50%  % Wound base Other/Granulation Tissue (Comment)  (bone palpable)  Peri-wound Assessment Excoriated;Pink;Erythema (non-blanchable)  Wound Length (cm) 3 cm  Wound Width (cm) 3.5 cm  Wound Depth (cm) 1.5 cm  Wound Surface Area (cm^2) 10.5 cm^2  Wound Volume (cm^3) 15.75 cm^3  Tunneling (cm) 4 (at 6 oclock)  Undermining (cm) 3 (entire wound edges)  Margins Unattached edges (unapproximated)  Drainage Amount Moderate  Drainage Description Serosanguineous;Purulent  Treatment Cleansed;Debridement (Selective);Hydrotherapy (Pulse lavage);Packing (Saline gauze)  Hydrotherapy  Pulsed lavage therapy - wound location sacrum  Pulsed  Lavage with Suction (psi) 8 psi-12  Pulsed Lavage with Suction - Normal Saline Used 1000 mL  Pulsed Lavage Tip Tip with splash shield  Selective Debridement  Selective Debridement - Location sacrum  Selective Debridement - Tools Used Forceps;Scissors  Selective Debridement - Tissue Removed black/grey necrotic tissue  Wound Therapy - Assess/Plan/Recommendations  Wound Therapy - Clinical Statement pt will benefit from hydrotherapy to facilitate wound healing and decr bioburden. Necrotic  eschar removed from small wound adjacent to the larger  wound at gluteal cleft, measeured  1 cm  Wound Therapy - Functional Problem List grossly bed bound/immobile  Factors Delaying/Impairing Wound Healing Immobility;Multiple medical problems;Infection - systemic/local  Hydrotherapy Plan Debridement;Dressing change;Patient/family education;Pulsatile lavage with suction  Wound Therapy - Frequency Other (comment)  Wound Therapy - Follow Up Recommendations Skilled nursing facility  Wound Plan continue hydrotherapy per orders M-Th for cleansing/debridement  Wound Therapy Goals - Improve the function of patient's integumentary system by progressing the wound(s) through the phases of wound healing by:  Decrease Necrotic Tissue to 70  Decrease Necrotic Tissue - Progress Progressing toward goal  Increase Granulation Tissue to 30  Increase Granulation Tissue - Progress Progressing toward goal  Improve Drainage Characteristics Mod  Improve Drainage Characteristics - Progress Progressing toward goal  Goals/treatment plan/discharge plan were made with and agreed upon by patient/family No, Patient unable to participate in goals/treatment/discharge plan and family unavailable  Time For Goal Achievement 2 weeks  Wound Therapy - Potential for Goals Poor  Tresa Endo PT Acute Rehabilitation Services Pager [phone] Office [phone]

## 2019-10-18 NOTE — Progress Notes (Signed)
Pharmacy Antibiotic Note  Lacey Cook is a 64 y.o. female admitted on 10/15/2019 with sepsis secondary to pressure ulcers. Pharmacy has been consulted for cefepime and vancomycin dosing. 10/18/2019 D#4 vanc/cefepime/flagyl WBC WNL 5/22, AF, SCr WNL  5/21 CT: sacral decub w/ SQ tracking, no collection or visible osteo  Plan: Continue Cefepime 2gm IV q8h Continue vancomycin 1 gm IV q24 Continue flagyl 500 mg IV q8h Consider de-escalating abx Follow renal function and clinical course  Height: 5\' 1"  (154.9 cm) Weight: 88.7 kg (195 lb 8 oz) IBW/kg (Calculated) : 47.8  Temp (24hrs), Avg:98.6 F (37 C), Min:97.5 F (36.4 C), Max:99.1 F (37.3 C)  Recent Labs  Lab 10/15/19 0144 10/15/19 0257 10/15/19 0344 10/16/19 0231 10/17/19 0519  WBC 12.0*  --   --  8.5  --   CREATININE 0.50 0.40*  --  <0.30* 0.30*  LATICACIDVEN 3.3*  --  1.7  --   --     Estimated Creatinine Clearance: 73.9 mL/min (A) (by C-G formula based on SCr of 0.3 mg/dL (L)).    Allergies  Allergen Reactions  . Shellfish Allergy Anaphylaxis    Throat swells  . Sulfa Antibiotics Anaphylaxis    Throat Swelling  Antimicrobials this admission: 5/21 cefepime >> 5/21 vancomycin >> 5/21 flagyl >> Dose adjustments this admission:    Microbiology results: 5/21 IHW:TUUE 5/21 UCx: NGF  5/21 MRSA PCR: neg  Thank you for allowing pharmacy to be a part of this patient's care.  Eudelia Bunch, Pharm.D 10/18/2019 1:46 PM
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
## 2019-10-19 DIAGNOSIS — Z7189 Other specified counseling: Secondary | ICD-10-CM

## 2019-10-19 DIAGNOSIS — R531 Weakness: Secondary | ICD-10-CM

## 2019-10-19 LAB — BASIC METABOLIC PANEL
Anion gap: 7 (ref 5–15)
BUN: 11 mg/dL (ref 8–23)
CO2: 24 mmol/L (ref 22–32)
Calcium: 7.6 mg/dL — ABNORMAL LOW (ref 8.9–10.3)
Chloride: 105 mmol/L (ref 98–111)
Creatinine, Ser: <0.3 mg/dL — ABNORMAL LOW (ref 0.44–1.00)
Glucose, Bld: 96 mg/dL (ref 70–99)
Potassium: 3.1 mmol/L — ABNORMAL LOW (ref 3.5–5.1)
Sodium: 136 mmol/L (ref 135–145)

## 2019-10-19 MED ORDER — POTASSIUM CHLORIDE 20 MEQ PO PACK: 20.0000 meq | PACK | Freq: Every day | ORAL | Status: DC

## 2019-10-19 MED ORDER — POTASSIUM CHLORIDE 20 MEQ PO PACK
20.0000 meq | PACK | Freq: Every day | ORAL | Status: AC
  Administered 2019-10-19 – 2019-10-21 (×3): 20 meq via ORAL
  Filled 2019-10-19 (×3): qty 1

## 2019-10-19 MED ORDER — SODIUM CHLORIDE 0.9 % IV SOLN
2.0000 g | INTRAVENOUS | Status: AC
  Administered 2019-10-19 – 2019-10-24 (×6): 2 g via INTRAVENOUS
  Filled 2019-10-19 (×6): qty 2

## 2019-10-19 NOTE — NC FL2 (Signed)
[place] MEDICAID FL2 LEVEL OF CARE SCREENING TOOL     IDENTIFICATION  Patient Name: Lacey Cook Birthdate: March 21, 1956 Sex: female Admission Date (Current Location): 10/15/2019  [facility] and [place] Number:  Herbalist and Address:  [facility],  [place] [address], [place]      Provider Number: [id number]  Attending Physician Name and Address:  Mendel Corning, MD  Relative Name and Phone Number:  Anna Genre dtr [phone]    Current Level of Care: Hospital Recommended Level of Care: [place] Prior Approval Number:    Date Approved/Denied:   PASRR Number: [id number]  Discharge Plan: SNF    Current Diagnoses: Patient Active Problem List   Diagnosis Date Noted  . Palliative care encounter   . Infected pressure ulcer 10/15/2019  . Sepsis ([place]) 10/15/2019  . Lactic acidosis 10/15/2019  . Inadequate oral intake 10/15/2019  . Hip pain, bilateral 10/15/2019  . Bilateral knee pain 10/15/2019  . Pressure injury of skin 07/27/2019  . Cellulitis of knee 07/21/2019  . Effusion of right knee 07/21/2019  . Alzheimer's dementia ([place]) 07/21/2019  . Elevated LFTs 07/21/2019  . Encounter for medication review 12/17/2018  . Arthritis 11/30/2018  . Intertriginous dermatitis associated with moisture 11/30/2018  . Asthma 11/30/2018  . Depression, recurrent ([place]) 11/30/2018  . Adjustment disorder with disturbance of emotion 12/07/2017    Orientation RESPIRATION BLADDER Height & Weight        Normal Incontinent Weight: 88.7 kg Height:  5\' 1"  (154.9 cm)  BEHAVIORAL SYMPTOMS/MOOD NEUROLOGICAL BOWEL NUTRITION STATUS      Incontinent    AMBULATORY STATUS COMMUNICATION OF NEEDS Skin   Total Care(w/c bound)   PU Stage and Appropriate Care       PU Stage 4 Dressing: (sacral pressure ulcer unstageable-w-dry dsg qd.)               Personal Care Assistance Level of Assistance  Bathing, Feeding, Dressing Bathing Assistance:  Maximum assistance Feeding assistance: Maximum assistance Dressing Assistance: Maximum assistance     Functional Limitations Therapist, nutritional, Speech Sight Info: Impaired(eyeglasses) Hearing Info: Adequate Speech Info: Impaired(nonverbal)    SPECIAL CARE FACTORS FREQUENCY  PT (By licensed PT), OT (By licensed OT)     PT Frequency: 5x week OT Frequency: 5x week            Contractures Contractures Info: Present(Bilateral LLE)    Additional Factors Info  Code Status, Allergies Code Status Info: Full code Allergies Info: Shellfish allergy;Sulfa antibotics           Current Medications (10/19/2019):  This is the current hospital active medication list Current Facility-Administered Medications  Medication Dose Route Frequency Provider Last Rate Last Admin  . 0.9 %  sodium chloride infusion   Intravenous PRN Cherylann Ratel A, DO   Stopped at 10/19/19 0136  . acetaminophen (TYLENOL) tablet 650 mg  650 mg Oral Q6H PRN Marylyn Ishihara, Tyrone A, DO   650 mg at 10/15/19 2010   Or  . acetaminophen (TYLENOL) suppository 650 mg  650 mg Rectal Q6H PRN Marylyn Ishihara, Tyrone A, DO      . albuterol (PROVENTIL) (2.5 MG/3ML) 0.083% nebulizer solution 2.5 mg  2.5 mg Nebulization Q6H PRN Marylyn Ishihara, Tyrone A, DO      . ascorbic acid (VITAMIN C) 500 MG/5ML syrup 500 mg  500 mg Oral BID Kyle, Tyrone A, DO   500 mg at 10/19/19 1009  . cefTRIAXone (ROCEPHIN) 2 g in sodium  chloride 0.9 % 100 mL IVPB  2 g Intravenous Q24H Rai, Ripudeep K, MD 200 mL/hr at 10/19/19 1154 2 g at 10/19/19 1154  . Chlorhexidine Gluconate Cloth 2 % PADS 6 each  6 each Topical Daily Kyle, Tyrone A, DO   6 each at 10/19/19 1009  . collagenase (SANTYL) ointment   Topical Daily Marylyn Ishihara, Tyrone A, DO   Given at 10/19/19 1012  . enoxaparin (LOVENOX) injection 40 mg  40 mg Subcutaneous Q24H Kyle, Tyrone A, DO   40 mg at 10/19/19 1009  . feeding supplement (ENSURE ENLIVE) (ENSURE ENLIVE) liquid 237 mL  237 mL Oral TID BM Kyle, Tyrone A, DO   237 mL at 10/19/19 1009   . feeding supplement (PRO-STAT SUGAR FREE 64) liquid 30 mL  30 mL Oral TID BM Kyle, Tyrone A, DO   30 mL at 10/19/19 1009  . HYDROcodone-acetaminophen (NORCO/VICODIN) 5-325 MG per tablet 1-2 tablet  1-2 tablet Oral Q4H PRN Marylyn Ishihara, Tyrone A, DO      . metroNIDAZOLE (FLAGYL) IVPB 500 mg  500 mg Intravenous Q8H Kyle, Tyrone A, DO 100 mL/hr at 10/19/19 1014 500 mg at 10/19/19 1014  . morphine 2 MG/ML injection 1 mg  1 mg Intravenous Q4H PRN Marylyn Ishihara, Tyrone A, DO   1 mg at 10/18/19 2005  . multivitamins with iron tablet 1 tablet  1 tablet Oral Daily Kyle, Tyrone A, DO   1 tablet at 10/19/19 1012  . potassium chloride (KLOR-CON) packet 20 mEq  20 mEq Oral Daily Rai, Ripudeep K, MD   20 mEq at 10/19/19 1012  . sodium chloride flush (NS) 0.9 % injection 3 mL  3 mL Intravenous Q12H Kyle, Tyrone A, DO   3 mL at 10/18/19 2018  . venlafaxine (EFFEXOR) tablet 75 mg  75 mg Oral BID Marylyn Ishihara, Tyrone A, DO   75 mg at 10/19/19 1012  . zinc sulfate capsule 220 mg  220 mg Oral Daily Kyle, Tyrone A, DO   220 mg at 10/19/19 1014     Discharge Medications: Please see discharge summary for a list of discharge medications.  Relevant Imaging Results:  Relevant Lab Results:   Additional Information [id number]  Mahabir, Juliann Pulse, RN

## 2019-10-19 NOTE — Progress Notes (Signed)
Daily Progress Note   Patient Name: Lacey Cook       Date: 10/19/2019 DOB: 10/20/1955  Age: 64 y.o. MRN#: [id number] Attending Physician: Mendel Corning, MD Primary Care Physician: Richarda Osmond, DO Admit Date: 10/15/2019  Reason for Consultation/Follow-up: Establishing goals of care  Subjective:  patient seen briefly, alongside PT/hydro therapy colleagues who were assessing the patient and applying appropriate dressings. Wound noted. No family at bedside, chart reviewed see below.  PPS 30% Length of Stay: 4  Current Medications: Scheduled Meds:  . ascorbic acid  500 mg Oral BID  . Chlorhexidine Gluconate Cloth  6 each Topical Daily  . collagenase   Topical Daily  . enoxaparin (LOVENOX) injection  40 mg Subcutaneous Q24H  . feeding supplement (ENSURE ENLIVE)  237 mL Oral TID BM  . feeding supplement (PRO-STAT SUGAR FREE 64)  30 mL Oral TID BM  . multivitamins with iron  1 tablet Oral Daily  . potassium chloride  20 mEq Oral Daily  . sodium chloride flush  3 mL Intravenous Q12H  . venlafaxine  75 mg Oral BID  . zinc sulfate  220 mg Oral Daily    Continuous Infusions: . sodium chloride Stopped (10/19/19 0136)  . cefTRIAXone (ROCEPHIN)  IV 2 g (10/19/19 1154)  . metronidazole 500 mg (10/19/19 1014)    PRN Meds: sodium chloride, acetaminophen **OR** acetaminophen, albuterol, HYDROcodone-acetaminophen, morphine injection  Physical Exam         Appears frail and weak Resting in bed Patient has mid low sacrum ulcers and tissue loss. Patient has other skin changes throughout perineum. Wounds with dressings applied and appropriate dressing was applied by PT staff.  Appears to have regular work of breathing Not in distress  Vital Signs: BP 119/63 (BP Location: Right Arm)    Pulse (!) 108   Temp 99 F (37.2 C) (Oral)   Resp 20   Ht 5\' 1"  (1.549 m)   Wt 88.7 kg   SpO2 92%   BMI 36.94 kg/m  SpO2: SpO2: 92 % O2 Device: O2 Device: Room Air O2 Flow Rate:    Intake/output summary:   Intake/Output Summary (Last 24 hours) at 10/19/2019 1628 Last data filed at 10/19/2019 1500 Gross per 24 hour  Intake 1145.13 ml  Output 550 ml  Net 595.13 ml   LBM:  Last BM Date: 10/18/19 Baseline Weight: Weight: 88.7 kg Most recent weight: Weight: 88.7 kg       Palliative Assessment/Data:      Patient Active Problem List   Diagnosis Date Noted  . Palliative care encounter   . Infected pressure ulcer 10/15/2019  . Sepsis ([place]) 10/15/2019  . Lactic acidosis 10/15/2019  . Inadequate oral intake 10/15/2019  . Hip pain, bilateral 10/15/2019  . Bilateral knee pain 10/15/2019  . Pressure injury of skin 07/27/2019  . Cellulitis of knee 07/21/2019  . Effusion of right knee 07/21/2019  . Alzheimer's dementia ([place]) 07/21/2019  . Elevated LFTs 07/21/2019  . Encounter for medication review 12/17/2018  . Arthritis 11/30/2018  . Intertriginous dermatitis associated with moisture 11/30/2018  . Asthma 11/30/2018  . Depression, recurrent ([place]) 11/30/2018  . Adjustment disorder with disturbance of emotion 12/07/2017    Palliative Care Assessment & Plan   Patient Profile:    Assessment:  Sepsis secondary to multiple pressure ulcers as listed below, infected pressure ulcer of sacrum and bilateral medial knees Dementia Chronic contractures pain and debility functional ongoing decline   Recommendations/Plan:  Continue wound care and current mode of care  Recommend palliative care following up with the patient at SNF for symptom management, addressing HCPOA and for ongoing goals of care discussions.   Goals of Care and Additional Recommendations:  Limitations on Scope of Treatment: Full Scope Treatment  Code Status:    Code Status Orders  (From admission,  onward)         Start     Ordered   10/15/19 1120  Full code  Continuous     10/15/19 1119        Code Status History    Date Active Date Inactive Code Status Order ID Comments User Context   07/21/2019 1935 07/29/2019 1956 Full Code [id number]  [place] Eric J, [facility]   12/06/2017 2053 12/07/2017 1643 Full Code [id number]  Charlesetta Shanks, MD ED   Advance Care Planning Activity       Prognosis:   Unable to determine  Discharge Planning:  [place] for rehab with Palliative care service follow-up  Care plan was discussed with  IDT  Thank you for allowing the Palliative Medicine Team to assist in the care of this patient.   Time In: 1500 Time Out: 1525 Total Time  25 Prolonged Time Billed  no       Greater than 50%  of this time was spent counseling and coordinating care related to the above assessment and plan.  Loistine Chance, MD  Please contact Palliative Medicine Team phone at [phone] for questions and concerns.

## 2019-10-19 NOTE — Plan of Care (Signed)

## 2019-10-19 NOTE — NC FL2 (Signed)
[place] MEDICAID FL2 LEVEL OF CARE SCREENING TOOL     IDENTIFICATION  Patient Name: Lacey Cook Birthdate: May 27, 1956 Sex: female Admission Date (Current Location): 10/15/2019  [facility] and [place] Number:  Herbalist and Address:  [facility],  [place] [address] [place]      Provider Number: [id number]  Attending Physician Name and Address:  Mendel Corning, MD  Relative Name and Phone Number:  Anna Genre dtr [phone]    Current Level of Care: Hospital Recommended Level of Care: [place] Prior Approval Number:    Date Approved/Denied:   PASRR Number: [id number]  Discharge Plan: SNF    Current Diagnoses: Patient Active Problem List   Diagnosis Date Noted  . Palliative care encounter   . Infected pressure ulcer 10/15/2019  . Sepsis ([place]) 10/15/2019  . Lactic acidosis 10/15/2019  . Inadequate oral intake 10/15/2019  . Hip pain, bilateral 10/15/2019  . Bilateral knee pain 10/15/2019  . Pressure injury of skin 07/27/2019  . Cellulitis of knee 07/21/2019  . Effusion of right knee 07/21/2019  . Alzheimer's dementia ([place]) 07/21/2019  . Elevated LFTs 07/21/2019  . Encounter for medication review 12/17/2018  . Arthritis 11/30/2018  . Intertriginous dermatitis associated with moisture 11/30/2018  . Asthma 11/30/2018  . Depression, recurrent ([place]) 11/30/2018  . Adjustment disorder with disturbance of emotion 12/07/2017    Orientation RESPIRATION BLADDER Height & Weight        Normal Incontinent Weight: 88.7 kg Height:  5\' 1"  (154.9 cm)  BEHAVIORAL SYMPTOMS/MOOD NEUROLOGICAL BOWEL NUTRITION STATUS      Incontinent    AMBULATORY STATUS COMMUNICATION OF NEEDS Skin   Total Care(w/c bound)   PU Stage and Appropriate Care       PU Stage 4 Dressing: (sacral pressure ulcer unstageable-w-dry dsg qd.)               Personal Care Assistance Level of Assistance  Bathing, Feeding, Dressing Bathing Assistance:  Maximum assistance Feeding assistance: Maximum assistance Dressing Assistance: Maximum assistance     Functional Limitations Therapist, nutritional, Speech Sight Info: Impaired(eyeglasses) Hearing Info: Adequate Speech Info: Impaired(nonverbal)    SPECIAL CARE FACTORS FREQUENCY  PT (By licensed PT), OT (By licensed OT)     PT Frequency: 5x week OT Frequency: 5x week            Contractures Contractures Info: Present(Bilateral LLE)    Additional Factors Info  Code Status, Allergies Code Status Info: Full code Allergies Info: Shellfish allergy;Sulfa antibotics           Current Medications (10/19/2019):  This is the current hospital active medication list Current Facility-Administered Medications  Medication Dose Route Frequency Provider Last Rate Last Admin  . 0.9 %  sodium chloride infusion   Intravenous PRN Cherylann Ratel A, DO   Stopped at 10/19/19 0136  . acetaminophen (TYLENOL) tablet 650 mg  650 mg Oral Q6H PRN Marylyn Ishihara, Tyrone A, DO   650 mg at 10/15/19 2010   Or  . acetaminophen (TYLENOL) suppository 650 mg  650 mg Rectal Q6H PRN Marylyn Ishihara, Tyrone A, DO      . albuterol (PROVENTIL) (2.5 MG/3ML) 0.083% nebulizer solution 2.5 mg  2.5 mg Nebulization Q6H PRN Marylyn Ishihara, Tyrone A, DO      . ascorbic acid (VITAMIN C) 500 MG/5ML syrup 500 mg  500 mg Oral BID Kyle, Tyrone A, DO   500 mg at 10/19/19 1009  . cefTRIAXone (ROCEPHIN) 2 g in sodium  chloride 0.9 % 100 mL IVPB  2 g Intravenous Q24H Rai, Ripudeep K, MD 200 mL/hr at 10/19/19 1154 2 g at 10/19/19 1154  . Chlorhexidine Gluconate Cloth 2 % PADS 6 each  6 each Topical Daily Kyle, Tyrone A, DO   6 each at 10/19/19 1009  . collagenase (SANTYL) ointment   Topical Daily Marylyn Ishihara, Tyrone A, DO   Given at 10/19/19 1012  . enoxaparin (LOVENOX) injection 40 mg  40 mg Subcutaneous Q24H Kyle, Tyrone A, DO   40 mg at 10/19/19 1009  . feeding supplement (ENSURE ENLIVE) (ENSURE ENLIVE) liquid 237 mL  237 mL Oral TID BM Kyle, Tyrone A, DO   237 mL at 10/19/19 1009   . feeding supplement (PRO-STAT SUGAR FREE 64) liquid 30 mL  30 mL Oral TID BM Kyle, Tyrone A, DO   30 mL at 10/19/19 1009  . HYDROcodone-acetaminophen (NORCO/VICODIN) 5-325 MG per tablet 1-2 tablet  1-2 tablet Oral Q4H PRN Marylyn Ishihara, Tyrone A, DO      . metroNIDAZOLE (FLAGYL) IVPB 500 mg  500 mg Intravenous Q8H Kyle, Tyrone A, DO 100 mL/hr at 10/19/19 1014 500 mg at 10/19/19 1014  . morphine 2 MG/ML injection 1 mg  1 mg Intravenous Q4H PRN Marylyn Ishihara, Tyrone A, DO   1 mg at 10/18/19 2005  . multivitamins with iron tablet 1 tablet  1 tablet Oral Daily Kyle, Tyrone A, DO   1 tablet at 10/19/19 1012  . potassium chloride (KLOR-CON) packet 20 mEq  20 mEq Oral Daily Rai, Ripudeep K, MD   20 mEq at 10/19/19 1012  . sodium chloride flush (NS) 0.9 % injection 3 mL  3 mL Intravenous Q12H Kyle, Tyrone A, DO   3 mL at 10/18/19 2018  . venlafaxine (EFFEXOR) tablet 75 mg  75 mg Oral BID Marylyn Ishihara, Tyrone A, DO   75 mg at 10/19/19 1012  . zinc sulfate capsule 220 mg  220 mg Oral Daily Kyle, Tyrone A, DO   220 mg at 10/19/19 1014     Discharge Medications: Please see discharge summary for a list of discharge medications.  Relevant Imaging Results:  Relevant Lab Results:   Additional Information [id number]  Tikisha Molinaro, Juliann Pulse, RN

## 2019-10-19 NOTE — Progress Notes (Signed)
   10/19/19 1500 Hydrotherapy note [id number]     Subjective hands are lightly restrained  Patient and Family Stated Goals pt unable, no family present   Date of Onset  (present on admit)  Prior Treatments unsure (u)  Evaluation and Treatment  Evaluation and Treatment Procedures Explained to Patient/Family Yes  Evaluation and Treatment Procedures Patient unable to consent due to mental status  Pressure Injury 10/15/19 Sacrum Mid Unstageable - Full thickness tissue loss in which the base of the injury is covered by slough (yellow, tan, gray, green or brown) and/or eschar (tan, brown or black) in the wound bed. *PT* sacral pressure injury, bone   Date First Assessed/Time First Assessed: 10/15/19 1510   Location: Sacrum  Location Orientation: Mid  Staging: Unstageable - Full thickness tissue loss in which the base of the injury is covered by slough (yellow, tan, gray, green or brown) and/or esc...  Dressing Type Foam - Lift dressing to assess site every shift;Moist to dry;Other (Comment)  Dressing Changed  Dressing Change Frequency Daily  State of Healing Non-healing  Site / Wound Assessment Pink;Yellow;Friable  % Wound base Red or Granulating 40%  % Wound base Yellow/Fibrinous Exudate 60%  % Wound base Other/Granulation Tissue (Comment)  (bone palpable)  Peri-wound Assessment Excoriated;Pink;Erythema (non-blanchable)  Wound Length (cm) 3 cm  Wound Width (cm) 3.5 cm  Wound Depth (cm) 1.5 cm  Wound Surface Area (cm^2) 10.5 cm^2  Wound Volume (cm^3) 15.75 cm^3  Tunneling (cm) 4 (at 500)  Undermining (cm) 3 (periphery)  Margins Unattached edges (unapproximated)  Drainage Amount Moderate  Drainage Description Serosanguineous;Purulent  Treatment Cleansed;Hydrotherapy (Pulse lavage);Packing (Saline gauze)  Hydrotherapy  Pulsed lavage therapy - wound location sacrum  Pulsed Lavage with Suction (psi) 12 psi  Pulsed Lavage with Suction - Normal Saline Used 1000 mL  Pulsed Lavage Tip Tip  with splash shield  Wound Therapy - Assess/Plan/Recommendations  Wound Therapy - Clinical Statement Fecal contaminant in wound from earlier incident when pt. removed dressing. wound cleaned well and redressed. Decreased slough in wound. pt will benefit from hydrotherapy to facilitate wound healing and decr bioburden. Small wound adjacent to the larger  wound at gluteal cleft is continuous with larger wound.Continue PT for hydrotherapy.  Wound Therapy - Functional Problem List grossly bed bound/immobile  Factors Delaying/Impairing Wound Healing Immobility;Multiple medical problems;Infection - systemic/local  Hydrotherapy Plan Debridement;Dressing change;Patient/family education;Pulsatile lavage with suction  Wound Therapy - Frequency Other (comment)  Wound Therapy - Follow Up Recommendations Skilled nursing facility  Wound Plan continue hydrotherapy per orders M-Th for cleansing/debridement  Wound Therapy Goals - Improve the function of patient's integumentary system by progressing the wound(s) through the phases of wound healing by:  Decrease Necrotic Tissue to 70  Decrease Necrotic Tissue - Progress Progressing toward goal  Increase Granulation Tissue to 30  Increase Granulation Tissue - Progress Progressing toward goal  Improve Drainage Characteristics Mod  Improve Drainage Characteristics - Progress Progressing toward goal  Goals/treatment plan/discharge plan were made with and agreed upon by patient/family No, Patient unable to participate in goals/treatment/discharge plan and family unavailable  Time For Goal Achievement 2 weeks  Wound Therapy - Potential for Goals Poor  Tresa Endo PT Acute Rehabilitation Services Pager [phone] Office [phone]

## 2019-10-19 NOTE — Progress Notes (Signed)
[facility]                                                                              Patient Demographics  Lacey Cook, is a 64 y.o. female, DOB - 03/12/1956, ZDG:[id number]  Admit date - 10/15/2019   Admitting Physician Vianne Bulls, MD  Outpatient Primary MD for the patient is Richarda Osmond, DO  Outpatient specialists:   LOS - 4  days   Medical records reviewed and are as summarized below:    Chief Complaint  Patient presents with  . Skin Ulcer       Brief summary   Patient is a 64 year old female with dementia, hypertension, history of stroke, seizures, diabetes mellitus, chronic right hip pain, returned home from SNF.  History obtained from the daughter who reported that she was immobile and locked up at her knees and hips, it had been a struggle to keep her cleaned and attended to.  Patient's daughter became concerned after the sacral ulcer.  She felt that the patient was getting more weaker, worse mentation, appetite getting worse, hence brought to ED.. In ED, patient was noted to have multiple ulcerations, sepsis possibly from infected pressure ulcers.  ED Course: She was examined by ED stable. Multiple ulcerations noted. She was tachy, tachypneic with an elevated white count and lactic acidosis. She was started on sepsis protocol. [facility] was called for admission.    Assessment & Plan    Sepsis secondary to multiple pressure ulcers as listed below, infected pressure ulcer of sacrum and bilateral medial knees -Patient met sepsis criteria at the time of admission, with the lactic acidosis, tachycardia, tachypnea, leukocytosis - will de-escalate antibiotics, placed on IV Rocephin and Flagyl.  - DC IV cefepime and vancomycin.   -Continue hydrotherapy.  Wound care following   Asthma -Currently no wheezing, continue albuterol as needed  Hypokalemia Recheck BMP  Dehydration, poor p.o. intake, moderate protein calorie malnutrition -Continue  prostat, zinc, nutritional supplements for wound healing.   -PT OT consult  Alzheimer's dementia, depression -Continue Effexor, poor functional status, has been pocketing medications, concern for aspiration -Palliative medicine consulted for goals of care -Will place safety sitter and wrist restraints, interfering with medical treatment  Chronic hip pain, contractures, knee pain, contractures, severe debility -CT abdomen pelvis showed severe right hip osteoarthritis, diffuse lumbar and lower thoracic degenerative disc narrowing with lumbar facet arthritis, L4-L5 L5-S1  Anterolisthesis.  Pressure injury documentation below: All present on admission  Pressure Injury 07/21/19 Knee Left (Active)  07/21/19   Location: Knee  Location Orientation: Left  Staging:   Wound Description (Comments):   Present on Admission: Yes     Pressure Injury 07/21/19 Knee Right Unstageable - Full thickness tissue loss in which the base of the injury is covered by slough (yellow, tan, gray, green or brown) and/or eschar (tan, brown or black) in the wound bed. Dry stable eschar (Active)  07/21/19   Location: Knee  Location Orientation: Right  Staging: Unstageable - Full thickness tissue loss in which the base of the injury is covered by slough (yellow, tan, gray, green or brown) and/or eschar (tan, brown or  black) in the wound bed.  Wound Description (Comments): Dry stable eschar  Present on Admission: Yes     Pressure Injury 07/21/19 Heel Right serum filled blister (Active)  07/21/19   Location: Heel  Location Orientation: Right  Staging:   Wound Description (Comments): serum filled blister  Present on Admission: Yes     Pressure Injury 07/26/19 Buttocks Posterior;Mid Stage 1 -  Intact skin with non-blanchable redness of a localized area usually over a bony prominence. (Active)  07/26/19 2301  Location: Buttocks  Location Orientation: Posterior;Mid  Staging: Stage 1 -  Intact skin with non-blanchable  redness of a localized area usually over a bony prominence.  Wound Description (Comments):   Present on Admission:      Pressure Injury 10/15/19 Heel Left;Lateral Unstageable - Full thickness tissue loss in which the base of the injury is covered by slough (yellow, tan, gray, green or brown) and/or eschar (tan, brown or black) in the wound bed. (Active)  10/15/19 1234  Location: Heel  Location Orientation: Left;Lateral  Staging: Unstageable - Full thickness tissue loss in which the base of the injury is covered by slough (yellow, tan, gray, green or brown) and/or eschar (tan, brown or black) in the wound bed.  Wound Description (Comments):   Present on Admission: Yes     Pressure Injury 10/15/19 Heel Right;Lateral;Distal Deep Tissue Pressure Injury - Purple or maroon localized area of discolored intact skin or blood-filled blister due to damage of underlying soft tissue from pressure and/or shear. resolving (Active)  10/15/19 1235  Location: Heel  Location Orientation: Right;Lateral;Distal  Staging: Deep Tissue Pressure Injury - Purple or maroon localized area of discolored intact skin or blood-filled blister due to damage of underlying soft tissue from pressure and/or shear.  Wound Description (Comments): resolving  Present on Admission: Yes     Pressure Injury 10/15/19 Heel Posterior;Right;Lateral Deep Tissue Pressure Injury - Purple or maroon localized area of discolored intact skin or blood-filled blister due to damage of underlying soft tissue from pressure and/or shear. resolving (Active)  10/15/19 1235  Location: Heel  Location Orientation: Posterior;Right;Lateral  Staging: Deep Tissue Pressure Injury - Purple or maroon localized area of discolored intact skin or blood-filled blister due to damage of underlying soft tissue from pressure and/or shear.  Wound Description (Comments): resolving  Present on Admission: Yes     Pressure Injury 10/15/19 Knee Left;Medial Deep Tissue  Pressure Injury - Purple or maroon localized area of discolored intact skin or blood-filled blister due to damage of underlying soft tissue from pressure and/or shear. (Active)  10/15/19 1236  Location: Knee  Location Orientation: Left;Medial  Staging: Deep Tissue Pressure Injury - Purple or maroon localized area of discolored intact skin or blood-filled blister due to damage of underlying soft tissue from pressure and/or shear.  Wound Description (Comments):   Present on Admission: Yes     Pressure Injury 10/15/19 Knee Right;Medial Deep Tissue Pressure Injury - Purple or maroon localized area of discolored intact skin or blood-filled blister due to damage of underlying soft tissue from pressure and/or shear. (Active)  10/15/19 1236  Location: Knee  Location Orientation: Right;Medial  Staging: Deep Tissue Pressure Injury - Purple or maroon localized area of discolored intact skin or blood-filled blister due to damage of underlying soft tissue from pressure and/or shear.  Wound Description (Comments):   Present on Admission: Yes     Pressure Injury 10/15/19 Pretibial Left;Distal;Medial Deep Tissue Pressure Injury - Purple or maroon localized area of discolored intact skin  or blood-filled blister due to damage of underlying soft tissue from pressure and/or shear. (Active)  10/15/19 1237  Location: Pretibial  Location Orientation: Left;Distal;Medial  Staging: Deep Tissue Pressure Injury - Purple or maroon localized area of discolored intact skin or blood-filled blister due to damage of underlying soft tissue from pressure and/or shear.  Wound Description (Comments):   Present on Admission: Yes     Pressure Injury 10/15/19 Sacrum Medial Stage 4 - Full thickness tissue loss with exposed bone, tendon or muscle. (Active)  10/15/19 1237  Location: Sacrum  Location Orientation: Medial  Staging: Stage 4 - Full thickness tissue loss with exposed bone, tendon or muscle.  Wound Description  (Comments):   Present on Admission: Yes     Pressure Injury 10/15/19 Buttocks Left;Upper Unstageable - Full thickness tissue loss in which the base of the injury is covered by slough (yellow, tan, gray, green or brown) and/or eschar (tan, brown or black) in the wound bed. (Active)  10/15/19 1238  Location: Buttocks  Location Orientation: Left;Upper  Staging: Unstageable - Full thickness tissue loss in which the base of the injury is covered by slough (yellow, tan, gray, green or brown) and/or eschar (tan, brown or black) in the wound bed.  Wound Description (Comments):   Present on Admission: Yes     Pressure Injury 10/15/19 Ischial tuberosity Left;Distal Stage 2 -  Partial thickness loss of dermis presenting as a shallow open injury with a red, pink wound bed without slough. (Active)  10/15/19 1238  Location: Ischial tuberosity  Location Orientation: Left;Distal  Staging: Stage 2 -  Partial thickness loss of dermis presenting as a shallow open injury with a red, pink wound bed without slough.  Wound Description (Comments):   Present on Admission: Yes     Pressure Injury 10/15/19 Sacrum Mid Unstageable - Full thickness tissue loss in which the base of the injury is covered by slough (yellow, tan, gray, green or brown) and/or eschar (tan, brown or black) in the wound bed. *PT* sacral pressure injury, bone  (Active)  10/15/19 1510  Location: Sacrum  Location Orientation: Mid  Staging: Unstageable - Full thickness tissue loss in which the base of the injury is covered by slough (yellow, tan, gray, green or brown) and/or eschar (tan, brown or black) in the wound bed.  Wound Description (Comments): *PT* sacral pressure injury, bone palpable, not able to visualize base  Present on Admission: Yes    Obesity with protein calorie malnutrition Estimated body mass index is 36.94 kg/m as calculated from the following:   Height as of this encounter: 5' 1"  (1.549 m).   Weight as of this encounter:  88.7 kg.  Code Status: Full CODE STATUS DVT Prophylaxis:  Lovenox  Family Communication: No family at bedside   Disposition Plan:     Status is: Inpatient  Remains inpatient appropriate because:Inpatient level of care appropriate due to severity of illness, dementia, multiple pressure sores, infected, sepsis, on IV antibiotics.  Will need skilled nursing facility for close monitoring, wound care and safety   Dispo: The patient is from: Home              Anticipated d/c is to: SNF              Anticipated d/c date is: > 3 days              Patient currently is not medically stable to d/c.       Time Spent in minutes 35 minutes  Procedures:  None  Consultants:   Wound care Palliative medicine  Antimicrobials:   Anti-infectives (From admission, onward)   Start     Dose/Rate Route Frequency Ordered Stop   10/19/19 1100  cefTRIAXone (ROCEPHIN) 2 g in sodium chloride 0.9 % 100 mL IVPB     2 g 200 mL/hr over 30 Minutes Intravenous Every 24 hours 10/19/19 0956     10/16/19 0400  vancomycin (VANCOCIN) IVPB 1000 mg/200 mL premix  Status:  Discontinued     1,000 mg 200 mL/hr over 60 Minutes Intravenous Every 24 hours 10/15/19 1049 10/19/19 0955   10/15/19 1200  metroNIDAZOLE (FLAGYL) IVPB 500 mg     500 mg 100 mL/hr over 60 Minutes Intravenous Every 8 hours 10/15/19 1119     10/15/19 1200  ceFEPIme (MAXIPIME) 2 g in sodium chloride 0.9 % 100 mL IVPB  Status:  Discontinued     2 g 200 mL/hr over 30 Minutes Intravenous Every 8 hours 10/15/19 1037 10/19/19 0955   10/15/19 0315  ceFEPIme (MAXIPIME) 2 g in sodium chloride 0.9 % 100 mL IVPB     2 g 200 mL/hr over 30 Minutes Intravenous  Once 10/15/19 0305 10/15/19 0534   10/15/19 0315  metroNIDAZOLE (FLAGYL) IVPB 500 mg     500 mg 100 mL/hr over 60 Minutes Intravenous  Once 10/15/19 0305 10/15/19 0533   10/15/19 0315  vancomycin (VANCOCIN) IVPB 1000 mg/200 mL premix  Status:  Discontinued     1,000 mg 200 mL/hr over 60 Minutes  Intravenous  Once 10/15/19 0305 10/15/19 0309   10/15/19 0315  vancomycin (VANCOREADY) IVPB 1500 mg/300 mL     1,500 mg 150 mL/hr over 120 Minutes Intravenous  Once 10/15/19 0310 10/15/19 0715         Medications  Scheduled Meds: . ascorbic acid  500 mg Oral BID  . Chlorhexidine Gluconate Cloth  6 each Topical Daily  . collagenase   Topical Daily  . enoxaparin (LOVENOX) injection  40 mg Subcutaneous Q24H  . feeding supplement (ENSURE ENLIVE)  237 mL Oral TID BM  . feeding supplement (PRO-STAT SUGAR FREE 64)  30 mL Oral TID BM  . multivitamins with iron  1 tablet Oral Daily  . potassium chloride  20 mEq Oral Daily  . sodium chloride flush  3 mL Intravenous Q12H  . venlafaxine  75 mg Oral BID  . zinc sulfate  220 mg Oral Daily   Continuous Infusions: . sodium chloride Stopped (10/19/19 0136)  . cefTRIAXone (ROCEPHIN)  IV 2 g (10/19/19 1154)  . metronidazole 500 mg (10/19/19 1014)   PRN Meds:.sodium chloride, acetaminophen **OR** acetaminophen, albuterol, HYDROcodone-acetaminophen, morphine injection      Subjective:   Lacey Cook was seen and examined today.  Alert awake however not verbal.  Has severe dementia, unable to obtain review of system from the patient.  Comfortable.  No fevers   Objective:   Vitals:   10/18/19 2155 10/18/19 2200 10/18/19 2309 10/19/19 0602  BP:  (!) 133/59 109/65 114/74  Pulse: (!) 120 (!) 131 (!) 125 (!) 108  Resp: (!) 25 (!) 27 18 18   Temp:   98.1 F (36.7 C) 99.3 F (37.4 C)  TempSrc:   Oral Oral  SpO2: 94% 97% 96% 94%  Weight:      Height:        Intake/Output Summary (Last 24 hours) at 10/19/2019 1248 Last data filed at 10/19/2019 0604 Gross per 24 hour  Intake 755.29 ml  Output 550  ml  Net 205.29 ml     Wt Readings from Last 3 Encounters:  10/15/19 88.7 kg  07/27/19 84 kg  11/24/18 88.3 kg   Physical Exam  General: Alert and awake, dementia  Cardiovascular: S1 S2 clear, RRR. No pedal edema b/l  Respiratory:  CTAB, no wheezing, rales or rhonchi  Gastrointestinal: Soft, nontender, nondistended, NBS  Ext: no pedal edema bilaterally  Neuro: Does not follow verbal commands  Musculoskeletal: No cyanosis, clubbing  Skin: Multiple pressure sores  Psych: Dementia   Data Reviewed:  I have personally reviewed following labs and imaging studies  Micro Results Recent Results (from the past 240 hour(s))  Blood Culture (routine x 2)     Status: None (Preliminary result)   Collection Time: 10/15/19  1:44 AM   Specimen: BLOOD  Result Value Ref Range Status   Specimen Description   Final    BLOOD RIGHT ANTECUBITAL Performed at [facility] [address], [place], [place] [address]    Special Requests   Final    BOTTLES DRAWN AEROBIC AND ANAEROBIC Blood Culture adequate volume Performed at [facility] [address], [place], [place] [address]    Culture   Final    NO GROWTH 3 DAYS Performed at [facility], [place] [address] [place], [place] [address]    Report Status PENDING  Incomplete  Blood Culture (routine x 2)     Status: None (Preliminary result)   Collection Time: 10/15/19  1:49 AM   Specimen: BLOOD  Result Value Ref Range Status   Specimen Description   Final    BLOOD BLOOD LEFT HAND Performed at [facility] [address] [place], [place] [address]    Special Requests   Final    BOTTLES DRAWN AEROBIC ONLY Blood Culture results may not be optimal due to an inadequate volume of blood received in culture bottles Performed at [facility] [address] [place], [place] [address]    Culture   Final    NO GROWTH 3 DAYS Performed at [facility], [place] [address], [place],  [address]    Report Status PENDING  Incomplete  SARS Coronavirus 2 by RT PCR (hospital order, performed in [facility] hospital lab) Nasopharyngeal Nasopharyngeal Swab     Status: None   Collection Time: 10/15/19   5:30 AM   Specimen: Nasopharyngeal Swab  Result Value Ref Range Status   SARS Coronavirus 2 NEGATIVE NEGATIVE Final    Comment: (NOTE) SARS-CoV-2 target nucleic acids are NOT DETECTED. The SARS-CoV-2 RNA is generally detectable in upper and lower respiratory specimens during the acute phase of infection. The lowest concentration of SARS-CoV-2 viral copies this assay can detect is 250 copies / mL. A negative result does not preclude SARS-CoV-2 infection and should not be used as the sole basis for treatment or other patient management decisions.  A negative result may occur with improper specimen collection / handling, submission of specimen other than nasopharyngeal swab, presence of viral mutation(s) within the areas targeted by this assay, and inadequate number of viral copies (<250 copies / mL). A negative result must be combined with clinical observations, patient history, and epidemiological information. Fact Sheet for Patients:   StrictlyIdeas.no Fact Sheet for Healthcare Providers: BankingDealers.co.za This test is not yet approved or cleared  by the [place] FDA and has been authorized for detection and/or diagnosis of SARS-CoV-2 by FDA under an Emergency Use Authorization (EUA).  This EUA will remain in effect (meaning this test can be used) for the duration of the COVID-19 declaration under Section 564(b)(1) of the Act, 21 U.S.C. section 360bbb-3(b)(1), unless the authorization is terminated or revoked sooner. Performed at [facility], [place] [address] [place], [place] [address]   Urine culture     Status: None   Collection Time: 10/15/19  6:16 AM   Specimen: In/Out Cath Urine  Result Value Ref Range Status   Specimen Description   Final    IN/OUT CATH URINE Performed at [facility] [address], [place], [place] [address]    Special Requests   Final    NONE Performed at  [facility], [place] [address] [place], [place] [address]    Culture   Final    NO GROWTH Performed at [facility], [place] [address] [place], [place] [address]    Report Status 10/16/2019 FINAL  Final  MRSA PCR Screening     Status: None   Collection Time: 10/15/19 10:34 AM   Specimen: Nasal Mucosa; Nasopharyngeal  Result Value Ref Range Status   MRSA by PCR NEGATIVE NEGATIVE Final    Comment:        The GeneXpert MRSA Assay (FDA approved for NASAL specimens only), is one component of a comprehensive MRSA colonization surveillance program. It is not intended to diagnose MRSA infection nor to guide or monitor treatment for MRSA infections. Performed at [facility], [place] [address], [place], [place] [address]     Radiology Reports CT ABDOMEN PELVIS W CONTRAST  Result Date: 10/15/2019 CLINICAL DATA:  Assess extent of sacral wound EXAM: CT ABDOMEN AND PELVIS WITH CONTRAST TECHNIQUE: Multidetector CT imaging of the abdomen and pelvis was performed using the standard protocol following bolus administration of intravenous contrast. CONTRAST:  149m OMNIPAQUE IOHEXOL 300 MG/ML  SOLN COMPARISON:  None. FINDINGS: Lower chest:  No contributory findings. Hepatobiliary: No focal liver abnormality.No evidence of biliary obstruction or stone. Pancreas: Unremarkable pancreas. Small duodenal diverticulum at the pancreatic head. Spleen: Unremarkable. Adrenals/Urinary Tract: Negative adrenals. No hydronephrosis or stone. Unremarkable bladder. Stomach/Bowel:  No obstruction. No appendicitis. Vascular/Lymphatic: No acute vascular abnormality. No mass or adenopathy. Reproductive:Unremarkable for age Other: No ascites or pneumoperitoneum. Musculoskeletal: Sacral decubitus ulcer is seen over the lower sacrum with tracking leftward in the subcutaneous fat by 2 cm. No collection or bony erosion seen. Severe right hip osteoarthritis. Diffuse lumbar and lower thoracic  degenerative disc narrowing with lumbar facet arthritis and L4-5, L5-S1 anterolisthesis. IMPRESSION: 1. Lower sacral decubitus ulcer with leftward subcutaneous tracking by 2 cm. No collection or visible osteomyelitis. 2. Advanced right hip and lumbar spine degeneration. Electronically Signed   By: JMonte FantasiaM.D.   On: 10/15/2019 04:49   DG Chest Port 1 View  Result Date: 10/15/2019 CLINICAL DATA:  Tachycardia EXAM: PORTABLE CHEST 1 VIEW COMPARISON:  07/20/2018 FINDINGS: The patient's chin obscures the apices. Hazy left lung opacity possibly due to rotation and soft tissue. No consolidation, pleural effusion or pneumothorax. IMPRESSION: Mild asymmetric hazy left thoracic opacity, suspected to be secondary to rotation and overlying soft tissues. No consolidative airspace disease. Electronically Signed   By: KDonavan FoilM.D.   On: 10/15/2019 02:39    Lab Data:  CBC: Recent Labs  Lab 10/15/19 0144 10/15/19 0257 10/16/19 0231  WBC 12.0*  --  8.5  NEUTROABS 10.0*  --   --   HGB 15.6* 18.7* 11.9*  HCT 48.9*  55.0* 37.8  MCV 96.6  --  97.4  PLT 302  --  833   Basic Metabolic Panel: Recent Labs  Lab 10/15/19 0144 10/15/19 0257 10/16/19 0231 10/16/19 1224 10/17/19 0519  NA 143 142 144  --  141  K 3.5 3.5 2.5* 2.9* 3.2*  CL 102 103 118*  --  113*  CO2 28  --  22  --  21*  GLUCOSE 141* 132* 99  --  114*  BUN 18 17 16   --  12  CREATININE 0.50 0.40* <0.30*  --  0.30*  CALCIUM 8.8*  --  7.9*  --  7.7*  MG  --   --  1.9  --   --    GFR: Estimated Creatinine Clearance: 73.9 mL/min (A) (by C-G formula based on SCr of 0.3 mg/dL (L)). Liver Function Tests: Recent Labs  Lab 10/15/19 0144 10/16/19 0231 10/17/19 0519  AST 30 21 18   ALT 21 18 15   ALKPHOS 97 64 59  BILITOT 1.6* 0.6 0.8  PROT 7.3 5.0* 5.1*  ALBUMIN 3.0* 2.0* 2.0*   No results for input(s): LIPASE, AMYLASE in the last 168 hours. No results for input(s): AMMONIA in the last 168 hours. Coagulation Profile: Recent  Labs  Lab 10/15/19 0144 10/16/19 0231  INR 1.2 1.3*   Cardiac Enzymes: No results for input(s): CKTOTAL, CKMB, CKMBINDEX, TROPONINI in the last 168 hours. BNP (last 3 results) No results for input(s): PROBNP in the last 8760 hours. HbA1C: No results for input(s): HGBA1C in the last 72 hours. CBG: Recent Labs  Lab 10/16/19 0757 10/16/19 1207  GLUCAP 136* 133*   Lipid Profile: No results for input(s): CHOL, HDL, LDLCALC, TRIG, CHOLHDL, LDLDIRECT in the last 72 hours. Thyroid Function Tests: No results for input(s): TSH, T4TOTAL, FREET4, T3FREE, THYROIDAB in the last 72 hours. Anemia Panel: No results for input(s): VITAMINB12, FOLATE, FERRITIN, TIBC, IRON, RETICCTPCT in the last 72 hours. Urine analysis:    Component Value Date/Time   COLORURINE YELLOW 10/15/2019 0616   APPEARANCEUR CLEAR 10/15/2019 0616   LABSPEC >1.046 (H) 10/15/2019 0616   PHURINE 8.0 10/15/2019 0616   GLUCOSEU NEGATIVE 10/15/2019 0616   HGBUR NEGATIVE 10/15/2019 0616   BILIRUBINUR NEGATIVE 10/15/2019 0616   KETONESUR 5 (A) 10/15/2019 0616   PROTEINUR NEGATIVE 10/15/2019 0616   NITRITE POSITIVE (A) 10/15/2019 0616   LEUKOCYTESUR SMALL (A) 10/15/2019 5825     Akoni Parton M.D. [facility] 10/19/2019, 12:48 PM   Call night coverage person covering after 7pm

## 2019-10-19 NOTE — Progress Notes (Signed)
Pt was found eating feces twice so far this morning. She was also eating the santyl covered gauze from her sacrum dressing. Dr. Tana Coast and PT notified(before hydrotherapy session). Safety sitter ordered.

## 2019-10-19 NOTE — TOC Initial Note (Signed)
Transition of Care [facility]) - Initial/Assessment Note    Patient Details  Name: Tonisha Silvey MRN: [id number] Date of Birth: 1955-08-31  [facility]) CM/SW Contact:    Dessa Phi, RN Phone Number: 10/19/2019, 3:56 PM  Clinical Narrative: patient w/dementia-dtr Faythe Dingwall agree to SNF-Faxed out-has gone to [facility] in past-preferred-await bed offers.                  Expected Discharge Plan: Skilled Nursing Facility Barriers to Discharge: Continued Medical Work up   Patient Goals and CMS Choice Patient states their goals for this hospitalization and ongoing recovery are:: go to rehab CMS Medicare.gov Compare Post Acute Care list provided to:: Patient Represenative (must comment)(dtr ronda)    Expected Discharge Plan and Services Expected Discharge Plan: [place]   Discharge Planning Services: CM Consult Post Acute Care Choice: Resumption of Svcs/PTA Provider Living arrangements for the past 2 months: Single Family Home                                      Prior Living Arrangements/Services Living arrangements for the past 2 months: Single Family Home Lives with:: Adult Children Patient language and need for interpreter reviewed:: Yes Do you feel safe going back to the place where you live?: Yes      Need for Family Participation in Patient Care: No (Comment) Care giver support system in place?: Yes (comment) Current home services: DME Criminal Activity/Legal Involvement Pertinent to Current Situation/Hospitalization: No - Comment as needed  Activities of Daily Living Home Assistive Devices/Equipment: Gilford Rile (specify type), Hospital bed(4 wheeled walker) ADL Screening (condition at time of admission) Patient's cognitive ability adequate to safely complete daily activities?: No Is the patient deaf or have difficulty hearing?: Yes(slight hoh) Does the patient have difficulty seeing, even when wearing glasses/contacts?: No Does the patient  have difficulty concentrating, remembering, or making decisions?: Yes Patient able to express need for assistance with ADLs?: No Does the patient have difficulty dressing or bathing?: Yes Independently performs ADLs?: No Communication: Independent Dressing (OT): Dependent Is this a change from baseline?: Pre-admission baseline Grooming: Dependent Is this a change from baseline?: Pre-admission baseline Feeding: Dependent Is this a change from baseline?: Pre-admission baseline Bathing: Dependent Is this a change from baseline?: Pre-admission baseline Toileting: Dependent Is this a change from baseline?: Pre-admission baseline In/Out Bed: Dependent Is this a change from baseline?: Pre-admission baseline Walks in Home: Dependent Is this a change from baseline?: Pre-admission baseline Does the patient have difficulty walking or climbing stairs?: Yes(secondary to weakness) Weakness of Legs: Both Weakness of Arms/Hands: None  Permission Sought/Granted Permission sought to share information with : Case Manager Permission granted to share information with : Yes, Verbal Permission Granted  Share Information with NAME: Case manager     Permission granted to share info w Relationship: [name] [phone]     Emotional Assessment Appearance:: Appears stated age Attitude/Demeanor/Rapport: Gracious Affect (typically observed): Accepting Orientation: : Oriented to Self Alcohol / Substance Use: Not Applicable Psych Involvement: No (comment)  Admission diagnosis:  Infected pressure ulcer [L89.90, L08.9] Non-healing ulcer of multiple sites, limited to breakdown of skin (HCC) [L98.491] Pressure ulcer of sacral region, stage 4 ([place]) [L89.154] Sepsis without acute organ dysfunction, due to unspecified organism [facility]) [A41.9] Patient Active Problem List   Diagnosis Date Noted  . Palliative care encounter   . Infected pressure ulcer 10/15/2019  . Sepsis (  [place]) 10/15/2019  . Lactic acidosis  10/15/2019  . Inadequate oral intake 10/15/2019  . Hip pain, bilateral 10/15/2019  . Bilateral knee pain 10/15/2019  . Pressure injury of skin 07/27/2019  . Cellulitis of knee 07/21/2019  . Effusion of right knee 07/21/2019  . Alzheimer's dementia ([place]) 07/21/2019  . Elevated LFTs 07/21/2019  . Encounter for medication review 12/17/2018  . Arthritis 11/30/2018  . Intertriginous dermatitis associated with moisture 11/30/2018  . Asthma 11/30/2018  . Depression, recurrent ([facility]) 11/30/2018  . Adjustment disorder with disturbance of emotion 12/07/2017   PCP:  Richarda Osmond, DO Pharmacy:   [facility] [place], [place] [facility] [address] Phone: [phone] Fax: [phone]     Social Determinants of Health (SDOH) Interventions    Readmission Risk Interventions No flowsheet data found.

## 2019-10-20 LAB — CULTURE, BLOOD (ROUTINE X 2)
Culture: NO GROWTH
Culture: NO GROWTH
Special Requests: ADEQUATE

## 2019-10-20 LAB — BASIC METABOLIC PANEL
Anion gap: 6 (ref 5–15)
BUN: 7 mg/dL — ABNORMAL LOW (ref 8–23)
CO2: 23 mmol/L (ref 22–32)
Calcium: 7.3 mg/dL — ABNORMAL LOW (ref 8.9–10.3)
Chloride: 105 mmol/L (ref 98–111)
Creatinine, Ser: <0.3 mg/dL — ABNORMAL LOW (ref 0.44–1.00)
Glucose, Bld: 93 mg/dL (ref 70–99)
Potassium: 2.9 mmol/L — ABNORMAL LOW (ref 3.5–5.1)
Sodium: 134 mmol/L — ABNORMAL LOW (ref 135–145)

## 2019-10-20 LAB — CBC
HCT: 33.2 % — ABNORMAL LOW (ref 36.0–46.0)
Hemoglobin: 11.1 g/dL — ABNORMAL LOW (ref 12.0–15.0)
MCH: 31 pg (ref 26.0–34.0)
MCHC: 33.4 g/dL (ref 30.0–36.0)
MCV: 92.7 fL (ref 80.0–100.0)
Platelets: 263 10*3/uL (ref 150–400)
RBC: 3.58 MIL/uL — ABNORMAL LOW (ref 3.87–5.11)
RDW: 14.6 % (ref 11.5–15.5)
WBC: 8.1 10*3/uL (ref 4.0–10.5)
nRBC: 0 % (ref 0.0–0.2)

## 2019-10-20 LAB — MAGNESIUM: Magnesium: 1.7 mg/dL (ref 1.7–2.4)

## 2019-10-20 MED ORDER — POTASSIUM CHLORIDE 10 MEQ/100ML IV SOLN
10.0000 meq | INTRAVENOUS | Status: AC
  Administered 2019-10-20 (×2): 10 meq via INTRAVENOUS
  Filled 2019-10-20: qty 100

## 2019-10-20 MED ORDER — CALCIUM GLUCONATE-NACL 1-0.675 GM/50ML-% IV SOLN
1.0000 g | Freq: Once | INTRAVENOUS | Status: AC
  Administered 2019-10-20: 1000 mg via INTRAVENOUS
  Filled 2019-10-20: qty 50

## 2019-10-20 MED ORDER — POTASSIUM CHLORIDE 10 MEQ/100ML IV SOLN
10.0000 meq | Freq: Once | INTRAVENOUS | Status: AC
  Administered 2019-10-20: 10 meq via INTRAVENOUS
  Filled 2019-10-20: qty 100

## 2019-10-20 NOTE — Progress Notes (Signed)
PT reported a round pink area with 2 small blisters on patient's upper left back.  Dr. Tana Coast notified; came to floor to assess.  Per Dr. Tana Coast if blisters spread in a dermatome pattern, nursing should contact infection prevention.

## 2019-10-20 NOTE — TOC Progression Note (Signed)
Transition of Care [facility]) - Progression Note    Patient Details  Name: Lacey Cook MRN: [id number] Date of Birth: 1955-12-20  Transition of Care [facility]) CM/SW Contact  Ramondo Dietze, Juliann Pulse, RN Phone Number: 10/20/2019, 11:19 AM  Clinical Narrative: Bed offers given-dtr Faythe Dingwall chose [facility]. Await medical stability.      Expected Discharge Plan: [place] Barriers to Discharge: Continued Medical Work up  Expected Discharge Plan and Services Expected Discharge Plan: [place]   Discharge Planning Services: CM Consult Post Acute Care Choice: Resumption of Svcs/PTA Provider Living arrangements for the past 2 months: Single Family Home                                       Social Determinants of Health (SDOH) Interventions    Readmission Risk Interventions No flowsheet data found.

## 2019-10-20 NOTE — Progress Notes (Addendum)
[facility]                                                                              Patient Demographics  Lacey Cook, is a 64 y.o. female, DOB - 1955/11/18, BDZ:[id number]  Admit date - 10/15/2019   Admitting Physician Vianne Bulls, MD  Outpatient Primary MD for the patient is Richarda Osmond, DO  Outpatient specialists:   LOS - 5  days   Medical records reviewed and are as summarized below:    Chief Complaint  Patient presents with  . Skin Ulcer       Brief summary   Patient is a 64 year old female with dementia, hypertension, history of stroke, seizures, diabetes mellitus, chronic right hip pain, returned home from SNF.  History obtained from the daughter who reported that she was immobile and locked up at her knees and hips, it had been a struggle to keep her cleaned and attended to.  Patient's daughter became concerned after the sacral ulcer.  She felt that the patient was getting more weaker, worse mentation, appetite getting worse, hence brought to ED.. In ED, patient was noted to have multiple ulcerations, sepsis possibly from infected pressure ulcers.  ED Course: She was examined by ED stable. Multiple ulcerations noted. She was tachy, tachypneic with an elevated white count and lactic acidosis. She was started on sepsis protocol. [facility] was called for admission.    Assessment & Plan    Sepsis secondary to multiple pressure ulcers as listed below, infected pressure ulcer of sacrum and bilateral medial knees -Patient met sepsis criteria at the time of admission, with the lactic acidosis, tachycardia, tachypnea, leukocytosis -Antibiotics escalated, continue IV Rocephin and Flagyl.  Receiving hydrotherapy by wound care, patient will need skilled nursing facility for good wound care and monitoring -Foley catheter was placed due to infected pressure ulcers and wound soiling. Discussed with daughter, requested SNF for wound care.  -Currently in  restraints Addendum: Called by RN to assess if back has ?shingles lesion. Examined the back, one tiny blister with redness from linen, not consistent with shingles.  Recommended to monitor if any lesions in dermatomal fashion erupt.  -patient may benefit from air mattress    Asthma -No wheezing, continue albuterol as needed  Hypokalemia Replaced  Dehydration, poor p.o. intake, moderate protein calorie malnutrition -Continue prostat, zinc, nutritional supplements for wound healing.   -PT recommending skilled nursing facility  Alzheimer's dementia, depression -Continue Effexor, poor functional status, has been pocketing medications, concern for aspiration -Palliative medicine consulted for goals of care -Continue safety sitter and wrist restraints due to interference with medical treatment.    Chronic hip pain, contractures, knee pain, contractures, severe debility -CT abdomen pelvis showed severe right hip osteoarthritis, diffuse lumbar and lower thoracic degenerative disc narrowing with lumbar facet arthritis, L4-L5 L5-S1  Anterolisthesis.  Pressure injury documentation below: All present on admission  Pressure Injury 07/21/19 Knee Left (Active)  07/21/19   Location: Knee  Location Orientation: Left  Staging:   Wound Description (Comments):   Present on Admission: Yes     Pressure Injury 07/21/19 Knee Right Unstageable - Full thickness tissue loss  in which the base of the injury is covered by slough (yellow, tan, gray, green or brown) and/or eschar (tan, brown or black) in the wound bed. Dry stable eschar (Active)  07/21/19   Location: Knee  Location Orientation: Right  Staging: Unstageable - Full thickness tissue loss in which the base of the injury is covered by slough (yellow, tan, gray, green or brown) and/or eschar (tan, brown or black) in the wound bed.  Wound Description (Comments): Dry stable eschar  Present on Admission: Yes     Pressure Injury 07/21/19 Heel Right  serum filled blister (Active)  07/21/19   Location: Heel  Location Orientation: Right  Staging:   Wound Description (Comments): serum filled blister  Present on Admission: Yes     Pressure Injury 07/26/19 Buttocks Posterior;Mid Stage 1 -  Intact skin with non-blanchable redness of a localized area usually over a bony prominence. (Active)  07/26/19 2301  Location: Buttocks  Location Orientation: Posterior;Mid  Staging: Stage 1 -  Intact skin with non-blanchable redness of a localized area usually over a bony prominence.  Wound Description (Comments):   Present on Admission:      Pressure Injury 10/15/19 Heel Left;Lateral Unstageable - Full thickness tissue loss in which the base of the injury is covered by slough (yellow, tan, gray, green or brown) and/or eschar (tan, brown or black) in the wound bed. (Active)  10/15/19 1234  Location: Heel  Location Orientation: Left;Lateral  Staging: Unstageable - Full thickness tissue loss in which the base of the injury is covered by slough (yellow, tan, gray, green or brown) and/or eschar (tan, brown or black) in the wound bed.  Wound Description (Comments):   Present on Admission: Yes     Pressure Injury 10/15/19 Heel Right;Lateral;Distal Deep Tissue Pressure Injury - Purple or maroon localized area of discolored intact skin or blood-filled blister due to damage of underlying soft tissue from pressure and/or shear. resolving (Active)  10/15/19 1235  Location: Heel  Location Orientation: Right;Lateral;Distal  Staging: Deep Tissue Pressure Injury - Purple or maroon localized area of discolored intact skin or blood-filled blister due to damage of underlying soft tissue from pressure and/or shear.  Wound Description (Comments): resolving  Present on Admission: Yes     Pressure Injury 10/15/19 Heel Posterior;Right;Lateral Deep Tissue Pressure Injury - Purple or maroon localized area of discolored intact skin or blood-filled blister due to damage of  underlying soft tissue from pressure and/or shear. resolving (Active)  10/15/19 1235  Location: Heel  Location Orientation: Posterior;Right;Lateral  Staging: Deep Tissue Pressure Injury - Purple or maroon localized area of discolored intact skin or blood-filled blister due to damage of underlying soft tissue from pressure and/or shear.  Wound Description (Comments): resolving  Present on Admission: Yes     Pressure Injury 10/15/19 Knee Left;Medial Deep Tissue Pressure Injury - Purple or maroon localized area of discolored intact skin or blood-filled blister due to damage of underlying soft tissue from pressure and/or shear. (Active)  10/15/19 1236  Location: Knee  Location Orientation: Left;Medial  Staging: Deep Tissue Pressure Injury - Purple or maroon localized area of discolored intact skin or blood-filled blister due to damage of underlying soft tissue from pressure and/or shear.  Wound Description (Comments):   Present on Admission: Yes     Pressure Injury 10/15/19 Knee Right;Medial Deep Tissue Pressure Injury - Purple or maroon localized area of discolored intact skin or blood-filled blister due to damage of underlying soft tissue from pressure and/or shear. (Active)  10/15/19  1236  Location: Knee  Location Orientation: Right;Medial  Staging: Deep Tissue Pressure Injury - Purple or maroon localized area of discolored intact skin or blood-filled blister due to damage of underlying soft tissue from pressure and/or shear.  Wound Description (Comments):   Present on Admission: Yes     Pressure Injury 10/15/19 Pretibial Left;Distal;Medial Deep Tissue Pressure Injury - Purple or maroon localized area of discolored intact skin or blood-filled blister due to damage of underlying soft tissue from pressure and/or shear. (Active)  10/15/19 1237  Location: Pretibial  Location Orientation: Left;Distal;Medial  Staging: Deep Tissue Pressure Injury - Purple or maroon localized area of discolored  intact skin or blood-filled blister due to damage of underlying soft tissue from pressure and/or shear.  Wound Description (Comments):   Present on Admission: Yes     Pressure Injury 10/15/19 Sacrum Medial Stage 4 - Full thickness tissue loss with exposed bone, tendon or muscle. (Active)  10/15/19 1237  Location: Sacrum  Location Orientation: Medial  Staging: Stage 4 - Full thickness tissue loss with exposed bone, tendon or muscle.  Wound Description (Comments):   Present on Admission: Yes     Pressure Injury 10/15/19 Buttocks Left;Upper Unstageable - Full thickness tissue loss in which the base of the injury is covered by slough (yellow, tan, gray, green or brown) and/or eschar (tan, brown or black) in the wound bed. (Active)  10/15/19 1238  Location: Buttocks  Location Orientation: Left;Upper  Staging: Unstageable - Full thickness tissue loss in which the base of the injury is covered by slough (yellow, tan, gray, green or brown) and/or eschar (tan, brown or black) in the wound bed.  Wound Description (Comments):   Present on Admission: Yes     Pressure Injury 10/15/19 Ischial tuberosity Left;Distal Stage 2 -  Partial thickness loss of dermis presenting as a shallow open injury with a red, pink wound bed without slough. (Active)  10/15/19 1238  Location: Ischial tuberosity  Location Orientation: Left;Distal  Staging: Stage 2 -  Partial thickness loss of dermis presenting as a shallow open injury with a red, pink wound bed without slough.  Wound Description (Comments):   Present on Admission: Yes     Pressure Injury 10/15/19 Sacrum Mid Unstageable - Full thickness tissue loss in which the base of the injury is covered by slough (yellow, tan, gray, green or brown) and/or eschar (tan, brown or black) in the wound bed. *PT* sacral pressure injury, bone  (Active)  10/15/19 1510  Location: Sacrum  Location Orientation: Mid  Staging: Unstageable - Full thickness tissue loss in which the  base of the injury is covered by slough (yellow, tan, gray, green or brown) and/or eschar (tan, brown or black) in the wound bed.  Wound Description (Comments): *PT* sacral pressure injury, bone palpable, not able to visualize base  Present on Admission: Yes    Obesity with protein calorie malnutrition Estimated body mass index is 36.94 kg/m as calculated from the following:   Height as of this encounter: 5' 1"  (1.549 m).   Weight as of this encounter: 88.7 kg.   Code Status: Full CODE STATUS DVT Prophylaxis:  Lovenox  Family Communication:  discussed with daughter, Robyne Matar on phone in detail    Disposition Plan:     Status is: Inpatient  Remains inpatient appropriate because:Inpatient level of care appropriate due to severity of illness, dementia, multiple pressure sores, infected, sepsis, on IV antibiotics.  Will need skilled nursing facility for close monitoring, wound care and safety,  possible in next 48 hours.  Patient is continuing hydrotherapy till Friday, possibly DC to SNF on Friday or Saturday to continue chemical debridement with Santyl at the SNF.  Dispo: The patient is from: Home              Anticipated d/c is to: SNF              Anticipated d/c date is: > 3 days              Patient currently is not medically stable to d/c.       Time Spent in minutes 35 minutes Procedures:  None  Consultants:   Wound care Palliative medicine  Antimicrobials:   Anti-infectives (From admission, onward)   Start     Dose/Rate Route Frequency Ordered Stop   10/19/19 1100  cefTRIAXone (ROCEPHIN) 2 g in sodium chloride 0.9 % 100 mL IVPB     2 g 200 mL/hr over 30 Minutes Intravenous Every 24 hours 10/19/19 0956     10/16/19 0400  vancomycin (VANCOCIN) IVPB 1000 mg/200 mL premix  Status:  Discontinued     1,000 mg 200 mL/hr over 60 Minutes Intravenous Every 24 hours 10/15/19 1049 10/19/19 0955   10/15/19 1200  metroNIDAZOLE (FLAGYL) IVPB 500 mg     500 mg 100 mL/hr  over 60 Minutes Intravenous Every 8 hours 10/15/19 1119     10/15/19 1200  ceFEPIme (MAXIPIME) 2 g in sodium chloride 0.9 % 100 mL IVPB  Status:  Discontinued     2 g 200 mL/hr over 30 Minutes Intravenous Every 8 hours 10/15/19 1037 10/19/19 0955   10/15/19 0315  ceFEPIme (MAXIPIME) 2 g in sodium chloride 0.9 % 100 mL IVPB     2 g 200 mL/hr over 30 Minutes Intravenous  Once 10/15/19 0305 10/15/19 0534   10/15/19 0315  metroNIDAZOLE (FLAGYL) IVPB 500 mg     500 mg 100 mL/hr over 60 Minutes Intravenous  Once 10/15/19 0305 10/15/19 0533   10/15/19 0315  vancomycin (VANCOCIN) IVPB 1000 mg/200 mL premix  Status:  Discontinued     1,000 mg 200 mL/hr over 60 Minutes Intravenous  Once 10/15/19 0305 10/15/19 0309   10/15/19 0315  vancomycin (VANCOREADY) IVPB 1500 mg/300 mL     1,500 mg 150 mL/hr over 120 Minutes Intravenous  Once 10/15/19 0310 10/15/19 0715         Medications  Scheduled Meds: . ascorbic acid  500 mg Oral BID  . Chlorhexidine Gluconate Cloth  6 each Topical Daily  . collagenase   Topical Daily  . enoxaparin (LOVENOX) injection  40 mg Subcutaneous Q24H  . feeding supplement (ENSURE ENLIVE)  237 mL Oral TID BM  . feeding supplement (PRO-STAT SUGAR FREE 64)  30 mL Oral TID BM  . multivitamins with iron  1 tablet Oral Daily  . potassium chloride  20 mEq Oral Daily  . sodium chloride flush  3 mL Intravenous Q12H  . venlafaxine  75 mg Oral BID  . zinc sulfate  220 mg Oral Daily   Continuous Infusions: . sodium chloride 10 mL/hr at 10/20/19 0600  . cefTRIAXone (ROCEPHIN)  IV Stopped (10/19/19 2037)  . metronidazole 500 mg (10/20/19 1006)   PRN Meds:.sodium chloride, acetaminophen **OR** acetaminophen, albuterol, HYDROcodone-acetaminophen, morphine injection      Subjective:   Jessalyn Chestnutt was seen and examined today.  Alert and awake, tracks with eyes, not responding to any verbal commands.  She has severe dementia.  Had to  place restraints for interference with  medical treatment.  Appears comfortable no fevers.     Objective:   Vitals:   10/18/19 2309 10/19/19 0602 10/19/19 1407 10/19/19 2018  BP: 109/65 114/74 119/63 126/64  Pulse: (!) 125 (!) 108 (!) 108 (!) 114  Resp: 18 18 20 20   Temp: 98.1 F (36.7 C) 99.3 F (37.4 C) 99 F (37.2 C) 100.2 F (37.9 C)  TempSrc: Oral Oral Oral Oral  SpO2: 96% 94% 92% 94%  Weight:      Height:        Intake/Output Summary (Last 24 hours) at 10/20/2019 1253 Last data filed at 10/20/2019 2248 Gross per 24 hour  Intake 695.43 ml  Output --  Net 695.43 ml     Wt Readings from Last 3 Encounters:  10/15/19 88.7 kg  07/27/19 84 kg  11/24/18 88.3 kg   Physical Exam  General: Alert and awake, dementia, nonverbal  Cardiovascular: S1 S2 clear, RRR.   Respiratory: CTAB, no wheezing, rales or rhonchi  Gastrointestinal: Soft, nontender, nondistended, NBS  Ext: Prevalon boots  Neuro: no new deficits  Musculoskeletal: No cyanosis, clubbing  Skin: Multiple pressure sores  Psych: Flat affect, dementia, nonverbal   Data Reviewed:  I have personally reviewed following labs and imaging studies  Micro Results Recent Results (from the past 240 hour(s))  Blood Culture (routine x 2)     Status: None (Preliminary result)   Collection Time: 10/15/19  1:44 AM   Specimen: BLOOD  Result Value Ref Range Status   Specimen Description   Final    BLOOD RIGHT ANTECUBITAL Performed at [facility] [address] [place], [place] [address]    Special Requests   Final    BOTTLES DRAWN AEROBIC AND ANAEROBIC Blood Culture adequate volume Performed at [facility] [address], [place], [place] [address]    Culture   Final    NO GROWTH 4 DAYS Performed at [facility], [place] [address], [place], [place] [address]    Report Status PENDING  Incomplete  Blood Culture (routine x 2)     Status: None (Preliminary result)   Collection Time: 10/15/19  1:49 AM    Specimen: BLOOD  Result Value Ref Range Status   Specimen Description   Final    BLOOD BLOOD LEFT HAND Performed at [facility] [address] [place], [place] [address]    Special Requests   Final    BOTTLES DRAWN AEROBIC ONLY Blood Culture results may not be optimal due to an inadequate volume of blood received in culture bottles Performed at [facility] [address], [place], [place] [address]    Culture   Final    NO GROWTH 4 DAYS Performed at [facility], [place] [address], [place], [place] [address]    Report Status PENDING  Incomplete  SARS Coronavirus 2 by RT PCR (hospital order, performed in [facility] hospital lab) Nasopharyngeal Nasopharyngeal Swab     Status: None   Collection Time: 10/15/19  5:30 AM   Specimen: Nasopharyngeal Swab  Result Value Ref Range Status   SARS Coronavirus 2 NEGATIVE NEGATIVE Final    Comment: (NOTE) SARS-CoV-2 target nucleic acids are NOT DETECTED. The SARS-CoV-2 RNA is generally detectable in upper and lower respiratory specimens during the acute phase of infection. The lowest concentration of SARS-CoV-2 viral copies this assay can detect is 250 copies / mL. A negative result does not preclude  SARS-CoV-2 infection and should not be used as the sole basis for treatment or other patient management decisions.  A negative result may occur with improper specimen collection / handling, submission of specimen other than nasopharyngeal swab, presence of viral mutation(s) within the areas targeted by this assay, and inadequate number of viral copies (<250 copies / mL). A negative result must be combined with clinical observations, patient history, and epidemiological information. Fact Sheet for Patients:   StrictlyIdeas.no Fact Sheet for Healthcare Providers: BankingDealers.co.za This test is not yet approved or cleared  by the [place] FDA and has  been authorized for detection and/or diagnosis of SARS-CoV-2 by FDA under an Emergency Use Authorization (EUA).  This EUA will remain in effect (meaning this test can be used) for the duration of the COVID-19 declaration under Section 564(b)(1) of the Act, 21 U.S.C. section 360bbb-3(b)(1), unless the authorization is terminated or revoked sooner. Performed at [facility], [place] [address], [place], [place] [address]   Urine culture     Status: None   Collection Time: 10/15/19  6:16 AM   Specimen: In/Out Cath Urine  Result Value Ref Range Status   Specimen Description   Final    IN/OUT CATH URINE Performed at [facility] [address], [place], [place] [address]    Special Requests   Final    NONE Performed at [facility], [place] [address], [place], [place] [address]    Culture   Final    NO GROWTH Performed at [facility], [place] [address], [place], [place] [address]    Report Status 10/16/2019 FINAL  Final  MRSA PCR Screening     Status: None   Collection Time: 10/15/19 10:34 AM   Specimen: Nasal Mucosa; Nasopharyngeal  Result Value Ref Range Status   MRSA by PCR NEGATIVE NEGATIVE Final    Comment:        The GeneXpert MRSA Assay (FDA approved for NASAL specimens only), is one component of a comprehensive MRSA colonization surveillance program. It is not intended to diagnose MRSA infection nor to guide or monitor treatment for MRSA infections. Performed at [facility], [place] [address], [place], [place] [address]     Radiology Reports CT ABDOMEN PELVIS W CONTRAST  Result Date: 10/15/2019 CLINICAL DATA:  Assess extent of sacral wound EXAM: CT ABDOMEN AND PELVIS WITH CONTRAST TECHNIQUE: Multidetector CT imaging of the abdomen and pelvis was performed using the standard protocol following bolus administration of intravenous contrast. CONTRAST:  159m OMNIPAQUE IOHEXOL 300 MG/ML  SOLN  COMPARISON:  None. FINDINGS: Lower chest:  No contributory findings. Hepatobiliary: No focal liver abnormality.No evidence of biliary obstruction or stone. Pancreas: Unremarkable pancreas. Small duodenal diverticulum at the pancreatic head. Spleen: Unremarkable. Adrenals/Urinary Tract: Negative adrenals. No hydronephrosis or stone. Unremarkable bladder. Stomach/Bowel:  No obstruction. No appendicitis. Vascular/Lymphatic: No acute vascular abnormality. No mass or adenopathy. Reproductive:Unremarkable for age Other: No ascites or pneumoperitoneum. Musculoskeletal: Sacral decubitus ulcer is seen over the lower sacrum with tracking leftward in the subcutaneous fat by 2 cm. No collection or bony erosion seen. Severe right hip osteoarthritis. Diffuse lumbar and lower thoracic degenerative disc narrowing with lumbar facet arthritis and L4-5, L5-S1 anterolisthesis. IMPRESSION: 1. Lower sacral decubitus ulcer with leftward subcutaneous tracking by 2 cm. No collection or visible osteomyelitis. 2. Advanced right hip and lumbar spine degeneration. Electronically Signed   By: JMonte FantasiaM.D.   On: 10/15/2019 04:49   [facility]  [facility]  Result Date: 10/15/2019 CLINICAL DATA:  Tachycardia EXAM: PORTABLE CHEST 1 VIEW COMPARISON:  07/20/2018 FINDINGS: The patient's chin obscures the apices. Hazy left lung opacity possibly due to rotation and soft tissue. No consolidation, pleural effusion or pneumothorax. IMPRESSION: Mild asymmetric hazy left thoracic opacity, suspected to be secondary to rotation and overlying soft tissues. No consolidative airspace disease. Electronically Signed   By: Donavan Foil M.D.   On: 10/15/2019 02:39    Lab Data:  CBC: Recent Labs  Lab 10/15/19 0144 10/15/19 0257 10/16/19 0231 10/20/19 0452  WBC 12.0*  --  8.5 8.1  NEUTROABS 10.0*  --   --   --   HGB 15.6* 18.7* 11.9* 11.1*  HCT 48.9* 55.0* 37.8 33.2*  MCV 96.6  --  97.4 92.7  PLT 302  --  221 622   Basic Metabolic  Panel: Recent Labs  Lab 10/15/19 0144 10/15/19 0144 10/15/19 0257 10/15/19 0257 10/16/19 0231 10/16/19 1224 10/17/19 0519 10/19/19 1509 10/20/19 0452 10/20/19 0718  NA 143   < > 142  --  144  --  141 136 134*  --   K 3.5   < > 3.5   < > 2.5* 2.9* 3.2* 3.1* 2.9*  --   CL 102   < > 103  --  118*  --  113* 105 105  --   CO2 28  --   --   --  22  --  21* 24 23  --   GLUCOSE 141*   < > 132*  --  99  --  114* 96 93  --   BUN 18   < > 17  --  16  --  12 11 7*  --   CREATININE 0.50   < > 0.40*  --  <0.30*  --  0.30* <0.30* <0.30*  --   CALCIUM 8.8*  --   --   --  7.9*  --  7.7* 7.6* 7.3*  --   MG  --   --   --   --  1.9  --   --   --   --  1.7   < > = values in this interval not displayed.   GFR: CrCl cannot be calculated (This lab value cannot be used to calculate CrCl because it is not a number: <0.30). Liver Function Tests: Recent Labs  Lab 10/15/19 0144 10/16/19 0231 10/17/19 0519  AST 30 21 18   ALT 21 18 15   ALKPHOS 97 64 59  BILITOT 1.6* 0.6 0.8  PROT 7.3 5.0* 5.1*  ALBUMIN 3.0* 2.0* 2.0*   No results for input(s): LIPASE, AMYLASE in the last 168 hours. No results for input(s): AMMONIA in the last 168 hours. Coagulation Profile: Recent Labs  Lab 10/15/19 0144 10/16/19 0231  INR 1.2 1.3*   Cardiac Enzymes: No results for input(s): CKTOTAL, CKMB, CKMBINDEX, TROPONINI in the last 168 hours. BNP (last 3 results) No results for input(s): PROBNP in the last 8760 hours. HbA1C: No results for input(s): HGBA1C in the last 72 hours. CBG: Recent Labs  Lab 10/16/19 0757 10/16/19 1207  GLUCAP 136* 133*   Lipid Profile: No results for input(s): CHOL, HDL, LDLCALC, TRIG, CHOLHDL, LDLDIRECT in the last 72 hours. Thyroid Function Tests: No results for input(s): TSH, T4TOTAL, FREET4, T3FREE, THYROIDAB in the last 72 hours. Anemia Panel: No results for input(s): VITAMINB12, FOLATE, FERRITIN, TIBC, IRON, RETICCTPCT in the last 72 hours. Urine analysis:    Component  Value Date/Time   COLORURINE YELLOW 10/15/2019 0616   APPEARANCEUR CLEAR 10/15/2019 0616   LABSPEC >1.046 (H) 10/15/2019 0616   PHURINE 8.0 10/15/2019 0616   GLUCOSEU NEGATIVE 10/15/2019 0616   HGBUR NEGATIVE 10/15/2019 0616   BILIRUBINUR NEGATIVE 10/15/2019 0616   KETONESUR 5 (A) 10/15/2019 0616   PROTEINUR NEGATIVE 10/15/2019 0616   NITRITE POSITIVE (A) 10/15/2019 0616   LEUKOCYTESUR SMALL (A) 10/15/2019 2524     Suann Klier M.D. [facility] 10/20/2019, 12:53 PM   Call night coverage person covering after 7pm

## 2019-10-20 NOTE — Progress Notes (Signed)
Daily Progress Note   Patient Name: Lacey Cook       Date: 10/20/2019 DOB: August 03, 1955  Age: 64 y.o. MRN#: [id number] Attending Physician: Mendel Corning, MD Primary Care Physician: Richarda Osmond, DO Admit Date: 10/15/2019  Reason for Consultation/Follow-up: Establishing goals of care  Subjective:  patient is awake, resting in bed.  Safety sitter at bedside.  She is requiring wrist restraints.  Briefly discussed with TRH MD about her.   PPS 30% Length of Stay: 5  Current Medications: Scheduled Meds:  . ascorbic acid  500 mg Oral BID  . Chlorhexidine Gluconate Cloth  6 each Topical Daily  . collagenase   Topical Daily  . enoxaparin (LOVENOX) injection  40 mg Subcutaneous Q24H  . feeding supplement (ENSURE ENLIVE)  237 mL Oral TID BM  . feeding supplement (PRO-STAT SUGAR FREE 64)  30 mL Oral TID BM  . multivitamins with iron  1 tablet Oral Daily  . potassium chloride  20 mEq Oral Daily  . sodium chloride flush  3 mL Intravenous Q12H  . venlafaxine  75 mg Oral BID  . zinc sulfate  220 mg Oral Daily    Continuous Infusions: . sodium chloride 10 mL/hr at 10/20/19 0600  . cefTRIAXone (ROCEPHIN)  IV Stopped (10/19/19 2037)  . metronidazole 500 mg (10/20/19 1006)    PRN Meds: sodium chloride, acetaminophen **OR** acetaminophen, albuterol, HYDROcodone-acetaminophen, morphine injection  Physical Exam         Appears frail and weak Resting in bed Patient has mid low sacrum ulcers and tissue loss. Patient has other skin changes throughout perineum.    Appears to have regular work of breathing Not in distress  Vital Signs: BP 126/64 (BP Location: Right Arm)   Pulse (!) 114   Temp 100.2 F (37.9 C) (Oral)   Resp 20   Ht 5\' 1"  (1.549 m)   Wt 88.7 kg   SpO2 94%   BMI  36.94 kg/m  SpO2: SpO2: 94 % O2 Device: O2 Device: Room Air O2 Flow Rate:    Intake/output summary:   Intake/Output Summary (Last 24 hours) at 10/20/2019 1138 Last data filed at 10/20/2019 0952 Gross per 24 hour  Intake 695.43 ml  Output --  Net 695.43 ml   LBM: Last BM Date: 10/19/19 Baseline Weight: Weight: 88.7 kg Most  recent weight: Weight: 88.7 kg       Palliative Assessment/Data:      Patient Active Problem List   Diagnosis Date Noted  . Palliative care encounter   . Infected pressure ulcer 10/15/2019  . Sepsis ([place]) 10/15/2019  . Lactic acidosis 10/15/2019  . Inadequate oral intake 10/15/2019  . Hip pain, bilateral 10/15/2019  . Bilateral knee pain 10/15/2019  . Pressure injury of skin 07/27/2019  . Cellulitis of knee 07/21/2019  . Effusion of right knee 07/21/2019  . Alzheimer's dementia ([place]) 07/21/2019  . Elevated LFTs 07/21/2019  . Encounter for medication review 12/17/2018  . Arthritis 11/30/2018  . Intertriginous dermatitis associated with moisture 11/30/2018  . Asthma 11/30/2018  . Depression, recurrent ([place]) 11/30/2018  . Adjustment disorder with disturbance of emotion 12/07/2017    Palliative Care Assessment & Plan   Patient Profile:    Assessment:  Sepsis secondary to multiple pressure ulcers as listed below, infected pressure ulcer of sacrum and bilateral medial knees Dementia Chronic contractures pain and debility functional ongoing decline   Recommendations/Plan:  Continue wound care and current mode of care  Recommend palliative care following up with the patient at SNF for symptom management, addressing HCPOA and for ongoing goals of care discussions.   Call placed and discussed with daughter Lacey Cook about the above.  It is her wishes that the patient ought to try SNF rehab and she is accepting of palliative services following the patient over there.  Updated her about scope of current hospitalization.  All of her questions answered  to the best of my ability.  Continue full code/full scope care.  No change in broad overall goals of care for now.  Goals of Care and Additional Recommendations:  Limitations on Scope of Treatment: Full Scope Treatment  Code Status:    Code Status Orders  (From admission, onward)         Start     Ordered   10/15/19 1120  Full code  Continuous     10/15/19 1119        Code Status History    Date Active Date Inactive Code Status Order ID Comments User Context   07/21/2019 1935 07/29/2019 1956 Full Code [id number]  [place] Eric J, [facility] Inpatient   12/06/2017 2053 12/07/2017 1643 Full Code [id number]  Charlesetta Shanks, MD ED   Advance Care Planning Activity       Prognosis:   Unable to determine  Discharge Planning:  [place] for rehab with Palliative care service follow-up  Care plan was discussed with  IDT  Thank you for allowing the Palliative Medicine Team to assist in the care of this patient.   Time In: 11 Time Out: 11.25 Total Time  25 Prolonged Time Billed  no       Greater than 50%  of this time was spent counseling and coordinating care related to the above assessment and plan.  Loistine Chance, MD  Please contact Palliative Medicine Team phone at [phone] for questions and concerns.

## 2019-10-20 NOTE — Progress Notes (Addendum)
PT HYDROTHERAPY TREATMENT   10/20/19 1600  Subjective Assessment  Subjective pt is in bil wrist restraints, she smiles at PT today, verbalizes on "ouch!!"  Patient and Family Stated Goals pt unable, no family present   Date of Onset  (present on admission)  Prior Treatments  (unsure, ?dressing changes at home )   Pt wound is progressing well, incr granulation noted. Goals revised. If wound continues to progress recommend decr frequency or terminating pulsed lavage when adequate granulation present.  See below for other new findings   Evaluation and Treatment  Evaluation and Treatment Procedures Explained to Patient/Family Yes  Evaluation and Treatment Procedures Patient unable to consent due to mental status  Pressure Injury 10/15/19 Sacrum Mid Unstageable - Full thickness tissue loss in which the base of the injury is covered by slough (yellow, tan, gray, green or brown) and/or eschar (tan, brown or black) in the wound bed. *PT* sacral pressure injury, bone   Date First Assessed/Time First Assessed: 10/15/19 1510   Location: Sacrum  Location Orientation: Mid  Staging: Unstageable - Full thickness tissue loss in which the base of the injury is covered by slough (yellow, tan, gray, green or brown) and/or esc...  Dressing Type Foam - Lift dressing to assess site every shift;Moist to dry;Other (Comment) (santyl)  Dressing Changed  Dressing Change Frequency Other (Comment)  State of Healing  (for PT M-Th/nsg daily)  Site / Wound Assessment Granulation tissue;Red;Yellow;Painful  % Wound base Red or Granulating 50%  % Wound base Yellow/Fibrinous Exudate 50%  Peri-wound Assessment Excoriated;Pink;Erythema (non-blanchable)  Wound Length (cm)  (see eval )  Margins Unattached edges (unapproximated)  Drainage Amount Minimal  Drainage Description Serous  Treatment Debridement (Selective);Cleansed;Hydrotherapy (Pulse lavage);Packing (Saline gauze);Off loading  Hydrotherapy  Pulsed lavage  therapy - wound location sacrum  Pulsed Lavage with Suction (psi) 12 psi  Pulsed Lavage with Suction - Normal Saline Used 1000 mL  Pulsed Lavage Tip Tip with splash shield  Selective Debridement  Selective Debridement - Location sacrum  Wound Therapy - Assess/Plan/Recommendations  Wound Therapy - Clinical Statement Pt noted to have 2 blisters/vesicles on back near base of L scapula, RN made aware. wound cleaned well and redressed. Decreasing  slough and incr granulation tissue present  wound bed.  pt will benefit from hydrotherapy to facilitate wound healing and decr bioburden. Small wound adjacent to the larger  wound at gluteal cleft is continuous with larger wound.Continue PT for hydrotherapy.  Wound Therapy - Functional Problem List grossly bed bound/immobile  Factors Delaying/Impairing Wound Healing Immobility;Multiple medical problems;Infection - systemic/local  Hydrotherapy Plan Debridement;Dressing change;Patient/family education;Pulsatile lavage with suction  Wound Therapy - Frequency Other (comment) (4x/wk)  Wound Therapy - Follow Up Recommendations Skilled nursing facility (for nursing/wound care)  Wound Plan continue hydrotherapy per orders M-Th for cleansing/debridement  Wound Therapy Goals - Improve the function of patient's integumentary system by progressing the wound(s) through the phases of wound healing by:  Decrease Necrotic Tissue to 30  Decrease Necrotic Tissue - Progress Updated due to goal met  Increase Granulation Tissue to 70  Increase Granulation Tissue - Progress Updated due to goal met  Improve Drainage Characteristics Min  Improve Drainage Characteristics - Progress Updated due to goal met  Time For Goal Achievement 2 weeks  Wound Therapy - Potential for Goals [place]upe Sabin, PT Acute Rehab Dept [facility]): [phone]   10/20/2019

## 2019-10-21 DIAGNOSIS — A419 Sepsis, unspecified organism: Secondary | ICD-10-CM

## 2019-10-21 DIAGNOSIS — R6520 Severe sepsis without septic shock: Secondary | ICD-10-CM

## 2019-10-21 DIAGNOSIS — L89154 Pressure ulcer of sacral region, stage 4: Secondary | ICD-10-CM

## 2019-10-21 DIAGNOSIS — E669 Obesity, unspecified: Secondary | ICD-10-CM

## 2019-10-21 LAB — BASIC METABOLIC PANEL
Anion gap: 10 (ref 5–15)
BUN: 13 mg/dL (ref 8–23)
CO2: 24 mmol/L (ref 22–32)
Calcium: 7.4 mg/dL — ABNORMAL LOW (ref 8.9–10.3)
Chloride: 103 mmol/L (ref 98–111)
Creatinine, Ser: <0.3 mg/dL — ABNORMAL LOW (ref 0.44–1.00)
Glucose, Bld: 118 mg/dL — ABNORMAL HIGH (ref 70–99)
Potassium: 3.6 mmol/L (ref 3.5–5.1)
Sodium: 137 mmol/L (ref 135–145)

## 2019-10-21 LAB — CBC
HCT: 33.5 % — ABNORMAL LOW (ref 36.0–46.0)
Hemoglobin: 11 g/dL — ABNORMAL LOW (ref 12.0–15.0)
MCH: 31.2 pg (ref 26.0–34.0)
MCHC: 32.8 g/dL (ref 30.0–36.0)
MCV: 94.9 fL (ref 80.0–100.0)
Platelets: 307 10*3/uL (ref 150–400)
RBC: 3.53 MIL/uL — ABNORMAL LOW (ref 3.87–5.11)
RDW: 15.4 % (ref 11.5–15.5)
WBC: 7 10*3/uL (ref 4.0–10.5)
nRBC: 0 % (ref 0.0–0.2)

## 2019-10-21 NOTE — Progress Notes (Signed)
Patient was transferred over to a bed with a Mattress Overlay.

## 2019-10-21 NOTE — Progress Notes (Signed)
Nutrition Follow-up  RD working remotely.   DOCUMENTATION CODES:   Obesity unspecified  INTERVENTION:  - continue Ensure Enlive TID and 30 ml Prostat TID.  NUTRITION DIAGNOSIS:   Increased nutrient needs related to wound healing, acute illness(sepsis) as evidenced by estimated needs. -ongoing  GOAL:   Patient will meet greater than or equal to 90% of their needs -unmet  MONITOR:   PO intake, Supplement acceptance, Labs, Weight trends  ASSESSMENT:   64 y.o. female with medical history of dementia. Patient was recently at [facility].  She was brought to the ED by her daughter who reported progressive weakness, worsening mentation, decreasing appetite, and a sacral pressure injury that "popped."  She is noted to be disoriented x4. Patient's diet was downgraded from Soft, thin liquids to Dysphagia 1, thin liquids on 5/23 at 1530. Per chart review, the following intakes were documented since RD assessment on 5/21:  5/22- 45% of lunch and 10% of dinner 5/23- 25% of breakfast 5/25- 100% of breakfast 5/26- 25% of breakfast and 25% of lunch  Per review of orders, patient has been accepting Ensure and prostat ~75% of the time offered. She has not been weighed since admission on 5/21.  Palliative Care saw patient yesterday. Patient remains Full Code.   Per notes: - sepsis 2/2 pressure injuries, infected sacral and bilateral knee wounds - dehydration on admission and poor oral intake - hx of Alzheimer's dementia - chronic hip and knee pain with contractures - severe debility   Labs reviewed; creatinine: <0.3 mg/dl, Ca: 7.4 mg/dl. Medications reviewed; 500 mg ascorbic acid BID, 1 tablet multivitamin with minreals and iron/day, 20 mEq Klor-Con/day (5/25-5/27), 10 mEq IV KCl x1 run 5/26, 220 mg zinc sulfate/day.   Diet Order:   Diet Order            DIET - DYS 1 Room service appropriate? Yes; Fluid consistency: Thin  Diet effective now              EDUCATION NEEDS:   Not  appropriate for education at this time  Skin:  Skin Assessment: Skin Integrity Issues: Skin Integrity Issues:: DTI, Stage II, Stage IV, Unstageable DTI: R heel x2; bilateral knees; L pretibial area Stage II: L IT Stage IV: sacrum Unstageable: L heel; L buttocks  Last BM:  5/26  Height:   Ht Readings from Last 1 Encounters:  10/15/19 5\' 1"  (1.549 m)    Weight:   Wt Readings from Last 1 Encounters:  10/15/19 88.7 kg    Estimated Nutritional Needs:  Kcal:  2500-2700 kcal Protein:  130-140 grams Fluid:  >/= 2.5 L/day     Jarome Matin, MS, RD, LDN, CNSC Inpatient Clinical Dietitian RD pager # available in AMION  After hours/weekend pager # available in [facility]

## 2019-10-21 NOTE — Progress Notes (Signed)
   10/21/19 1610  HYdrotherapy note 1330-1414  Subjective Assessment  Subjective pt is in bil wrist restraints, she smiles at PT today, verbalizes on "ouch!!"  Patient and Family Stated Goals pt unable, no family present   Date of Onset  (present on admission)  Prior Treatments  (unsure, ?dressing changes at home )  Evaluation and Treatment  Evaluation and Treatment Procedures Explained to Patient/Family Yes  Evaluation and Treatment Procedures Patient unable to consent due to mental status  Pressure Injury 10/15/19 Sacrum Mid Unstageable - Full thickness tissue loss in which the base of the injury is covered by slough (yellow, tan, gray, green or brown) and/or eschar (tan, brown or black) in the wound bed. *PT* sacral pressure injury, bone   Date First Assessed/Time First Assessed: 10/15/19 1510   Location: Sacrum  Location Orientation: Mid  Staging: Unstageable - Full thickness tissue loss in which the base of the injury is covered by slough (yellow, tan, gray, green or brown) and/or esc...  Dressing Type Foam - Lift dressing to assess site every shift;Moist to dry;Other (Comment) (santyl)  Dressing Changed  Dressing Change Frequency Other (Comment)  State of Healing Non-healing (for PT M-W-F)/nsg daily PRN)  Site / Wound Assessment Granulation tissue;Red;Yellow;Painful  % Wound base Red or Granulating 50%  % Wound base Yellow/Fibrinous Exudate 50%  Peri-wound Assessment Excoriated;Pink;Erythema (non-blanchable)  Wound Length (cm) 3 cm  Wound Width (cm) 3.5 cm  Wound Depth (cm) 1.5 cm  Wound Surface Area (cm^2) 10.5 cm^2  Wound Volume (cm^3) 15.75 cm^3  Margins Unattached edges (unapproximated)  Drainage Amount Minimal  Drainage Description Serous  Treatment Debridement (Selective);Hydrotherapy (Pulse lavage);Packing (Saline gauze)with santyl  Hydrotherapy  Pulsed lavage therapy - wound location sacrum  Pulsed Lavage with Suction (psi) 12 psi  Pulsed Lavage with Suction -  Normal Saline Used 1000 mL  Pulsed Lavage Tip Tip with splash shield  Selective Debridement  Selective Debridement - Location sacrum  Selective Debridement - Tools Used Forceps;Scissors  Selective Debridement - Tissue Removed black/grey necrotic tissue  Wound Therapy - Assess/Plan/Recommendations  Wound Therapy - Clinical Statement Patient in bed with large loose BM, some contaminant into wound bed.Decreasing slough. WOCRN to inspect wound and recommends to decrease to 3 x week( M/W/F).  Wound Therapy - Functional Problem List grossly bed bound/immobile  Factors Delaying/Impairing Wound Healing Immobility;Multiple medical problems;Infection - systemic/local  Hydrotherapy Plan Debridement;Dressing change;Patient/family education;Pulsatile lavage with suction  Wound Therapy - Frequency Other (comment) (4x/wk)  Wound Therapy - Follow Up Recommendations Skilled nursing facility (for nursing/wound care)  Wound Plan continue hydrotherapy per orders M-Th for cleansing/debridement  Wound Therapy Goals - Improve the function of patient's integumentary system by progressing the wound(s) through the phases of wound healing by:  Decrease Necrotic Tissue to 30  Decrease Necrotic Tissue - Progress Progressing toward goal  Increase Granulation Tissue to 70  Increase Granulation Tissue - Progress Progressing toward goal  Improve Drainage Characteristics Min  Improve Drainage Characteristics - Progress Progressing toward goal  Time For Goal Achievement 2 weeks  Wound Therapy - Potential for Goals [place] Pager [phone] Office [phone]

## 2019-10-21 NOTE — Progress Notes (Signed)
   10/21/19 0726  Assess: MEWS Score  Temp 99.8 F (37.7 C)  BP 118/61  Pulse Rate (!) 115  ECG Heart Rate (!) 120  Resp (!) 21  SpO2 94 %  O2 Device Room Air  Assess: MEWS Score  MEWS Temp 0  MEWS Systolic 0  MEWS Pulse 2  MEWS RR 1  MEWS LOC 0  MEWS Score 3  MEWS Score Color Yellow  Assess: if the MEWS score is Yellow or Red  Were vital signs taken at a resting state? Yes  Focused Assessment Documented focused assessment  Early Detection of Sepsis Score *See Row Information* Medium  MEWS guidelines implemented *See Row Information* Yes  Treat  MEWS Interventions Other (Comment) (morphine administered by PM RN as pt reported pain)  Escalate  MEWS: Escalate Yellow: discuss with charge nurse/RN and consider discussing with provider and RRT

## 2019-10-21 NOTE — Progress Notes (Signed)
PROGRESS NOTE  Lacey Cook NUU:[id number] DOB: 1955/07/14 DOA: 10/15/2019 PCP: Richarda Osmond, DO  Brief History   64 year old woman PMH advanced dementia presented from home with failure to thrive.  Admitted for sepsis secondary to multiple pressure ulcers.  A & P  Severe sepsis secondary to multiple pressure ulcers --Seen by wound care, recommendation for hydrotherapy, will continue Monday Wednesday Friday.  Continue management per wound care.  Foley catheter placed due to infected pressure ulcers and wound swelling. --Continue antibiotics.  Sepsis symptomatology appears resolved.  Advanced Alzheimer's dementia, contractures of the lower extremities --Effexor to continue, continue safety sitter and wrist restraints due to interference with medical treatment  Diabetes mellitus type 2 --Stable.  Social.  Not able to be cared for at home.  Obesity unspecified  Wound type: 1. Stage 4 Pressure injury; sacrum: 4cm x 3cm x 4cm   2. Unstageable Pressure injury; left buttock: 3.0cm x 2.0cm x 0.2cm  3. Stage 2 Pressure injury; left ischium: 0.5cm x 0.5cm x 0.1cm  4. Partial thickness wound; right hip; 0.5cm x 0.5cm x 0.1cm  5. Partial thickness wound; right hip; 0.5cm x 0.5cm x 0.1cm  6. Resolving Deep Tissue Pressure injury; right lateral heel distal: 1.5cm x 1.5cm x 0cm  7. Resolving Deep Tissue Pressure Injury: right lateral foot; 2.5cm x 2.0cm x 0cm  8. Unstageable Pressure Injury: left plantar heel surface; 5cm x4cm x 0.1cm (at medial aspect) 9. Deep Tissue Pressure Injury: left medial knee; 6cm x 5cm x 0.1cm 10. Deep Tissue Pressure Injury: right medial knee; 5.5cm x 4.5cm x 0.1cm  11. Deep Tissue Pressure Injury: left medial pretibial area; 6cm x 3cm x 0cm  Pressure Injury POA: Yes x 11  Disposition Plan:   Status is: Inpatient  Remains inpatient appropriate because:Inpatient level of care appropriate due to severity of illness   Dispo: The patient is from: Home          Anticipated d/c is to: SNF              Anticipated d/c date is: 3 days              Patient currently is not medically stable to d/c.  DVT prophylaxis: enoxaparin Code Status: Full Family Communication: none present    Murray Hodgkins, MD  [facility] Direct contact: see [email or web address] (further directions at bottom of note if needed) 7PM-7AM contact night coverage as at bottom of note 10/21/2019, 6:30 PM  LOS: 6 days   Significant Hospital Events   .    Consults:  . Wound care RN . Palliative medicine   Procedures:  . Hydrotherapy  Significant Diagnostic Tests:  Marland Kitchen    Micro Data:  .    Antimicrobials:  .   Interval History/Subjective  Patient occasionally cries out but is nonverbal.  Objective   Vitals:  Vitals:   10/21/19 1257 10/21/19 1530  BP: 118/70 109/62  Pulse: (!) 116 (!) 118  Resp: 19   Temp: 99.7 F (37.6 C) 99.5 F (37.5 C)  SpO2: (!) 89% 91%    Exam:  Constitutional.  Appears chronically ill, nontoxic. Psychiatric.  Does not answer questions. Respiratory.  Clear to auscultation bilaterally.  No wheezes, rales or rhonchi.  Normal respiratory effort. Cardiovascular.  Regular rate and rhythm.  No murmur, rub or gallop.  No significant lower extremity edema. Abdomen.  Soft.  I have personally reviewed the following:   Today's Data  . Basic metabolic panel unremarkable . CBC stable  Scheduled  Meds: . ascorbic acid  500 mg Oral BID  . Chlorhexidine Gluconate Cloth  6 each Topical Daily  . collagenase   Topical Daily  . enoxaparin (LOVENOX) injection  40 mg Subcutaneous Q24H  . feeding supplement (ENSURE ENLIVE)  237 mL Oral TID BM  . feeding supplement (PRO-STAT SUGAR FREE 64)  30 mL Oral TID BM  . multivitamins with iron  1 tablet Oral Daily  . sodium chloride flush  3 mL Intravenous Q12H  . venlafaxine  75 mg Oral BID  . zinc sulfate  220 mg Oral Daily   Continuous Infusions: . sodium chloride 250 mL (10/20/19 2200)  .  cefTRIAXone (ROCEPHIN)  IV Stopped (10/21/19 1155)  . metronidazole 500 mg (10/21/19 1732)    Principal Problem:   Severe sepsis ([place]) Active Problems:   Asthma   Alzheimer's dementia ([place])   Infected pressure ulcer   Sepsis ([place])   Lactic acidosis   Inadequate oral intake   Hip pain, bilateral   Bilateral knee pain   Palliative care encounter   Obesity (BMI 30-39.9)   LOS: 6 days   How to contact the [facility] Attending or Consulting provider [place] or covering provider during after hours [place], for this patient?  1. Check the care team in [facility] and look for a) attending/consulting TRH provider listed and b) the [facility] team listed 2. Log into [email or web address] and use [place] universal password to access. If you do not have the password, please contact the hospital operator. 3. Locate the [facility] provider you are looking for under [facility] and page to a number that you can be directly reached. 4. If you still have difficulty reaching the provider, please page the [facility] (Director on Call) for the Hospitalists listed on amion for assistance.

## 2019-10-21 NOTE — Consult Note (Signed)
[facility] Nurse wound follow up Wound type: Stage 4 pressure injury; unstageable pressure injury Measurement: see PT notes from hydrotherapy session today Wound bed:90% clean/10% slough/fibrinous material  Drainage (amount, consistency, odor) moderate, no odor, yellow slimy  Periwound: intact  Dressing procedure/placement/frequency: Continue Santyl to buttock wound daily.  Line wound base with silver hydrofiber (Aquacel Ag+) beginning 10/22/19. Fluff fill with kerlix. Top with ABD pad. Change daily PT to decrease hydrotherapy to M/W/F, discussed with PT at the bedside today Air mattress in place for pressure redistribution and moisture management   WOC Nurse team will follow with you and see patient within 10 days for wound assessments.  Please notify [place] nurses of any acute changes in the wounds or any new areas of concern [place] MSN, [place], [place], [place]

## 2019-10-21 NOTE — TOC Progression Note (Signed)
Transition of Care [facility]) - Progression Note    Patient Details  Name: Lacey Cook MRN: [id number] Date of Birth: Apr 04, 1956  Transition of [facility]) CM/SW Contact  [place] Nunley, Juliann Pulse, RN Phone Number: 10/21/2019, 2:51 PM  Clinical Narrative: await medical stablility for SNF [facility]-will need covid 48hrs of d/c.     Expected Discharge Plan: [place] Barriers to Discharge: Continued Medical Work up  Expected Discharge Plan and Services Expected Discharge Plan: [place]   Discharge Planning Services: CM Consult Post Acute Care Choice: Resumption of Svcs/PTA Provider Living arrangements for the past 2 months: Single Family Home                                       Social Determinants of Health (SDOH) Interventions    Readmission Risk Interventions No flowsheet data found.

## 2019-10-22 DIAGNOSIS — F0281 Dementia in other diseases classified elsewhere with behavioral disturbance: Secondary | ICD-10-CM

## 2019-10-22 LAB — CREATININE, SERUM
Creatinine, Ser: 0.31 mg/dL — ABNORMAL LOW (ref 0.44–1.00)
GFR calc Af Amer: >60 mL/min (ref >60)
GFR calc non Af Amer: >60 mL/min (ref >60)

## 2019-10-22 NOTE — Progress Notes (Addendum)
   10/22/19  Hydrotherapy note   Subjective Assessment  Subjective Ow  Patient and Family Stated Goals pt unable, no family present   Date of Onset  (present on admission)  Prior Treatments  (unsure, ?dressing changes at home )  Evaluation and Treatment  Evaluation and Treatment Procedures Explained to Patient/Family Yes  Evaluation and Treatment Procedures Patient unable to consent due to mental status  Pressure Injury 10/15/19 Sacrum Mid Unstageable - Full thickness tissue loss in which the base of the injury is covered by slough (yellow, tan, gray, green or brown) and/or eschar (tan, brown or black) in the wound bed. *PT* sacral pressure injury, bone   Date First Assessed/Time First Assessed: 10/15/19 1510   Location: Sacrum  Location Orientation: Mid  Staging: Unstageable - Full thickness tissue loss in which the base of the injury is covered by slough (yellow, tan, gray, green or brown) and/or esc...  Dressing Type Foam - Lift dressing to assess site every shift;Moist to dry;Other (Comment) (santyl)  Dressing Changed  Dressing Change Frequency Other (Comment)  State of Healing Non-healing (for PT M-Th/nsg daily)  Site / Wound Assessment Granulation tissue;Red;Yellow;Painful  % Wound base Red or Granulating 75%  % Wound base Yellow/Fibrinous Exudate 25%  Peri-wound Assessment Excoriated;Pink;Erythema (non-blanchable)  Wound Length (cm) 3 cm  Wound Width (cm) 3.5 cm  Wound Depth (cm) 1.5 cm  Wound Surface Area (cm^2) 10.5 cm^2  Wound Volume (cm^3) 15.75 cm^3  Undermining (cm) 2  Margins Unattached edges (unapproximated)  Drainage Amount Minimal  Drainage Description Serous  Treatment Debridement (Selective);Hydrotherapy (Pulse lavage);Packing (Saline gauze) (santyl)  Hydrotherapy  Pulsed lavage therapy - wound location sacrum  Pulsed Lavage with Suction (psi) 8 psi  Pulsed Lavage with Suction - Normal Saline Used 1000 mL  Pulsed Lavage Tip Tip with splash shield  Selective  Debridement  Selective Debridement - Location sacrum  Selective Debridement - Tools Used Forceps;Scissors  Selective Debridement - Tissue Removed stringy yellow  Wound Therapy - Assess/Plan/Recommendations  Wound Therapy - Clinical Statement The  wound is much improved, increased granulation. Patient tolerates well. will see how wound is on 5/31 and consider discontinuation due to increased granulation.  Wound Therapy - Functional Problem List grossly bed bound/immobile  Factors Delaying/Impairing Wound Healing Immobility;Multiple medical problems;Infection - systemic/local  Hydrotherapy Plan Debridement;Dressing change;Patient/family education;Pulsatile lavage with suction  Wound Therapy - Frequency Other (comment) 3 x -MWF  Wound Therapy - Follow Up Recommendations Skilled nursing facility (for nursing/wound care)  Wound Plan continue hydrotherapy per orders M-Th for cleansing/debridement  Wound Therapy Goals - Improve the function of patient's integumentary system by progressing the wound(s) through the phases of wound healing by:  Decrease Necrotic Tissue to 20  Decrease Necrotic Tissue - Progress Progressing toward goal  Increase Granulation Tissue to 80  Increase Granulation Tissue - Progress Progressing toward goal  Improve Drainage Characteristics Min  Improve Drainage Characteristics - Progress Progressing toward goal  Time For Goal Achievement 2 weeks  Wound Therapy - Potential for Goals [place] Pager [phone] Office [phone]

## 2019-10-22 NOTE — Progress Notes (Signed)
   10/21/19 2238  Assess: MEWS Score  Temp 99.2 F (37.3 C)  BP 109/62  Pulse Rate (!) 112  Resp 18  SpO2 93 %  O2 Device Room Air  Assess: MEWS Score  MEWS Temp 0  MEWS Systolic 0  MEWS Pulse 2  MEWS RR 0  MEWS LOC 0  MEWS Score 2  MEWS Score Color Yellow  Assess: if the MEWS score is Yellow or Red  Were vital signs taken at a resting state? Yes  Focused Assessment Documented focused assessment  Early Detection of Sepsis Score *See Row Information* Low  MEWS guidelines implemented *See Row Information* Yes  Treat  MEWS Interventions Other (Comment);Administered prn meds/treatments (HR triggered Yellow [place]. Analgesic to be given, increasing )

## 2019-10-22 NOTE — Progress Notes (Signed)
PROGRESS NOTE  Lacey Cook [id number] DOB: 04-26-1956 DOA: 10/15/2019 PCP: Richarda Osmond, DO  Brief History   64 year old woman PMH advanced dementia presented from home with failure to thrive.  Admitted for sepsis secondary to multiple pressure ulcers.  A & P  Severe sepsis secondary to multiple pressure ulcers --Continue wound care as per wound care nurse.  Continue hydrotherapy Monday Wednesday Friday.  Continue Foley catheter for wound healing.  --Continue antibiotics.    Advanced Alzheimer's dementia, contractures of the lower extremities --Effexor to continue, continue safety sitter.  Stop wrist restraints and monitor.  Diabetes mellitus type 2? --Serum blood sugar stable.  No need for monitoring, not on any outpatient medications.  Social.  Not able to be cared for at home.  Obesity unspecified  Multiple wounds as previously documented.  Disposition Plan:   Status is: Inpatient  Remains inpatient appropriate because:Inpatient level of care appropriate due to severity of illness   Dispo: The patient is from: Home              Anticipated d/c is to: SNF              Anticipated d/c date is: 3 days              Patient currently is not medically stable to d/c.  DVT prophylaxis: enoxaparin Code Status: Full Family Communication: none present    Murray Hodgkins, MD  [facility] Direct contact: see [email or web address] (further directions at bottom of note if needed) 7PM-7AM contact night coverage as at bottom of note 10/22/2019, 6:24 PM  LOS: 7 days   Significant Hospital Events   .    Consults:  . Wound care RN . Palliative medicine   Procedures:  . Hydrotherapy  Significant Diagnostic Tests:  Marland Kitchen    Micro Data:  .    Antimicrobials:  . Cefepime 5/21-5/25 . Ceftriaxone 5/25-- . Metronidazole 5/21-- . Vancomycin 5/21-5/25  Interval History/Subjective  Per nursing tech, eating fairly well 40% of breakfast, 30% for lunch.  Objective    Vitals:  Vitals:   10/22/19 0508 10/22/19 1202  BP: 109/61 106/75  Pulse: (!) 110 (!) 113  Resp: 18 19  Temp: 98.6 F (37 C) 98.9 F (37.2 C)  SpO2: 92% 92%    Exam:  Constitutional.  Appears calm and comfortable today. Respiratory.  Clear to auscultation bilaterally.  No wheezes, rales or rhonchi.  Normal respiratory effort. Cardiovascular.  Regular rate and rhythm.  No murmur, rub or gallop. Psychiatric.  Appears to have advanced dementia.  Does answer yes or no but not clearly understanding questions.  I have personally reviewed the following:   Today's Data  . No new data  Scheduled Meds: . ascorbic acid  500 mg Oral BID  . Chlorhexidine Gluconate Cloth  6 each Topical Daily  . collagenase   Topical Daily  . enoxaparin (LOVENOX) injection  40 mg Subcutaneous Q24H  . feeding supplement (ENSURE ENLIVE)  237 mL Oral TID BM  . feeding supplement (PRO-STAT SUGAR FREE 64)  30 mL Oral TID BM  . multivitamins with iron  1 tablet Oral Daily  . sodium chloride flush  3 mL Intravenous Q12H  . venlafaxine  75 mg Oral BID  . zinc sulfate  220 mg Oral Daily   Continuous Infusions: . sodium chloride 250 mL (10/20/19 2200)  . cefTRIAXone (ROCEPHIN)  IV Stopped (10/22/19 1412)  . metronidazole Stopped (10/22/19 1147)    Principal Problem:   Severe sepsis ([place])  Active Problems:   Asthma   Alzheimer's dementia ([place])   Infected pressure ulcer   Sepsis ([place])   Lactic acidosis   Inadequate oral intake   Hip pain, bilateral   Bilateral knee pain   Palliative care encounter   Obesity (BMI 30-39.9)   LOS: 7 days   How to contact the [facility] Attending or Consulting provider [place] or covering provider during after hours [place], for this patient?  1. Check the care team in [facility] and look for a) attending/consulting TRH provider listed and b) the [facility] team listed 2. Log into [email or web address] and use [place]'s universal password to access. If you do not have the password, please  contact the hospital operator. 3. Locate the [facility] provider you are looking for under [facility] and page to a number that you can be directly reached. 4. If you still have difficulty reaching the provider, please page the [facility] (Director on Call) for the Hospitalists listed on amion for assistance.

## 2019-10-23 DIAGNOSIS — L98491 Non-pressure chronic ulcer of skin of other sites limited to breakdown of skin: Secondary | ICD-10-CM

## 2019-10-23 NOTE — Progress Notes (Addendum)
PROGRESS NOTE  Lacey Cook [id number] DOB: 02-Jan-1956 DOA: 10/15/2019 PCP: Richarda Osmond, DO  Brief History   64 year old woman PMH advanced dementia presented from home with failure to thrive.  Admitted for sepsis secondary to multiple pressure ulcers.  A & P  Severe sepsis secondary to multiple pressure ulcers --Sepsis resolved.  Continue wound care as per wound care nurse.  Continue hydrotherapy Monday Wednesday Friday.  Continue Foley catheter for wound healing.  --Continue antibiotics.  Likely stop next 1 to 2 days.  Advanced Alzheimer's dementia with behavioral disturbance, contractures of the lower extremities --Continue Effexor and safety sitter.  Very difficult per staff to prevent the patient playing with stool.  Diabetes mellitus type 2? --Serum blood sugar stable.  No need for monitoring, not on any outpatient medications.  Social.  Not able to be cared for at home.  Obesity unspecified  Multiple wounds as previously documented.  Disposition Plan:   Status is: Inpatient  Remains inpatient appropriate because:Inpatient level of care appropriate due to severity of illness   Dispo: The patient is from: Home              Anticipated d/c is to: SNF              Anticipated d/c date is: When hydrotherapy complete              Patient currently is not medically stable to d/c.  DVT prophylaxis: enoxaparin Code Status: Full Family Communication: updated daughter Suanne Marker by telephone    Murray Hodgkins, MD  [facility] Direct contact: see [email or web address] (further directions at bottom of note if needed) 7PM-7AM contact night coverage as at bottom of note 10/23/2019, 1:50 PM  LOS: 8 days   Significant Hospital Events   .    Consults:  . Wound care RN . Palliative medicine   Procedures:  . Hydrotherapy  Significant Diagnostic Tests:  Marland Kitchen    Micro Data:  .    Antimicrobials:  . Cefepime 5/21-5/25 . Ceftriaxone 5/25-- . Metronidazole  5/21-- . Vancomycin 5/21-5/25  Interval History/Subjective  Confused per nursing tech.  Patient plays with stool, was throwing it on the floor, wiping it all over the place.  Objective   Vitals:  Vitals:   10/23/19 0632 10/23/19 1323  BP: 117/60 99/62  Pulse: (!) 101 (!) 106  Resp: 17 20  Temp: 98.6 F (37 C) 98.3 F (36.8 C)  SpO2: 94% 95%    Exam:  Constitutional.  Appears calm and comfortable, confused. Psychiatric.  Not assessable.  Answers briefly to some questions.  Confused. Respiratory.  Clear to auscultation bilaterally.  No wheezes, rales or rhonchi.  Normal respiratory effort. Cardiovascular.  Regular rate and rhythm.  No murmur, rub or gallop.  Abdomen.  Soft. Wounds are covered.  I have personally reviewed the following:   Today's Data  . No new data  Scheduled Meds: . ascorbic acid  500 mg Oral BID  . Chlorhexidine Gluconate Cloth  6 each Topical Daily  . collagenase   Topical Daily  . enoxaparin (LOVENOX) injection  40 mg Subcutaneous Q24H  . feeding supplement (ENSURE ENLIVE)  237 mL Oral TID BM  . feeding supplement (PRO-STAT SUGAR FREE 64)  30 mL Oral TID BM  . multivitamins with iron  1 tablet Oral Daily  . sodium chloride flush  3 mL Intravenous Q12H  . venlafaxine  75 mg Oral BID  . zinc sulfate  220 mg Oral Daily   Continuous Infusions: .  sodium chloride 250 mL (10/20/19 2200)  . cefTRIAXone (ROCEPHIN)  IV 2 g (10/23/19 1017)  . metronidazole 100 mL/hr at 10/23/19 1000    Principal Problem:   Severe sepsis [facility]) Active Problems:   Asthma   Alzheimer's dementia ([place])   Infected pressure ulcer   Sepsis ([place])   Lactic acidosis   Inadequate oral intake   Hip pain, bilateral   Bilateral knee pain   Palliative care encounter   Obesity (BMI 30-39.9)   LOS: 8 days   How to contact the [facility] Attending or Consulting provider [place] or covering provider during after hours [place], for this patient?  1. Check the care team in [facility] and look  for a) attending/consulting TRH provider listed and b) the [facility] team listed 2. Log into [email or web address] and use [place] universal password to access. If you do not have the password, please contact the hospital operator. 3. Locate the [facility] provider you are looking for under [facility] and page to a number that you can be directly reached. 4. If you still have difficulty reaching the provider, please page the [facility] (Director on Call) for the Hospitalists listed on amion for assistance.

## 2019-10-24 NOTE — Progress Notes (Signed)
PROGRESS NOTE  Lacey Cook [id number] DOB: [birth year] DOA: 10/15/2019 PCP: Richarda Osmond, DO  Brief History   64 year old woman PMH advanced dementia presented from home with failure to thrive.  Admitted for sepsis secondary to multiple pressure ulcers.  A & P  Severe sepsis secondary to multiple pressure ulcers. Sepsis resolved.  --Continue wound care as per wound care RN.  Continue hydrotherapy Monday, Wednesday, Friday.   --Continue Foley catheter for wound healing.   --Complete antibiotics today  Advanced Alzheimer's dementia with behavioral disturbance, contractures of the lower extremities --Continue Effexor and safety sitter.  Very difficult per staff to prevent the patient playing with stool.  Diabetes mellitus type 2? --Serum blood sugar noted to be stable.  No need for monitoring, not on any outpatient medications.  Social.  Not able to be cared for at home.  Plan for SNF.  Obesity unspecified  Multiple wounds as previously documented.  Disposition Plan:   Status is: Inpatient  Remains inpatient appropriate because:Inpatient level of care appropriate due to severity of illness   Dispo: The patient is from: Home              Anticipated d/c is to: SNF              Anticipated d/c date is: When hydrotherapy complete              Patient currently is not medically stable to d/c.  DVT prophylaxis: enoxaparin Code Status: Full Family Communication: Updated daughter yesterday    Murray Hodgkins, MD  [facility] Direct contact: see [email or web address] (further directions at bottom of note if needed) 7PM-7AM contact night coverage as at bottom of note 10/24/2019, 5:09 PM  LOS: 9 days    Consults:  . Wound care RN . PT for hydrotherapy . Palliative medicine   Procedures:  . Hydrotherapy  Significant Diagnostic Tests:   CT abdomen/pelvis: Lower sacral decubitus ulcer with leftward subcutaneous tracking by 2 cm. No collection or visible osteomyelitis    Micro Data:  . UC no growth, final . BC no growth, final   Antimicrobials:  . Cefepime 5/21-5/25 . Ceftriaxone 5/25--5/30 . Metronidazole 5/21--5/30 . Vancomycin 5/21-5/25  Interval History/Subjective  Seems to be doing okay per nursing tech.  Eating fairly well.  Patient unable to offer history secondary to dementia.  Objective   Vitals:  Vitals:   10/24/19 0548 10/24/19 1248  BP: 113/73 113/75  Pulse: (!) 112 (!) 110  Resp:  (!) 25  Temp: 99 F (37.2 C) 99.1 F (37.3 C)  SpO2: 94% 90%    Exam:  Constitutional.  Appears calm, comfortable. Cardiovascular.  Regular rate and rhythm.  No murmur, rub or gallop. Respiratory.  Clear to auscultation bilaterally.  No wheezes, rales or rhonchi.  Normal respiratory effort. Psychiatric.  Did not speak today.  Appears confused.  Appears calm.  I have personally reviewed the following:   Today's Data  . No new data  Scheduled Meds: . ascorbic acid  500 mg Oral BID  . Chlorhexidine Gluconate Cloth  6 each Topical Daily  . collagenase   Topical Daily  . enoxaparin (LOVENOX) injection  40 mg Subcutaneous Q24H  . feeding supplement (ENSURE ENLIVE)  237 mL Oral TID BM  . feeding supplement (PRO-STAT SUGAR FREE 64)  30 mL Oral TID BM  . multivitamins with iron  1 tablet Oral Daily  . sodium chloride flush  3 mL Intravenous Q12H  . venlafaxine  75 mg Oral BID  .  zinc sulfate  220 mg Oral Daily   Continuous Infusions: . sodium chloride 1,000 mL (10/23/19 2109)  . metronidazole 500 mg (10/24/19 0857)    Principal Problem:   Severe sepsis ([facility]) Active Problems:   Asthma   Alzheimer's dementia ([place])   Infected pressure ulcer   Sepsis ([place])   Lactic acidosis   Inadequate oral intake   Hip pain, bilateral   Bilateral knee pain   Palliative care encounter   Obesity (BMI 30-39.9)   LOS: 9 days   How to contact the [facility] Attending or Consulting provider [place] or covering provider during after hours [place], for this  patient?  1. Check the care team in [facility] and look for a) attending/consulting TRH provider listed and b) the [facility] team listed 2. Log into [email or web address] and use 's universal password to access. If you do not have the password, please contact the hospital operator. 3. Locate the [facility] provider you are looking for under [facility] and page to a number that you can be directly reached. 4. If you still have difficulty reaching the provider, please page the [facility] (Director on Call) for the Hospitalists listed on amion for assistance.

## 2019-10-25 DIAGNOSIS — E669 Obesity, unspecified: Secondary | ICD-10-CM

## 2019-10-25 LAB — BASIC METABOLIC PANEL
Anion gap: 6 (ref 5–15)
BUN: 10 mg/dL (ref 8–23)
CO2: 26 mmol/L (ref 22–32)
Calcium: 7.3 mg/dL — ABNORMAL LOW (ref 8.9–10.3)
Chloride: 105 mmol/L (ref 98–111)
Creatinine, Ser: <0.3 mg/dL — ABNORMAL LOW (ref 0.44–1.00)
Glucose, Bld: 92 mg/dL (ref 70–99)
Potassium: 3.9 mmol/L (ref 3.5–5.1)
Sodium: 137 mmol/L (ref 135–145)

## 2019-10-25 LAB — CBC
HCT: 32 % — ABNORMAL LOW (ref 36.0–46.0)
Hemoglobin: 10.2 g/dL — ABNORMAL LOW (ref 12.0–15.0)
MCH: 30.9 pg (ref 26.0–34.0)
MCHC: 31.9 g/dL (ref 30.0–36.0)
MCV: 97 fL (ref 80.0–100.0)
Platelets: 387 10*3/uL (ref 150–400)
RBC: 3.3 MIL/uL — ABNORMAL LOW (ref 3.87–5.11)
RDW: 17.3 % — ABNORMAL HIGH (ref 11.5–15.5)
WBC: 5.4 10*3/uL (ref 4.0–10.5)
nRBC: 0 % (ref 0.0–0.2)

## 2019-10-25 NOTE — Progress Notes (Signed)
PROGRESS NOTE  Lacey Cook [id number] DOB: Aug 28, 1955 DOA: 10/15/2019 PCP: Richarda Osmond, DO   LOS: 10 days   Brief narrative: As per HPI and previous provider, 64 year old woman PMH advanced dementia presented from home with failure to thrive.  Admitted for sepsis secondary to multiple pressure ulcers.  Undergoing hydrotherapy.  Plan is skilled nursing facility on discharge   Assessment/Plan:  Principal Problem:   Severe sepsis ([place]) Active Problems:   Asthma   Alzheimer's dementia ([place])   Infected pressure ulcer   Sepsis ([place])   Lactic acidosis   Inadequate oral intake   Hip pain, bilateral   Bilateral knee pain   Palliative care encounter   Obesity (BMI 30-39.9)  Severe sepsis secondary to multiple pressure ulcers.   Patient has completed antibiotics.  Sepsis physiology has resolved.  Currently on hydrotherapy as per physical therapy.    Advanced Alzheimer's dementia with behavioral disturbance, contractures of the lower extremities --On Effexor and safety sitter.    Question diabetes mellitus type 2.  Hemoglobin A1c of 4.9 on 11/24/2018.  Closely monitor.  Social issue.  Not able to be cared for at home.  Plan for SNF.  Obesity unspecified.  Pressure ulceration present on admission. sacral unstageable ulceration.  Continue hydrotherapy.  VTE Prophylaxis: Lovenox subcu  Code Status: Full code  Family Communication: None today  Status is: Inpatient  Remains inpatient appropriate because:Ongoing wound care,Needs skilled nursing facility placement   Dispo: The patient is from: Home              Anticipated d/c is to: SNF              Anticipated d/c date is: Undetermined.  After hydrotherapy              Patient currently is not medically stable to d/c.  Currently receiving hydrotherapy    Consultants:  Wound care  Palliative care  Procedures:  Hydrotherapy  Antibiotics:   Cefepime 5/21-5/25  Ceftriaxone 5/25--  Metronidazole  5/21--  Vancomycin 5/21-5/25  Anti-infectives (From admission, onward)   Start     Dose/Rate Route Frequency Ordered Stop   10/19/19 1100  cefTRIAXone (ROCEPHIN) 2 g in sodium chloride 0.9 % 100 mL IVPB     2 g 200 mL/hr over 30 Minutes Intravenous Every 24 hours 10/19/19 0956 10/24/19 2050   10/16/19 0400  vancomycin (VANCOCIN) IVPB 1000 mg/200 mL premix  Status:  Discontinued     1,000 mg 200 mL/hr over 60 Minutes Intravenous Every 24 hours 10/15/19 1049 10/19/19 0955   10/15/19 1200  metroNIDAZOLE (FLAGYL) IVPB 500 mg     500 mg 100 mL/hr over 60 Minutes Intravenous Every 8 hours 10/15/19 1119 10/24/19 2359   10/15/19 1200  ceFEPIme (MAXIPIME) 2 g in sodium chloride 0.9 % 100 mL IVPB  Status:  Discontinued     2 g 200 mL/hr over 30 Minutes Intravenous Every 8 hours 10/15/19 1037 10/19/19 0955   10/15/19 0315  ceFEPIme (MAXIPIME) 2 g in sodium chloride 0.9 % 100 mL IVPB     2 g 200 mL/hr over 30 Minutes Intravenous  Once 10/15/19 0305 10/15/19 0534   10/15/19 0315  metroNIDAZOLE (FLAGYL) IVPB 500 mg     500 mg 100 mL/hr over 60 Minutes Intravenous  Once 10/15/19 0305 10/15/19 0533   10/15/19 0315  vancomycin (VANCOCIN) IVPB 1000 mg/200 mL premix  Status:  Discontinued     1,000 mg 200 mL/hr over 60 Minutes Intravenous  Once 10/15/19 0305  10/15/19 0309   10/15/19 0315  vancomycin (VANCOREADY) IVPB 1500 mg/300 mL     1,500 mg 150 mL/hr over 120 Minutes Intravenous  Once 10/15/19 0310 10/15/19 0715     Subjective: Today, patient was seen and examined at bedside.  unable to verbalize much, nursing staff reported leg pain and back pain especially on movement in the extremities.  Objective: Vitals:   10/24/19 2343 10/25/19 0559  BP: 119/72 104/72  Pulse: (!) 109 (!) 102  Resp: 18 15  Temp: 98.4 F (36.9 C) 98.9 F (37.2 C)  SpO2: 95% 94%    Intake/Output Summary (Last 24 hours) at 10/25/2019 0742 Last data filed at 10/25/2019 9767 Gross per 24 hour  Intake 680 ml  Output  1850 ml  Net -1170 ml   Filed Weights   10/15/19 0221  Weight: 88.7 kg   Body mass index is 36.94 kg/m.   Physical Exam: GENERAL: Patient is alert awake minimally verbal, obese HENT: No scleral pallor or icterus. Pupils equally reactive to light. Oral mucosa is moist NECK: is supple, no gross swelling noted. CHEST: Clear to auscultation. No crackles or wheezes.  Diminished breath sounds bilaterally. CVS: S1 and S2 heard, no murmur. Regular rate and rhythm.  ABDOMEN: Soft, non-tender, bowel sounds are present. EXTREMITIES: The left extremity with CNS: Cranial nerves are intact. No focal motor deficits. SKIN: Decubitus ulceration on the sacral area  Data Review: I have personally reviewed the following laboratory data and studies,  CBC: Recent Labs  Lab 10/20/19 0452 10/21/19 0515 10/25/19 0438  WBC 8.1 7.0 5.4  HGB 11.1* 11.0* 10.2*  HCT 33.2* 33.5* 32.0*  MCV 92.7 94.9 97.0  PLT 263 307 341   Basic Metabolic Panel: Recent Labs  Lab 10/19/19 1509 10/20/19 0452 10/20/19 0718 10/21/19 0515 10/22/19 0520 10/25/19 0438  NA 136 134*  --  137  --  137  K 3.1* 2.9*  --  3.6  --  3.9  CL 105 105  --  103  --  105  CO2 24 23  --  24  --  26  GLUCOSE 96 93  --  118*  --  92  BUN 11 7*  --  13  --  10  CREATININE <0.30* <0.30*  --  <0.30* 0.31* <0.30*  CALCIUM 7.6* 7.3*  --  7.4*  --  7.3*  MG  --   --  1.7  --   --   --    Liver Function Tests: No results for input(s): AST, ALT, ALKPHOS, BILITOT, PROT, ALBUMIN in the last 168 hours. No results for input(s): LIPASE, AMYLASE in the last 168 hours. No results for input(s): AMMONIA in the last 168 hours. Cardiac Enzymes: No results for input(s): CKTOTAL, CKMB, CKMBINDEX, TROPONINI in the last 168 hours. BNP (last 3 results) No results for input(s): BNP in the last 8760 hours.  ProBNP (last 3 results) No results for input(s): PROBNP in the last 8760 hours.  CBG: No results for input(s): GLUCAP in the last 168 hours.  Recent Results (from the past 240 hour(s))  MRSA PCR Screening     Status: None   Collection Time: 10/15/19 10:34 AM   Specimen: Nasal Mucosa; Nasopharyngeal  Result Value Ref Range Status   MRSA by PCR NEGATIVE NEGATIVE Final    Comment:        The GeneXpert MRSA Assay (FDA approved for NASAL specimens only), is one component of a comprehensive MRSA colonization surveillance program. It is not  intended to diagnose MRSA infection nor to guide or monitor treatment for MRSA infections. Performed at [facility], [place] [address] [place], [place] [address]      Studies: No results found.    Flora Lipps, MD  [facility] 10/25/2019

## 2019-10-25 NOTE — Progress Notes (Signed)
PT HYDROTHERAPY  TREATMENT NOTE  10/25/19 1400  Subjective Assessment  Subjective "Ow"  Patient and Family Stated Goals pt unable, no family present     Sacral wound is granulating, likely doesn't need santyl other than on small superficial area. Hydrotherapy goals met. No further need for PT/hydrotherapy intervention in acute setting.  Will d/c hydro at this time. SNF recommended for nursing care/wound care f/u.     Prior Treatments unsure (present on admission)    Evaluation and Treatment  Evaluation and Treatment Procedures Explained to Patient/Family Yes  Evaluation and Treatment Procedures Patient unable to consent due to mental status  Pressure Injury 10/15/19 Sacrum Mid Unstageable - Full thickness tissue loss in which the base of the injury is covered by slough (yellow, tan, gray, green or brown) and/or eschar (tan, brown or black) in the wound bed. *PT* sacral pressure injury, bone   Date First Assessed/Time First Assessed: 10/15/19 1510   Location: Sacrum  Location Orientation: Mid  Staging: Unstageable - Full thickness tissue loss in which the base of the injury is covered by slough (yellow, tan, gray, green or brown) and/or esc...  Dressing Type Foam - Lift dressing to assess site every shift;Moist to dry;Other (Comment)  Dressing Changed  Dressing Change Frequency Daily  State of Healing  (small superficial wound 60% slough, 40% gran)  Site / Wound Assessment Granulation tissue;Red;Other (Comment) (ligament/tendon visible)  % Wound base Red or Granulating 90%  % Wound base Other/Granulation Tissue (Comment) 10% (ligament/tendon)  Peri-wound Assessment Excoriated;Pink;Erythema (non-blanchable)  Wound Length (cm)  (see eval)  Margins Unattached edges (unapproximated)  Drainage Amount Minimal  Drainage Description Green;Other (Comment) (mild odor, green  tinged)  Treatment Hydrotherapy (Pulse lavage);Packing (Saline gauze)  Hydrotherapy  Pulsed lavage therapy - wound  location sacrum  Pulsed Lavage with Suction (psi) 8 psi  Pulsed Lavage with Suction - Normal Saline Used 1000 mL  Pulsed Lavage Tip Tip with splash shield  Selective Debridement  Selective Debridement - Location sacrum  Wound Therapy - Assess/Plan/Recommendations  Wound Therapy - Clinical Statement The  wound is much improved, increased granulation. Patient tolerates well. will see how wound is on 5/31 and consider discontinue hydrotherapy at this time   Wound Therapy - Functional Problem List grossly bed bound/immobile  Factors Delaying/Impairing Wound Healing Immobility;Multiple medical problems;Other (comment) (altered cognition)  Hydrotherapy Plan Other (comment) (d/c hydro)  Wound Therapy - Current Recommendations Other (comment) (f/u wound care at SNF)  Wound Therapy - Follow Up Recommendations Skilled nursing facility (for nursing care)  Wound Plan wound is clean and granulating, small anmount of greenish exudate noted today,  small superficial wound with small amount of slough, skin bridge will likely break down over time and wound may  be larger before further healing occurs  Wound Therapy Goals - Improve the function of patient's integumentary system by progressing the wound(s) through the phases of wound healing by:  Decrease Necrotic Tissue to 20  Decrease Necrotic Tissue - Progress Met  Increase Granulation Tissue - Progress Met  Improve Drainage Characteristics Min  Improve Drainage Characteristics - Progress Met  Goals/treatment plan/discharge plan were made with and agreed upon by patient/family No, Patient unable to participate in goals/treatment/discharge plan and family unavailable  Wound Therapy - Potential for Goals Lacey Cook, PT Acute Rehab Dept [facility]): [phone]   10/25/2019

## 2019-10-26 LAB — CBC
HCT: 32.8 % — ABNORMAL LOW (ref 36.0–46.0)
Hemoglobin: 10.5 g/dL — ABNORMAL LOW (ref 12.0–15.0)
MCH: 31 pg (ref 26.0–34.0)
MCHC: 32 g/dL (ref 30.0–36.0)
MCV: 96.8 fL (ref 80.0–100.0)
Platelets: 369 10*3/uL (ref 150–400)
RBC: 3.39 MIL/uL — ABNORMAL LOW (ref 3.87–5.11)
RDW: 17.4 % — ABNORMAL HIGH (ref 11.5–15.5)
WBC: 4.7 10*3/uL (ref 4.0–10.5)
nRBC: 0 % (ref 0.0–0.2)

## 2019-10-26 LAB — BASIC METABOLIC PANEL
Anion gap: 5 (ref 5–15)
BUN: 10 mg/dL (ref 8–23)
CO2: 27 mmol/L (ref 22–32)
Calcium: 7.4 mg/dL — ABNORMAL LOW (ref 8.9–10.3)
Chloride: 104 mmol/L (ref 98–111)
Creatinine, Ser: <0.3 mg/dL — ABNORMAL LOW (ref 0.44–1.00)
Glucose, Bld: 79 mg/dL (ref 70–99)
Potassium: 3.6 mmol/L (ref 3.5–5.1)
Sodium: 136 mmol/L (ref 135–145)

## 2019-10-26 LAB — SARS CORONAVIRUS 2 BY RT PCR (HOSPITAL ORDER, PERFORMED IN ~~LOC~~ HOSPITAL LAB): SARS Coronavirus 2: NEGATIVE

## 2019-10-26 LAB — MAGNESIUM: Magnesium: 2.1 mg/dL (ref 1.7–2.4)

## 2019-10-26 MED ORDER — ENSURE ENLIVE PO LIQD: 237.0000 mL | Freq: Three times a day (TID) | ORAL | 12 refills | Status: AC

## 2019-10-26 MED ORDER — ZINC SULFATE 220 (50 ZN) MG PO CAPS: 220.0000 mg | ORAL_CAPSULE | Freq: Every day | ORAL | Status: AC

## 2019-10-26 NOTE — Plan of Care (Signed)
  Problem: Nutrition: Goal: Adequate nutrition will be maintained Outcome: Progressing   Problem: Skin Integrity: Goal: Risk for impaired skin integrity will decrease Outcome: Progressing   

## 2019-10-26 NOTE — TOC Transition Note (Signed)
Transition of Care [facility]) - CM/SW Discharge Note   Patient Details  Name: Lacey Cook MRN: [id number] Date of Birth: 1955-10-04  Transition of Care [facility]) CM/SW Contact:  Dessa Phi, RN Phone Number: 10/26/2019, 3:47 PM   Clinical Narrative: PTAR called. covid results back. Nsg aware. No further CM needs.      Final next level of care: Skilled Nursing Facility Barriers to Discharge: Other (comment)(COVID)   Patient Goals and CMS Choice Patient states their goals for this hospitalization and ongoing recovery are:: go to rehab CMS Medicare.gov Compare Post Acute Care list provided to:: Patient Represenative (must comment)(dtr ronda)    Discharge Placement   Existing PASRR number confirmed : 10/20/19          Patient chooses bed at: [facility])        Discharge Plan and Services   Discharge Planning Services: CM Consult Post Acute Care Choice: Resumption of Svcs/PTA Provider                               Social Determinants of Health (SDOH) Interventions     Readmission Risk Interventions No flowsheet data found.

## 2019-10-26 NOTE — Progress Notes (Signed)
Report called and given to Gerald Stabs, RN at [facility].  IV removed - WNL.  Foley remained in place due to wounds.  Patient in NAD at this time, awaiting arrival of PTAR for transport

## 2019-10-26 NOTE — TOC Progression Note (Signed)
Transition of Care [facility]) - Progression Note    Patient Details  Name: Lacey Cook MRN: [id number] Date of Birth: [birth year]  Transition [facility]) CM/SW Contact  Harley Mccartney, Juliann Pulse, RN Phone Number: 10/26/2019, 3:31 PM  Clinical Narrative: awaiting covid results. Has a bed @ [facility] Angela aware sent d/c summary via hub. Going to rm 129A. PTAR to be called if results back by 4p.      Expected Discharge Plan: Skilled Nursing Facility Barriers to Discharge: Other (comment)(COVID)  Expected Discharge Plan and Services Expected Discharge Plan: [place]   Discharge Planning Services: CM Consult Post Acute Care Choice: Resumption of Svcs/PTA Provider Living arrangements for the past 2 months: Single Family Home Expected Discharge Date: 10/26/19                                     Social Determinants of Health (SDOH) Interventions    Readmission Risk Interventions No flowsheet data found.

## 2019-10-26 NOTE — Discharge Summary (Signed)
Physician Discharge Summary  Lacey Cook KDT:[id number] DOB: 06-29-1955 DOA: 10/15/2019  PCP: Richarda Osmond, DO  Admit date: 10/15/2019 Discharge date: 10/26/2019  Admitted From: Home  Discharge disposition: Skilled nursing facility  Recommendations for Outpatient Follow-Up:   . Follow up with your primary care provider in one week.  . Check CBC, BMP , magnesium in the next visit. . Continue wound care at the skilled nursing facility   Discharge Diagnosis:   Principal Problem:   Severe sepsis [facility]) Active Problems:   Asthma   Alzheimer's dementia ([place])   Infected pressure ulcer   Sepsis ([place])   Lactic acidosis   Inadequate oral intake   Hip pain, bilateral   Bilateral knee pain   Palliative care encounter   Obesity (BMI 30-39.9)    Discharge Condition: Improved.  Diet recommendation: Dysphagia 1 diet with thin liquids.  Advance as tolerated.  Wound care: Continue wound care at the skilled nursing facility.  Code status: Full.   History of Present Illness:   64 year old Cook PMH advanced dementia presented from home with failure to thrive. Admitted for sepsis secondary to multiple pressure ulcers.    Received hydrotherapy in hospitalization with improvement in granulation tissue.  Patient will continue wound care on discharge to skilled nursing facility.  Hospital Course:   Following conditions were addressed during hospitalization as listed below,  Severe sepsis secondary to multiple pressure ulcers.  Patient has completed course of antibiotics.  Sepsis physiology has resolved.  Completed hydrotherapy as per physical therapy.  Will need continued wound care on discharge.   Advanced Alzheimer's dementia with behavioral disturbance, contractures of the lower extremities --On Effexor, will continue.  Question diabetes mellitus type 2.  Hemoglobin A1c of 4.9 on 11/24/2018.  Closely monitor.  No need for oral hypoglycemic agents at this time.  Social  issue. Not able to be cared for at home.Plan for SNF.  Obesity unspecified.  Pressure ulceration present on admission. sacral unstageable ulceration.    Received hydrotherapy.  Disposition.  At this time, patient is stable for disposition to skilled nursing facility.  Spoke with the patient's daughter Ms. Lacey Cook on the phone and updated her about the  disposition to skilled nursing facility.  Medical Consultants:    Wound care, physical therapy  Palliative care  Procedures:    Hydrotherapy Subjective:   Today, patient was seen and examined at bedside.  Mostly nonverbal.  Alert awake.  No interval complaints reported  Discharge Exam:   Vitals:   10/25/19 2056 10/26/19 0450  BP: 110/65 111/63  Pulse: (!) 110 (!) 103  Resp: 18 16  Temp: 98.9 F (37.2 C) 98.7 F (37.1 C)  SpO2: 94% 93%   Vitals:   10/25/19 1325 10/25/19 1801 10/25/19 2056 10/26/19 0450  BP: 110/66 111/60 110/65 111/63  Pulse: (!) 115 (!) 113 (!) 110 (!) 103  Resp: 16 16 18 16   Temp: 98.8 F (37.1 C) 99.2 F (37.3 C) 98.9 F (37.2 C) 98.7 F (37.1 C)  TempSrc: Oral Oral Oral Oral  SpO2: 91% 95% 94% 93%  Weight:      Height:        General: Alert awake, not in obvious distress, nonverbal. HENT: pupils equally reacting to light,  No scleral pallor or icterus noted. Oral mucosa is moist.  Chest:  Clear breath sounds.  Diminished breath sounds bilaterally. No crackles or wheezes.  CVS: S1 &S2 heard. No murmur.  Regular rate and rhythm. Abdomen: Soft, nontender, nondistended.  Bowel sounds  are heard.   Extremities: No cyanosis, clubbing or edema.  Peripheral pulses are palpable. Psych: Alert, awake nonverbal. CNS: Alert awake but nonverbal. Skin: Sacral pressure ulceration present on admission-  unstageable  The results of significant diagnostics from this hospitalization (including imaging, microbiology, ancillary and laboratory) are listed below for reference.     Diagnostic Studies:    CT ABDOMEN PELVIS W CONTRAST  Result Date: 10/15/2019 CLINICAL DATA:  Assess extent of sacral wound EXAM: CT ABDOMEN AND PELVIS WITH CONTRAST TECHNIQUE: Multidetector CT imaging of the abdomen and pelvis was performed using the standard protocol following bolus administration of intravenous contrast. CONTRAST:  129mL OMNIPAQUE IOHEXOL 300 MG/ML  SOLN COMPARISON:  None. FINDINGS: Lower chest:  No contributory findings. Hepatobiliary: No focal liver abnormality.No evidence of biliary obstruction or stone. Pancreas: Unremarkable pancreas. Small duodenal diverticulum at the pancreatic head. Spleen: Unremarkable. Adrenals/Urinary Tract: Negative adrenals. No hydronephrosis or stone. Unremarkable bladder. Stomach/Bowel:  No obstruction. No appendicitis. Vascular/Lymphatic: No acute vascular abnormality. No mass or adenopathy. Reproductive:Unremarkable for age Other: No ascites or pneumoperitoneum. Musculoskeletal: Sacral decubitus ulcer is seen over the lower sacrum with tracking leftward in the subcutaneous fat by 2 cm. No collection or bony erosion seen. Severe right hip osteoarthritis. Diffuse lumbar and lower thoracic degenerative disc narrowing with lumbar facet arthritis and L4-5, L5-S1 anterolisthesis. IMPRESSION: 1. Lower sacral decubitus ulcer with leftward subcutaneous tracking by 2 cm. No collection or visible osteomyelitis. 2. Advanced right hip and lumbar spine degeneration. Electronically Signed   By: Monte Fantasia M.D.   On: 10/15/2019 04:49   DG Chest Port 1 View  Result Date: 10/15/2019 CLINICAL DATA:  Tachycardia EXAM: PORTABLE CHEST 1 VIEW COMPARISON:  07/20/2018 FINDINGS: The patient's chin obscures the apices. Hazy left lung opacity possibly due to rotation and soft tissue. No consolidation, pleural effusion or pneumothorax. IMPRESSION: Mild asymmetric hazy left thoracic opacity, suspected to be secondary to rotation and overlying soft tissues. No consolidative airspace disease.  Electronically Signed   By: Donavan Foil M.D.   On: 10/15/2019 02:39     Labs:   Basic Metabolic Panel: Recent Labs  Lab 10/19/19 1509 10/19/19 1509 10/20/19 7564 10/20/19 3329 10/20/19 5188 10/21/19 0515 10/21/19 0515 10/22/19 0520 10/25/19 0438 10/26/19 0407  NA 136  --  134*  --   --  137  --   --  137 136  K 3.1*   < > 2.9*   < >  --  3.6   < >  --  3.9 3.6  CL 105  --  105  --   --  103  --   --  105 104  CO2 24  --  23  --   --  24  --   --  26 27  GLUCOSE 96  --  93  --   --  118*  --   --  92 79  BUN 11  --  7*  --   --  13  --   --  10 10  CREATININE <0.30*   < > <0.30*  --   --  <0.30*  --  0.31* <0.30* <0.30*  CALCIUM 7.6*  --  7.3*  --   --  7.4*  --   --  7.3* 7.4*  MG  --   --   --   --  1.7  --   --   --   --  2.1   < > = values in this interval  not displayed.   GFR CrCl cannot be calculated (This lab value cannot be used to calculate CrCl because it is not a number: <0.30). Liver Function Tests: No results for input(s): AST, ALT, ALKPHOS, BILITOT, PROT, ALBUMIN in the last 168 hours. No results for input(s): LIPASE, AMYLASE in the last 168 hours. No results for input(s): AMMONIA in the last 168 hours. Coagulation profile No results for input(s): INR, PROTIME in the last 168 hours.  CBC: Recent Labs  Lab 10/20/19 0452 10/21/19 0515 10/25/19 0438 10/26/19 0407  WBC 8.1 7.0 5.4 4.7  HGB 11.1* 11.0* 10.2* 10.5*  HCT 33.2* 33.5* Lacey.0* Lacey.8*  MCV 92.7 94.9 97.0 96.8  PLT 263 307 387 369   Cardiac Enzymes: No results for input(s): CKTOTAL, CKMB, CKMBINDEX, TROPONINI in the last 168 hours. BNP: Invalid input(s): POCBNP CBG: No results for input(s): GLUCAP in the last 168 hours. D-Dimer No results for input(s): DDIMER in the last 72 hours. Hgb A1c No results for input(s): HGBA1C in the last 72 hours. Lipid Profile No results for input(s): CHOL, HDL, LDLCALC, TRIG, CHOLHDL, LDLDIRECT in the last 72 hours. Thyroid function studies No results for  input(s): TSH, T4TOTAL, T3FREE, THYROIDAB in the last 72 hours.  Invalid input(s): FREET3 Anemia work up No results for input(s): VITAMINB12, FOLATE, FERRITIN, TIBC, IRON, RETICCTPCT in the last 72 hours. Microbiology No results found for this or any previous visit (from the past 240 hour(s)).   Discharge Instructions:   Discharge Instructions    DIET - DYS 1   Complete by: As directed    Fluid consistency: Thin   Discharge instructions   Complete by: As directed    Follow-up with the primary care provider at the skilled nursing facility in 3 to 5 days.  Continue wound care at the facility.   Increase activity slowly   Complete by: As directed      Allergies as of 10/26/2019      Reactions   Shellfish Allergy Anaphylaxis   Throat swells   Sulfa Antibiotics Anaphylaxis   Throat Swelling      Medication List    TAKE these medications   acetaminophen 500 MG tablet Commonly known as: TYLENOL Take 1,000 mg by mouth every 6 (six) hours as needed for moderate pain.   albuterol 108 (90 Base) MCG/ACT inhaler Commonly known as: VENTOLIN HFA Inhale 2 puffs into the lungs every 6 (six) hours as needed for wheezing or shortness of breath.   ascorbic acid 500 MG tablet Commonly known as: VITAMIN C Take 500 mg by mouth daily.   feeding supplement (ENSURE ENLIVE) Liqd Take 237 mLs by mouth 3 (three) times daily between meals.   feeding supplement (PRO-STAT SUGAR FREE 64) Liqd Take 30 mLs by mouth daily.   HYDROcodone-acetaminophen 5-325 MG tablet Commonly known as: NORCO/VICODIN Take 1-2 tablets by mouth every 4 (four) hours as needed for moderate pain.   multivitamin with minerals tablet Take 1 tablet by mouth daily.   venlafaxine 75 MG tablet Commonly known as: EFFEXOR Take 1 tablet (75 mg total) by mouth 2 (two) times daily.   zinc sulfate 220 (50 Zn) MG capsule Take 1 capsule (220 mg total) by mouth daily. Start taking on: October 27, 2019      Contact information  for after-discharge care    Destination    [facility] .   Service: Skilled Nursing Contact information: [address] [place] [phone]  Time coordinating discharge: 39 minutes  Signed:  Kala Gassmann  [facility] 10/26/2019, 2:11 PM

## 2021-02-02 IMAGING — CT CT ABD-PELV W/ CM
2 of 7 series · 14 of 46 positions shown, 19 images · IV contrast (OMNIPAQUE 300)
Comparison: None.

CLINICAL DATA: Assess extent of sacral wound

EXAM:
CT ABDOMEN AND PELVIS WITH CONTRAST
TECHNIQUE: Multidetector CT imaging of the abdomen and pelvis was performed
using the standard protocol following bolus administration of
intravenous contrast.
CONTRAST:  100mL OMNIPAQUE IOHEXOL 300 MG/ML  SOLN

[Series 2: axial st · axial · 0.75mm/px · z∈[-737,-377]mm · 11 of 84 slices shown, 16 images]
[im 6/84  soft-tissue]
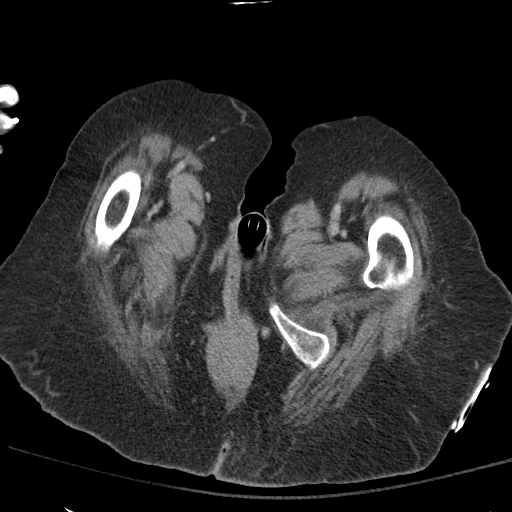
[im 6/84  bone]
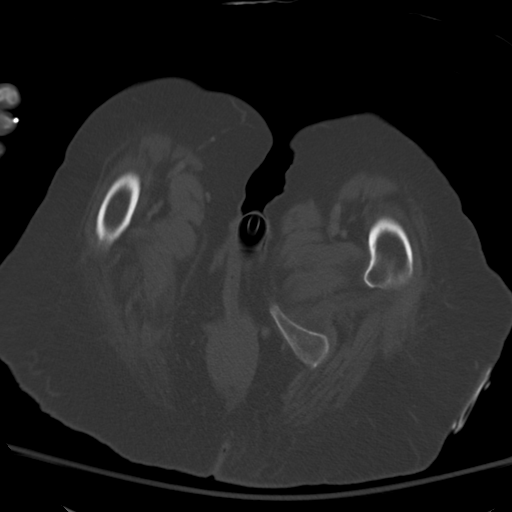
[im 16/84  soft-tissue]
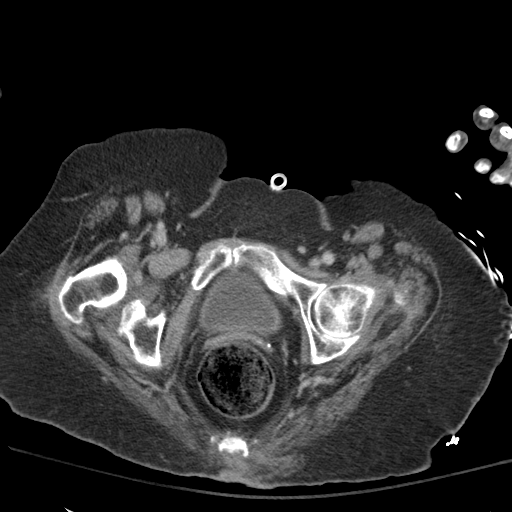
[im 21/84  soft-tissue]
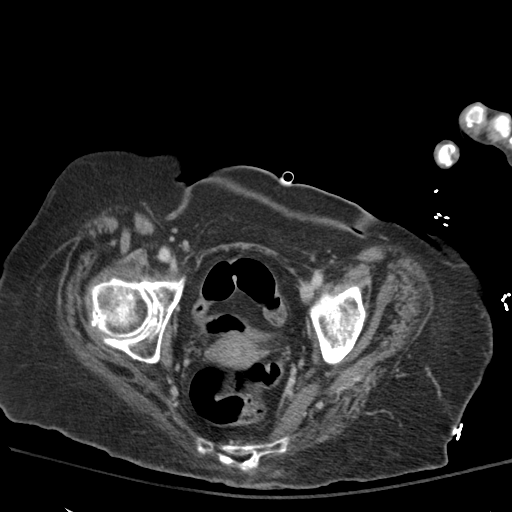
[im 32/84  soft-tissue]
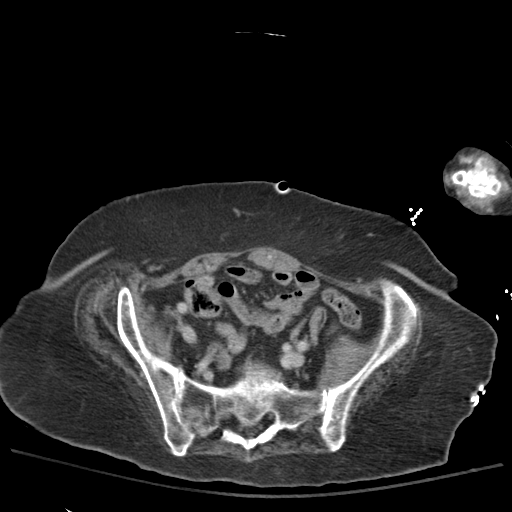
[im 37/84  soft-tissue]
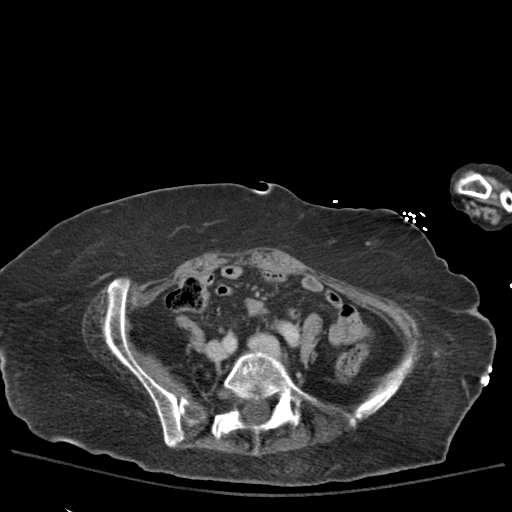
[im 47/84  soft-tissue]
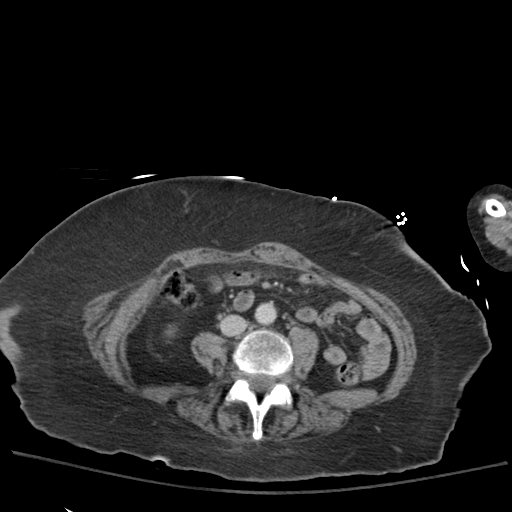
[im 52/84  soft-tissue]
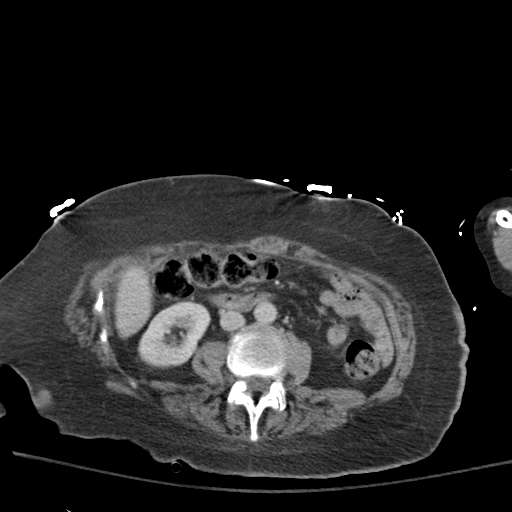
[im 63/84  soft-tissue]
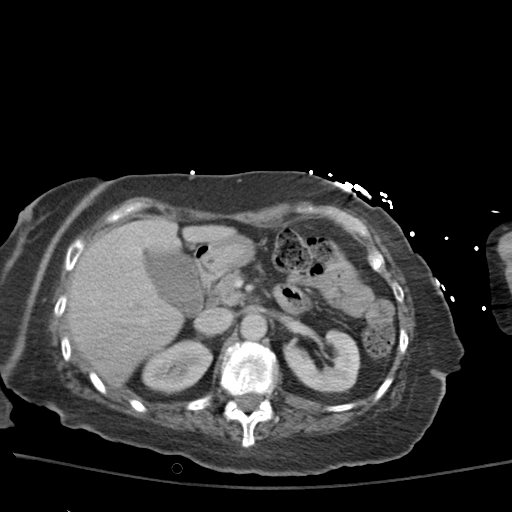
[im 63/84  lung]
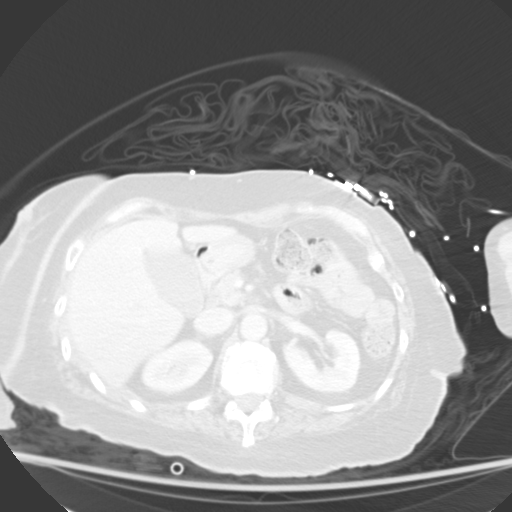
[im 68/84  soft-tissue]
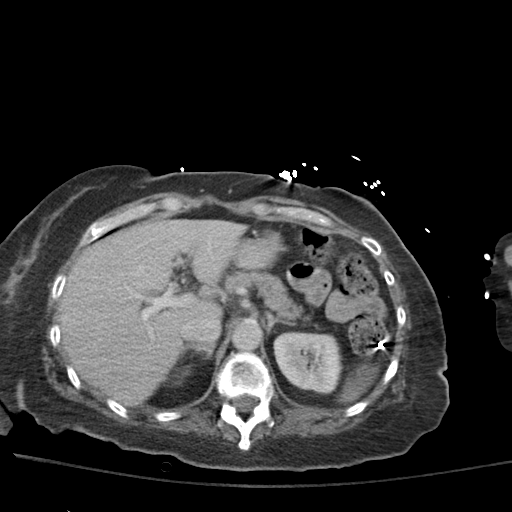
[im 68/84  lung]
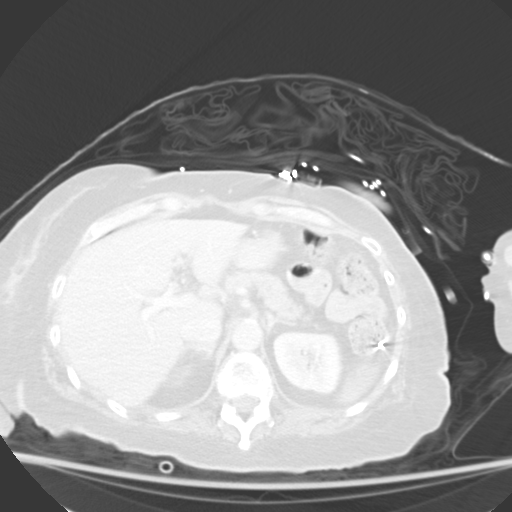
[im 68/84  bone]
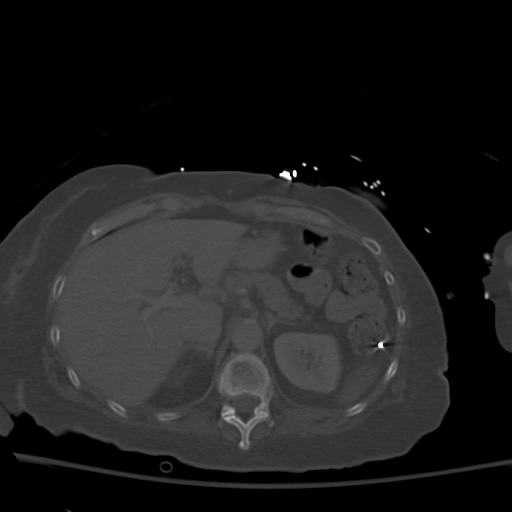
[im 73/84  lung]
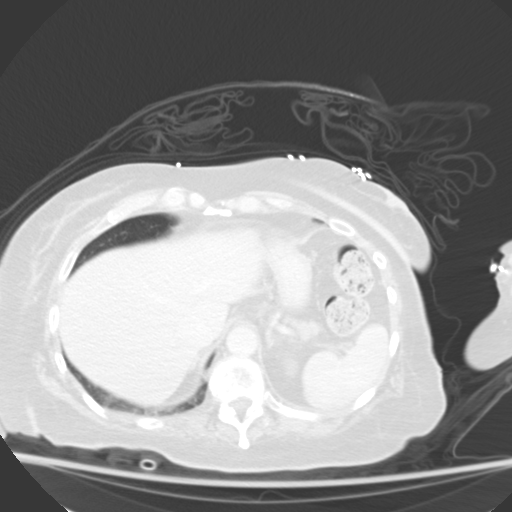
[im 78/84  soft-tissue]
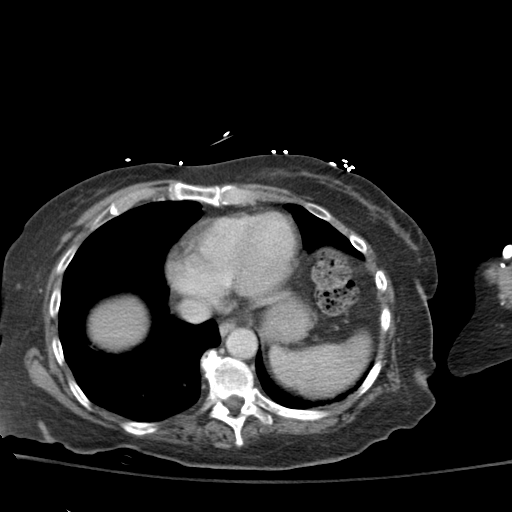
[im 78/84  lung]
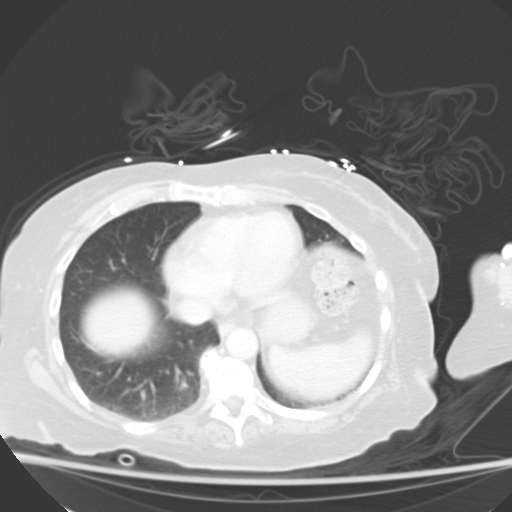

[Series 4: coronal st · coronal · 0.73mm/px · 3 of 121 slices shown]
[im 41/121  soft-tissue]
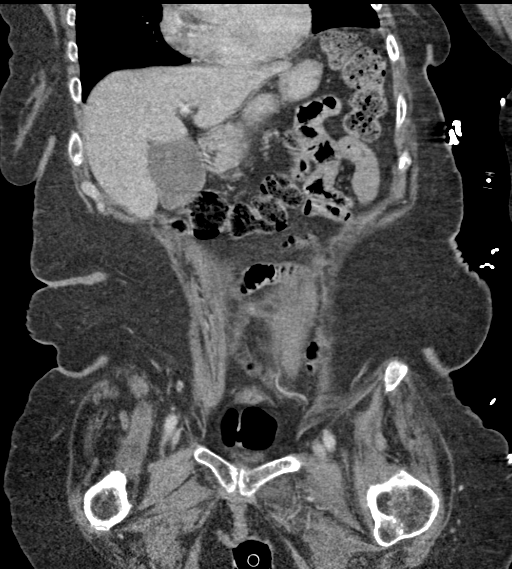
[im 54/121  soft-tissue]
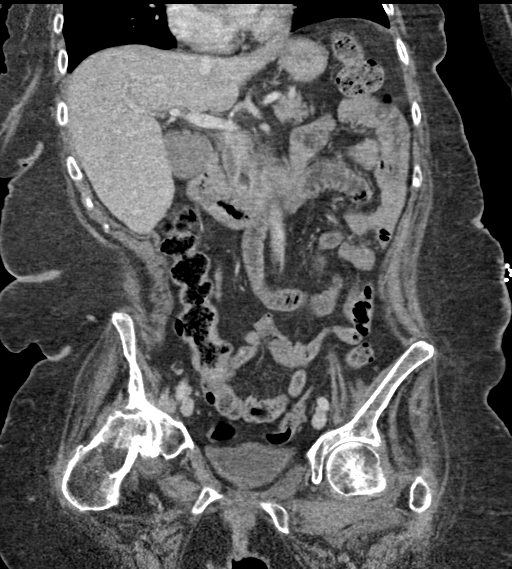
[im 67/121  soft-tissue]
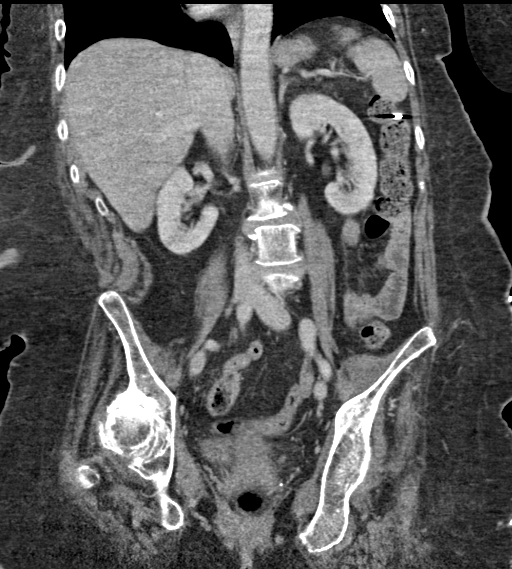

[14 of 46 positions shown; findings below may reference images not displayed]

FINDINGS: Lower chest:  No contributory findings.

Hepatobiliary: No focal liver abnormality.No evidence of biliary
obstruction or stone.

Pancreas: Unremarkable pancreas. Small duodenal diverticulum at the
pancreatic head.

Spleen: Unremarkable.

Adrenals/Urinary Tract: Negative adrenals. No hydronephrosis or
stone. Unremarkable bladder.

Stomach/Bowel:  No obstruction. No appendicitis.

Vascular/Lymphatic: No acute vascular abnormality. No mass or
adenopathy.

Reproductive:Unremarkable for age

Other: No ascites or pneumoperitoneum.

Musculoskeletal: Sacral decubitus ulcer is seen over the lower
sacrum with tracking leftward in the subcutaneous fat by 2 cm. No
collection or bony erosion seen.

Severe right hip osteoarthritis. Diffuse lumbar and lower thoracic
degenerative disc narrowing with lumbar facet arthritis and L4-5,
L5-S1 anterolisthesis.
IMPRESSION: 1. Lower sacral decubitus ulcer with leftward subcutaneous tracking
by 2 cm. No collection or visible osteomyelitis.
2. Advanced right hip and lumbar spine degeneration.

## 2021-10-25 DEATH — deceased
